# Patient Record
Sex: Male | Born: 1960 | Race: White | Hispanic: No | Marital: Single | Smoking: Current every day smoker
Health system: Southern US, Community
[De-identification: ages and names within clinical notes are randomized; demographics above are authoritative.]

## PROBLEM LIST (undated history)

## (undated) DIAGNOSIS — J45909 Unspecified asthma, uncomplicated: Secondary | ICD-10-CM

## (undated) DIAGNOSIS — J219 Acute bronchiolitis, unspecified: Secondary | ICD-10-CM

## (undated) DIAGNOSIS — R0602 Shortness of breath: Secondary | ICD-10-CM

## (undated) DIAGNOSIS — J449 Chronic obstructive pulmonary disease, unspecified: Secondary | ICD-10-CM

## (undated) DIAGNOSIS — J439 Emphysema, unspecified: Secondary | ICD-10-CM

## (undated) HISTORY — PX: CYST EXCISION: SHX5701

## (undated) HISTORY — DX: Acute bronchiolitis, unspecified: J21.9

## (undated) HISTORY — DX: Emphysema, unspecified: J43.9

---

## 2002-04-12 ENCOUNTER — Emergency Department (HOSPITAL_COMMUNITY): Admission: EM | Admit: 2002-04-12 | Discharge: 2002-04-12 | Payer: Self-pay | Admitting: Emergency Medicine

## 2002-04-12 ENCOUNTER — Encounter: Payer: Self-pay | Admitting: Emergency Medicine

## 2005-05-27 ENCOUNTER — Ambulatory Visit (HOSPITAL_COMMUNITY): Admission: RE | Admit: 2005-05-27 | Discharge: 2005-05-27 | Payer: Self-pay | Admitting: Family Medicine

## 2006-10-19 ENCOUNTER — Emergency Department (HOSPITAL_COMMUNITY): Admission: EM | Admit: 2006-10-19 | Discharge: 2006-10-19 | Payer: Self-pay | Admitting: Emergency Medicine

## 2007-07-01 ENCOUNTER — Emergency Department (HOSPITAL_COMMUNITY): Admission: EM | Admit: 2007-07-01 | Discharge: 2007-07-01 | Payer: Self-pay | Admitting: Emergency Medicine

## 2007-07-01 ENCOUNTER — Encounter: Payer: Self-pay | Admitting: Orthopedic Surgery

## 2007-07-06 ENCOUNTER — Ambulatory Visit: Payer: Self-pay | Admitting: Orthopedic Surgery

## 2007-07-06 DIAGNOSIS — S62639B Displaced fracture of distal phalanx of unspecified finger, initial encounter for open fracture: Secondary | ICD-10-CM

## 2007-07-09 ENCOUNTER — Ambulatory Visit: Payer: Self-pay | Admitting: Orthopedic Surgery

## 2007-08-10 ENCOUNTER — Ambulatory Visit: Payer: Self-pay | Admitting: Orthopedic Surgery

## 2008-10-04 ENCOUNTER — Emergency Department (HOSPITAL_COMMUNITY): Admission: EM | Admit: 2008-10-04 | Discharge: 2008-10-04 | Payer: Self-pay | Admitting: Emergency Medicine

## 2008-10-06 ENCOUNTER — Emergency Department (HOSPITAL_COMMUNITY): Admission: EM | Admit: 2008-10-06 | Discharge: 2008-10-06 | Payer: Self-pay | Admitting: Emergency Medicine

## 2010-03-02 ENCOUNTER — Emergency Department (HOSPITAL_COMMUNITY): Admission: EM | Admit: 2010-03-02 | Discharge: 2010-03-02 | Payer: Self-pay | Admitting: Emergency Medicine

## 2010-07-14 ENCOUNTER — Encounter: Payer: Self-pay | Admitting: Family Medicine

## 2010-09-06 LAB — URINALYSIS, ROUTINE W REFLEX MICROSCOPIC
Glucose, UA: NEGATIVE mg/dL
Ketones, ur: NEGATIVE mg/dL
Nitrite: NEGATIVE
Specific Gravity, Urine: 1.005 (ref 1.005–1.030)
pH: 6.5 (ref 5.0–8.0)

## 2010-09-06 LAB — URINE MICROSCOPIC-ADD ON

## 2011-02-01 ENCOUNTER — Emergency Department (HOSPITAL_COMMUNITY)
Admission: EM | Admit: 2011-02-01 | Discharge: 2011-02-01 | Disposition: A | Discharge status: Home / Self Care | Payer: Self-pay | Attending: Emergency Medicine | Admitting: Emergency Medicine

## 2011-02-01 ENCOUNTER — Emergency Department (HOSPITAL_COMMUNITY): Payer: Self-pay

## 2011-02-01 DIAGNOSIS — IMO0002 Reserved for concepts with insufficient information to code with codable children: Secondary | ICD-10-CM | POA: Insufficient documentation

## 2011-02-01 DIAGNOSIS — S2239XA Fracture of one rib, unspecified side, initial encounter for closed fracture: Secondary | ICD-10-CM | POA: Insufficient documentation

## 2011-02-01 DIAGNOSIS — F17200 Nicotine dependence, unspecified, uncomplicated: Secondary | ICD-10-CM | POA: Insufficient documentation

## 2011-02-01 MED ORDER — OXYCODONE-ACETAMINOPHEN 5-325 MG PO TABS: 2.0000 | ORAL_TABLET | ORAL | Status: AC | PRN

## 2011-02-01 MED ORDER — HYDROMORPHONE HCL 2 MG/ML IJ SOLN
2.0000 mg | Freq: Once | INTRAMUSCULAR | Status: AC
  Administered 2011-02-01: 2 mg via INTRAMUSCULAR
  Filled 2011-02-01: qty 1

## 2011-02-01 MED ORDER — ALBUTEROL SULFATE HFA 108 (90 BASE) MCG/ACT IN AERS: 2.0000 | INHALATION_SPRAY | RESPIRATORY_TRACT | Status: DC | PRN

## 2011-02-01 MED ORDER — ALBUTEROL SULFATE HFA 108 (90 BASE) MCG/ACT IN AERS
2.0000 | INHALATION_SPRAY | Freq: Once | RESPIRATORY_TRACT | Status: AC
  Administered 2011-02-01: 2 via RESPIRATORY_TRACT
  Filled 2011-02-01: qty 6.7
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
## 2011-03-07 ENCOUNTER — Emergency Department (HOSPITAL_COMMUNITY): Payer: Medicaid Other

## 2011-03-07 ENCOUNTER — Other Ambulatory Visit: Payer: Self-pay

## 2011-03-07 ENCOUNTER — Emergency Department (HOSPITAL_COMMUNITY)
Admission: EM | Admit: 2011-03-07 | Discharge: 2011-03-07 | Disposition: A | Discharge status: Home / Self Care | Payer: Medicaid Other | Attending: Emergency Medicine | Admitting: Emergency Medicine

## 2011-03-07 ENCOUNTER — Encounter (HOSPITAL_COMMUNITY): Payer: Self-pay | Admitting: Emergency Medicine

## 2011-03-07 DIAGNOSIS — R059 Cough, unspecified: Secondary | ICD-10-CM | POA: Insufficient documentation

## 2011-03-07 DIAGNOSIS — I4510 Unspecified right bundle-branch block: Secondary | ICD-10-CM | POA: Insufficient documentation

## 2011-03-07 DIAGNOSIS — R079 Chest pain, unspecified: Secondary | ICD-10-CM | POA: Insufficient documentation

## 2011-03-07 DIAGNOSIS — R0602 Shortness of breath: Secondary | ICD-10-CM | POA: Insufficient documentation

## 2011-03-07 DIAGNOSIS — Z87891 Personal history of nicotine dependence: Secondary | ICD-10-CM | POA: Insufficient documentation

## 2011-03-07 DIAGNOSIS — R05 Cough: Secondary | ICD-10-CM | POA: Insufficient documentation

## 2011-03-07 MED ORDER — OXYCODONE-ACETAMINOPHEN 5-325 MG PO TABS: 2.0000 | ORAL_TABLET | ORAL | Status: AC | PRN

## 2011-03-07 MED ORDER — KETOROLAC TROMETHAMINE 60 MG/2ML IM SOLN
60.0000 mg | Freq: Once | INTRAMUSCULAR | Status: AC
  Administered 2011-03-07: 60 mg via INTRAMUSCULAR
  Filled 2011-03-07: qty 2

## 2011-03-07 NOTE — ED Notes (Signed)
Pt medicated for pain. D/c instructions administered. NAD.
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
## 2011-07-18 ENCOUNTER — Encounter (HOSPITAL_COMMUNITY): Payer: Self-pay

## 2011-07-18 ENCOUNTER — Emergency Department (HOSPITAL_COMMUNITY): Payer: Medicaid Other

## 2011-07-18 ENCOUNTER — Emergency Department (HOSPITAL_COMMUNITY)
Admission: EM | Admit: 2011-07-18 | Discharge: 2011-07-18 | Disposition: A | Discharge status: Home / Self Care | Payer: Medicaid Other | Attending: Emergency Medicine | Admitting: Emergency Medicine

## 2011-07-18 DIAGNOSIS — J441 Chronic obstructive pulmonary disease with (acute) exacerbation: Secondary | ICD-10-CM | POA: Insufficient documentation

## 2011-07-18 DIAGNOSIS — F17200 Nicotine dependence, unspecified, uncomplicated: Secondary | ICD-10-CM | POA: Insufficient documentation

## 2011-07-18 DIAGNOSIS — IMO0001 Reserved for inherently not codable concepts without codable children: Secondary | ICD-10-CM

## 2011-07-18 MED ORDER — KETOROLAC TROMETHAMINE 30 MG/ML IJ SOLN
30.0000 mg | Freq: Once | INTRAMUSCULAR | Status: AC
  Administered 2011-07-18: 30 mg via INTRAMUSCULAR
  Filled 2011-07-18: qty 1

## 2011-07-18 MED ORDER — ALBUTEROL SULFATE HFA 108 (90 BASE) MCG/ACT IN AERS
1.0000 | INHALATION_SPRAY | Freq: Four times a day (QID) | RESPIRATORY_TRACT | Status: DC | PRN
  Administered 2011-07-18: 2 via RESPIRATORY_TRACT
  Filled 2011-07-18: qty 6.7

## 2011-07-18 MED ORDER — IPRATROPIUM BROMIDE 0.02 % IN SOLN
0.5000 mg | Freq: Once | RESPIRATORY_TRACT | Status: AC
  Administered 2011-07-18: 0.5 mg via RESPIRATORY_TRACT

## 2011-07-18 MED ORDER — GUAIFENESIN-CODEINE 100-10 MG/5ML PO SYRP: 5.0000 mL | ORAL_SOLUTION | Freq: Three times a day (TID) | ORAL | Status: AC | PRN

## 2011-07-18 MED ORDER — PREDNISONE 20 MG PO TABS: 40.0000 mg | ORAL_TABLET | Freq: Every day | ORAL | Status: AC

## 2011-07-18 MED ORDER — PREDNISONE 20 MG PO TABS
40.0000 mg | ORAL_TABLET | Freq: Once | ORAL | Status: AC
  Administered 2011-07-18: 40 mg via ORAL
  Filled 2011-07-18: qty 2

## 2011-07-18 MED ORDER — ALBUTEROL SULFATE (5 MG/ML) 0.5% IN NEBU
5.0000 mg | INHALATION_SOLUTION | Freq: Once | RESPIRATORY_TRACT | Status: AC
  Administered 2011-07-18: 5 mg via RESPIRATORY_TRACT
  Filled 2011-07-18: qty 0.5

## 2011-07-18 MED ORDER — IPRATROPIUM BROMIDE 0.02 % IN SOLN
0.5000 mg | Freq: Once | RESPIRATORY_TRACT | Status: AC
  Administered 2011-07-18: 0.5 mg via RESPIRATORY_TRACT
  Filled 2011-07-18: qty 2.5

## 2011-07-18 MED ORDER — IPRATROPIUM-ALBUTEROL 18-103 MCG/ACT IN AERO
1.0000 | INHALATION_SPRAY | Freq: Four times a day (QID) | RESPIRATORY_TRACT | Status: DC
  Administered 2011-07-18: 1 via RESPIRATORY_TRACT
  Filled 2011-07-18: qty 14.7

## 2011-07-18 MED ORDER — ALBUTEROL SULFATE (5 MG/ML) 0.5% IN NEBU
2.5000 mg | INHALATION_SOLUTION | Freq: Once | RESPIRATORY_TRACT | Status: AC
  Administered 2011-07-18: 2.5 mg via RESPIRATORY_TRACT
  Filled 2011-07-18: qty 0.5

## 2011-07-18 NOTE — ED Notes (Signed)
IV d/c'd to left AC, catheter intact, site WNL, pt states that EMS had placed IV en route

## 2011-07-18 NOTE — ED Notes (Signed)
Pt states [facility] to be eval for a cough he has had for weeks. Was sent here for further eval. Pt also complain of low back pain from cough

## 2011-07-18 NOTE — ED Provider Notes (Signed)
History   This chart was scribed for Gavin Pound. Branden Vine, MD scribed by Magnus Sinning. The patient was seen in room APA18/APA18 seen at 926    CSN: [id number]  Arrival date & time 07/18/11  0909   First MD Initiated Contact with Patient 07/18/11 [phone]      Chief Complaint  Patient presents with  . Shortness of Breath    (Consider location/radiation/quality/duration/timing/severity/associated sxs/prior treatment) HPI Tony [name] is a 51 y.o. male who presents to the Emergency Department complaining of gradually worsening moderate SOB, onset a couple weeks ago with associated productive cough, fever, chills, diaphoresis, weight loss, neck pain, and back pain. Pt denies vomiting, or appetite changes. He says he has had difficulty sleeping due to SOB and explains that he has had mild SOB for several months, but that it has gotten worse within the last week. Explains SOB is aggravated with activity and movement. Pt also notes a knot located on the right posterior neck that he states he has had since he was 18. Pt says that it was removed and went away, but has since come back. Denies that he's ever been told that he has COPD, Emphysema , or asthma. He states he smokes one pack a day and is an occasional drinker.   History reviewed. No pertinent past medical history.  History reviewed. No pertinent past surgical history.  Family History  Problem Relation Age of Onset  . COPD Brother     History  Substance Use Topics  . Smoking status: Current Everyday Smoker -- 1.0 packs/day    Types: Cigarettes  . Smokeless tobacco: Not on file  . Alcohol Use: 0.0 oz/week     very occasional      Review of Systems 10 Systems reviewed and are negative for acute change except as noted in the HPI. Allergies  Review of patient's allergies indicates no known allergies.  Home Medications   Current Outpatient Rx  Name Route Sig Dispense Refill  . GUAIFENESIN-CODEINE 100-10 MG/5ML PO SYRP Oral Take  5 mLs by mouth 3 (three) times daily as needed for cough. 120 mL 0    BP 122/88  Pulse 78  Temp(Src) 97.8 F (36.6 C) (Oral)  Resp 22  Ht 5\' 6"  (1.676 m)  Wt 144 lb (65.318 kg)  BMI 23.24 kg/m2  SpO2 98%  Physical Exam  Nursing note and vitals reviewed. Constitutional: He is oriented to person, place, and time. He appears well-developed and well-nourished. No distress.  HENT:  Head: Normocephalic and atraumatic.  Eyes: EOM are normal. Pupils are equal, round, and reactive to light.  Neck: Normal range of motion. Neck supple. No tracheal deviation present.       Soft round tissue about 2.5 cm in diameter on posterior right neck. It is not red. It is not indurated and does not feel fluctuant.   Cardiovascular: Normal rate, regular rhythm and normal heart sounds.   Pulmonary/Chest: Effort normal. No respiratory distress. He has wheezes.       Very little air movement diffusely. Moving some air in the left upper lobe. Accessory muscle use and pt tachypneic   Abdominal: Soft. He exhibits no distension.  Musculoskeletal: Normal range of motion. He exhibits no edema.  Neurological: He is alert and oriented to person, place, and time. No sensory deficit.  Skin: Skin is warm and dry.  Psychiatric: He has a normal mood and affect. His behavior is normal.    ED Course  Procedures (including critical care time)  DIAGNOSTIC STUDIES: Oxygen Saturation is 98% on room air, normal by my interpretation.    COORDINATION OF CARE: 9:45- Pt given albuterol (5 MG/ML) 0.5% nebulizer solution 2.5 mg 10:00- Pt given predniSONE  tablet 40 mg, ipratropium nebulizer solution 0.5  10:20- Pt given ipratropium  nebulizer solution 0.5 mg and albuterol  0.5% nebulizer solution 5 mg  10:26- Pt given albuterol inhaler 1-2 puff  10:45-Pt given ketorolac 30 MG/ML injection 30 mg 12:00- Pt given albuterol-ipratropium inhaler 1 puff   Labs Reviewed - No data to display Dg Chest 2 View  07/18/2011  *RADIOLOGY  REPORT*  Clinical Data: Shortness of breath, smoker  CHEST - 2 VIEW  Comparison: 03/07/2011  Findings: Normal heart size, mediastinal contours, and pulmonary vascularity. Emphysematous and bronchitic changes. No pulmonary infiltrate, pleural effusion, or pneumothorax. Old fracture posterolateral right 9th rib fracture. Bones demineralized. Question left nipple shadow.  IMPRESSION: Emphysematous and bronchitic changes compatible with COPD. Question left nipple shadow; recommend repeat PA chest radiograph with nipple markers to exclude pulmonary nodule.  Original Report Authenticated By: Lollie Marrow, M.D.   Dg Chest Special View  07/18/2011  *RADIOLOGY REPORT*  Clinical Data: Repeat imaging with nipple markers performed  CHEST SPECIAL VIEW  Comparison: 07/18/2011  Findings: Heart size is normal.  No pleural effusion or pulmonary edema.  Interstitial changes of COPD identified.  The nodular density in the left base does not correspond to nipple artifact.  This is indeterminate.  No airspace consolidation identified.  IMPRESSION:  1.  Indeterminate nodular density in the left base which does not correspond to nipple artifact.  More definitive assessment with noncontrast CT of the chest advised.  These results will be called to the ordering clinician or representative by the Radiologist Assistant, and communication documented in the PACS Dashboard.  Original Report Authenticated By: Rosealee Albee, M.D.     1. COPD bronchitis       MDM  I personally performed the services described in this documentation, which was scribed in my presence. The recorded information has been reviewed and considered.     Patient likely with undiagnosed COPD. Patient reports that he has little money and no insurance therefore his never had a primary care physician. He has been a one to 2 pack per day smoker since age of 83. He reports that he has felt short of breath progressively worse for several months, but has gotten  worse the last few days. He reports coughing as gotten worse with occasional yellow or greenish sputum production. He denies night sweats or obvious weight loss. He denies any significant chest pain other than when he is coughing. He denies any poor appetite. He has felt hot and cold with occasional sweats so endorses some subjective fevers. My plan is to administer 2 or 3 that it was her treatments, oral steroids, and make sure that he does have inhalers for home. His room air saturations are 98% which is normal. The chest x-ray which I also reviewed per the radiologist shows bronchitic changes compatible with COPD. He does recommend a repeat with nipple markers to make sure that there are no nodules there. Otherwise if his symptoms seem improved after steroids and breathing treatments, I feel that he would be stable for discharge to home. I will refer him to Dr. Lodema Hong who is on call for medical. I do feel that he requires at least one close followup appointment to ensure that this acute exacerbation is improving and that he may have the  opportunity to establish with a local internist for ongoing primary care.     11:20 AM  Pt feels much imrpoved.  Will d/c home.    Gavin Pound. Theressa Piedra, MD 07/18/11 1120

## 2011-07-18 NOTE — Discharge Instructions (Signed)
Bronchitis Bronchitis is the body's way of reacting to injury and/or infection (inflammation) of the bronchi. Bronchi are the air tubes that extend from the windpipe into the lungs. If the inflammation becomes severe, it may cause shortness of breath. CAUSES  Inflammation may be caused by:  A virus.   Germs (bacteria).   Dust.   Allergens.   Pollutants and many other irritants.  The cells lining the bronchial tree are covered with tiny hairs (cilia). These constantly beat upward, away from the lungs, toward the mouth. This keeps the lungs free of pollutants. When these cells become too irritated and are unable to do their job, mucus begins to develop. This causes the characteristic cough of bronchitis. The cough clears the lungs when the cilia are unable to do their job. Without either of these protective mechanisms, the mucus would settle in the lungs. Then you would develop pneumonia. Smoking is a common cause of bronchitis and can contribute to pneumonia. Stopping this habit is the single most important thing you can do to help yourself. TREATMENT   Your caregiver may prescribe an antibiotic if the cough is caused by bacteria. Also, medicines that open up your airways make it easier to breathe. Your caregiver may also recommend or prescribe an expectorant. It will loosen the mucus to be coughed up. Only take over-the-counter or prescription medicines for pain, discomfort, or fever as directed by your caregiver.   Removing whatever causes the problem (smoking, for example) is critical to preventing the problem from getting worse.   Cough suppressants may be prescribed for relief of cough symptoms.   Inhaled medicines may be prescribed to help with symptoms now and to help prevent problems from returning.   For those with recurrent (chronic) bronchitis, there may be a need for steroid medicines.  SEEK IMMEDIATE MEDICAL CARE IF:   During treatment, you develop more pus-like mucus  (purulent sputum).   You have a fever.   You become progressively more ill.   You have increased difficulty breathing, wheezing, or shortness of breath.  It is necessary to seek immediate medical care if you are elderly or sick from any other disease. MAKE SURE YOU:   Understand these instructions.   Will watch your condition.   Will get help right away if you are not doing well or get worse.  Document Released: 06/10/2005 Document Revised: 02/20/2011 Document Reviewed: 04/19/2008 [employer] Patient Information 2012 [employer] 

## 2011-07-18 NOTE — ED Notes (Signed)
Pt receiving neb treatment at this time

## 2011-07-18 NOTE — ED Notes (Signed)
Pt req pain med for back pain. edp aware. Med ordered

## 2011-10-15 ENCOUNTER — Other Ambulatory Visit (HOSPITAL_COMMUNITY): Payer: Self-pay | Admitting: *Deleted

## 2011-10-15 DIAGNOSIS — R9389 Abnormal findings on diagnostic imaging of other specified body structures: Secondary | ICD-10-CM

## 2011-10-18 ENCOUNTER — Ambulatory Visit (HOSPITAL_COMMUNITY)
Admission: RE | Admit: 2011-10-18 | Discharge: 2011-10-18 | Disposition: A | Discharge status: Home / Self Care | Payer: Medicaid Other | Source: Ambulatory Visit | Attending: *Deleted | Admitting: *Deleted

## 2011-10-18 DIAGNOSIS — R911 Solitary pulmonary nodule: Secondary | ICD-10-CM | POA: Insufficient documentation

## 2011-10-18 DIAGNOSIS — J40 Bronchitis, not specified as acute or chronic: Secondary | ICD-10-CM | POA: Insufficient documentation

## 2011-10-18 DIAGNOSIS — J438 Other emphysema: Secondary | ICD-10-CM | POA: Insufficient documentation

## 2011-10-18 DIAGNOSIS — R9389 Abnormal findings on diagnostic imaging of other specified body structures: Secondary | ICD-10-CM

## 2012-01-07 ENCOUNTER — Other Ambulatory Visit (HOSPITAL_COMMUNITY): Payer: Self-pay | Admitting: *Deleted

## 2012-01-07 DIAGNOSIS — R918 Other nonspecific abnormal finding of lung field: Secondary | ICD-10-CM

## 2012-01-07 DIAGNOSIS — R9389 Abnormal findings on diagnostic imaging of other specified body structures: Secondary | ICD-10-CM

## 2012-01-20 ENCOUNTER — Ambulatory Visit (HOSPITAL_COMMUNITY)
Admission: RE | Admit: 2012-01-20 | Discharge: 2012-01-20 | Disposition: A | Discharge status: Home / Self Care | Payer: Medicaid Other | Source: Ambulatory Visit | Attending: *Deleted | Admitting: *Deleted

## 2012-01-20 DIAGNOSIS — R9389 Abnormal findings on diagnostic imaging of other specified body structures: Secondary | ICD-10-CM

## 2012-01-20 DIAGNOSIS — R918 Other nonspecific abnormal finding of lung field: Secondary | ICD-10-CM

## 2012-01-20 DIAGNOSIS — J984 Other disorders of lung: Secondary | ICD-10-CM | POA: Insufficient documentation

## 2012-01-29 ENCOUNTER — Other Ambulatory Visit: Payer: Self-pay | Admitting: Vascular Surgery

## 2012-07-31 ENCOUNTER — Emergency Department (HOSPITAL_COMMUNITY): Payer: Medicaid Other

## 2012-07-31 ENCOUNTER — Inpatient Hospital Stay (HOSPITAL_COMMUNITY)
Admission: EM | Admit: 2012-07-31 | Discharge: 2012-08-01 | DRG: 192 | Disposition: A | Discharge status: Home / Self Care | Payer: Medicaid Other | Attending: Internal Medicine | Admitting: Internal Medicine

## 2012-07-31 ENCOUNTER — Encounter (HOSPITAL_COMMUNITY): Payer: Self-pay | Admitting: *Deleted

## 2012-07-31 ENCOUNTER — Inpatient Hospital Stay (HOSPITAL_COMMUNITY): Payer: Medicaid Other

## 2012-07-31 DIAGNOSIS — Z79899 Other long term (current) drug therapy: Secondary | ICD-10-CM

## 2012-07-31 DIAGNOSIS — R0600 Dyspnea, unspecified: Secondary | ICD-10-CM | POA: Diagnosis present

## 2012-07-31 DIAGNOSIS — IMO0002 Reserved for concepts with insufficient information to code with codable children: Secondary | ICD-10-CM

## 2012-07-31 DIAGNOSIS — J441 Chronic obstructive pulmonary disease with (acute) exacerbation: Principal | ICD-10-CM | POA: Diagnosis present

## 2012-07-31 DIAGNOSIS — R634 Abnormal weight loss: Secondary | ICD-10-CM | POA: Diagnosis present

## 2012-07-31 DIAGNOSIS — J45901 Unspecified asthma with (acute) exacerbation: Principal | ICD-10-CM | POA: Diagnosis present

## 2012-07-31 DIAGNOSIS — Z23 Encounter for immunization: Secondary | ICD-10-CM

## 2012-07-31 DIAGNOSIS — R0902 Hypoxemia: Secondary | ICD-10-CM | POA: Diagnosis present

## 2012-07-31 DIAGNOSIS — Z87891 Personal history of nicotine dependence: Secondary | ICD-10-CM

## 2012-07-31 DIAGNOSIS — E876 Hypokalemia: Secondary | ICD-10-CM | POA: Diagnosis present

## 2012-07-31 DIAGNOSIS — Z720 Tobacco use: Secondary | ICD-10-CM | POA: Diagnosis present

## 2012-07-31 DIAGNOSIS — J449 Chronic obstructive pulmonary disease, unspecified: Secondary | ICD-10-CM | POA: Diagnosis present

## 2012-07-31 HISTORY — DX: Chronic obstructive pulmonary disease, unspecified: J44.9

## 2012-07-31 HISTORY — DX: Unspecified asthma, uncomplicated: J45.909

## 2012-07-31 LAB — COMPREHENSIVE METABOLIC PANEL
ALT: 16 U/L (ref 0–53)
AST: 19 U/L (ref 0–37)
CO2: 29 mEq/L (ref 19–32)
Calcium: 9.3 mg/dL (ref 8.4–10.5)
Chloride: 96 mEq/L (ref 96–112)
GFR calc non Af Amer: >90 mL/min (ref >90)
Potassium: 2.9 mEq/L — ABNORMAL LOW (ref 3.5–5.1)
Sodium: 136 mEq/L (ref 135–145)
Total Bilirubin: 0.6 mg/dL (ref 0.3–1.2)

## 2012-07-31 LAB — CBC WITH DIFFERENTIAL/PLATELET
Basophils Absolute: 0 10*3/uL (ref 0.0–0.1)
Lymphocytes Relative: 12 % (ref 12–46)
Neutro Abs: 8.4 10*3/uL — ABNORMAL HIGH (ref 1.7–7.7)
Platelets: 296 10*3/uL (ref 150–400)
RDW: 12.3 % (ref 11.5–15.5)
WBC: 10.9 10*3/uL — ABNORMAL HIGH (ref 4.0–10.5)

## 2012-07-31 LAB — PRO B NATRIURETIC PEPTIDE: Pro B Natriuretic peptide (BNP): 134.8 pg/mL — ABNORMAL HIGH (ref 0–125)

## 2012-07-31 MED ORDER — IOHEXOL 300 MG/ML  SOLN
80.0000 mL | Freq: Once | INTRAMUSCULAR | Status: AC | PRN
  Administered 2012-07-31: 80 mL via INTRAVENOUS

## 2012-07-31 MED ORDER — MOMETASONE FURO-FORMOTEROL FUM 100-5 MCG/ACT IN AERO
2.0000 | INHALATION_SPRAY | Freq: Two times a day (BID) | RESPIRATORY_TRACT | Status: DC
Start: 2012-07-31 — End: 2012-08-01
  Administered 2012-07-31 – 2012-08-01 (×2): 2 via RESPIRATORY_TRACT
  Filled 2012-07-31: qty 8.8

## 2012-07-31 MED ORDER — IPRATROPIUM BROMIDE 0.02 % IN SOLN
0.5000 mg | Freq: Once | RESPIRATORY_TRACT | Status: AC
  Administered 2012-07-31: 0.5 mg via RESPIRATORY_TRACT
  Filled 2012-07-31: qty 2.5

## 2012-07-31 MED ORDER — INFLUENZA VIRUS VACC SPLIT PF IM SUSP
0.5000 mL | INTRAMUSCULAR | Status: AC
  Administered 2012-08-01: 0.5 mL via INTRAMUSCULAR
  Filled 2012-07-31: qty 0.5

## 2012-07-31 MED ORDER — POTASSIUM CHLORIDE 10 MEQ/100ML IV SOLN
10.0000 meq | Freq: Once | INTRAVENOUS | Status: AC
  Administered 2012-07-31: 10 meq via INTRAVENOUS
  Filled 2012-07-31: qty 100

## 2012-07-31 MED ORDER — ACETAMINOPHEN 325 MG PO TABS: 650.0000 mg | ORAL_TABLET | Freq: Four times a day (QID) | ORAL | Status: DC | PRN

## 2012-07-31 MED ORDER — HYDROCODONE-ACETAMINOPHEN 5-325 MG PO TABS: 1.0000 | ORAL_TABLET | ORAL | Status: DC | PRN

## 2012-07-31 MED ORDER — ALBUTEROL SULFATE (5 MG/ML) 0.5% IN NEBU: 2.5000 mg | INHALATION_SOLUTION | RESPIRATORY_TRACT | Status: DC | PRN

## 2012-07-31 MED ORDER — ALBUTEROL SULFATE (5 MG/ML) 0.5% IN NEBU
2.5000 mg | INHALATION_SOLUTION | Freq: Once | RESPIRATORY_TRACT | Status: AC
  Administered 2012-07-31: 2.5 mg via RESPIRATORY_TRACT
  Filled 2012-07-31: qty 0.5

## 2012-07-31 MED ORDER — POTASSIUM CHLORIDE CRYS ER 20 MEQ PO TBCR
40.0000 meq | EXTENDED_RELEASE_TABLET | ORAL | Status: AC
  Administered 2012-07-31 (×2): 40 meq via ORAL
  Filled 2012-07-31 (×2): qty 2

## 2012-07-31 MED ORDER — PNEUMOCOCCAL VAC POLYVALENT 25 MCG/0.5ML IJ INJ
0.5000 mL | INJECTION | INTRAMUSCULAR | Status: AC
  Administered 2012-08-01: 0.5 mL via INTRAMUSCULAR
  Filled 2012-07-31: qty 0.5

## 2012-07-31 MED ORDER — METHYLPREDNISOLONE SODIUM SUCC 125 MG IJ SOLR
125.0000 mg | Freq: Once | INTRAMUSCULAR | Status: AC
  Administered 2012-07-31: 125 mg via INTRAVENOUS
  Filled 2012-07-31: qty 2

## 2012-07-31 MED ORDER — ACETAMINOPHEN 650 MG RE SUPP: 650.0000 mg | Freq: Four times a day (QID) | RECTAL | Status: DC | PRN

## 2012-07-31 MED ORDER — ALBUTEROL SULFATE (5 MG/ML) 0.5% IN NEBU
2.5000 mg | INHALATION_SOLUTION | Freq: Four times a day (QID) | RESPIRATORY_TRACT | Status: DC
  Administered 2012-07-31 – 2012-08-01 (×2): 2.5 mg via RESPIRATORY_TRACT
  Filled 2012-07-31 (×3): qty 0.5

## 2012-07-31 MED ORDER — BENZONATATE 100 MG PO CAPS: 200.0000 mg | ORAL_CAPSULE | Freq: Three times a day (TID) | ORAL | Status: DC | PRN

## 2012-07-31 MED ORDER — METHYLPREDNISOLONE SODIUM SUCC 125 MG IJ SOLR
60.0000 mg | Freq: Four times a day (QID) | INTRAMUSCULAR | Status: DC
  Administered 2012-07-31 – 2012-08-01 (×3): 60 mg via INTRAVENOUS
  Filled 2012-07-31 (×3): qty 2

## 2012-07-31 MED ORDER — ONDANSETRON HCL 4 MG PO TABS: 4.0000 mg | ORAL_TABLET | Freq: Four times a day (QID) | ORAL | Status: DC | PRN

## 2012-07-31 MED ORDER — GUAIFENESIN ER 600 MG PO TB12
600.0000 mg | ORAL_TABLET | Freq: Two times a day (BID) | ORAL | Status: DC
  Administered 2012-07-31 – 2012-08-01 (×2): 600 mg via ORAL
  Filled 2012-07-31 (×2): qty 1

## 2012-07-31 MED ORDER — POTASSIUM CHLORIDE CRYS ER 20 MEQ PO TBCR
40.0000 meq | EXTENDED_RELEASE_TABLET | Freq: Once | ORAL | Status: AC
  Administered 2012-07-31: 40 meq via ORAL
  Filled 2012-07-31: qty 2

## 2012-07-31 MED ORDER — IPRATROPIUM BROMIDE 0.02 % IN SOLN
RESPIRATORY_TRACT | Status: AC
  Administered 2012-07-31: 0.5 mg
  Filled 2012-07-31: qty 2.5

## 2012-07-31 MED ORDER — IPRATROPIUM BROMIDE 0.02 % IN SOLN
0.5000 mg | Freq: Four times a day (QID) | RESPIRATORY_TRACT | Status: DC
  Administered 2012-07-31 – 2012-08-01 (×2): 0.5 mg via RESPIRATORY_TRACT
  Filled 2012-07-31 (×3): qty 2.5

## 2012-07-31 MED ORDER — ONDANSETRON HCL 4 MG/2ML IJ SOLN: 4.0000 mg | Freq: Four times a day (QID) | INTRAMUSCULAR | Status: DC | PRN

## 2012-07-31 MED ORDER — ALBUTEROL SULFATE (5 MG/ML) 0.5% IN NEBU
INHALATION_SOLUTION | RESPIRATORY_TRACT | Status: AC
  Filled 2012-07-31: qty 0.5

## 2012-07-31 MED ORDER — LEVOFLOXACIN IN D5W 750 MG/150ML IV SOLN
750.0000 mg | INTRAVENOUS | Status: DC
  Administered 2012-07-31: 750 mg via INTRAVENOUS
  Filled 2012-07-31 (×3): qty 150

## 2012-07-31 MED ORDER — ALBUTEROL SULFATE (5 MG/ML) 0.5% IN NEBU
2.5000 mg | INHALATION_SOLUTION | RESPIRATORY_TRACT | Status: DC | PRN
  Administered 2012-07-31: 2.5 mg via RESPIRATORY_TRACT

## 2012-07-31 MED ORDER — ENOXAPARIN SODIUM 40 MG/0.4ML ~~LOC~~ SOLN
40.0000 mg | SUBCUTANEOUS | Status: DC
  Administered 2012-07-31: 40 mg via SUBCUTANEOUS
  Filled 2012-07-31: qty 0.4

## 2012-07-31 NOTE — ED Notes (Signed)
Pt able to ambulate only a few feet before becoming very short of breath and sat dropping to 87 % on room air. Pt states he is not on 02 at home

## 2012-07-31 NOTE — ED Provider Notes (Signed)
History   This chart was scribed for Dione Booze, MD by Charolett Bumpers, ED Scribe. The patient was seen in room APA14/[facility]14. Patient's care was started at 0924.   CSN: [id number]  Arrival date & time 07/31/12  0920   First MD Initiated Contact with Patient 07/31/12 [phone]      Chief Complaint  Patient presents with  . Shortness of Breath    The history is provided by the patient. No language interpreter was used.   Tony [name] is a 52 y.o. male who has a h/o COPD presents to the Emergency Department complaining of persistent, severe SOB. He states that he has been having difficulty breathing for the past 2 months with his symptoms worsening over the past couple of weeks. He reports an associated poductive cough with white phlegm, fever, chills, diaphoresis and mid sternal chest pain. He rates his chest pain 9/10. His symptoms are aggravated with coughing. He has has used an advair and albuterol inhaler without relief. He has also used a nebulizer treatment at home with some relief. He was seen by the health department yesterday and started on zithromax.   No PCP  Past Medical History  Diagnosis Date  . COPD (chronic obstructive pulmonary disease)     History reviewed. No pertinent past surgical history.  Family History  Problem Relation Age of Onset  . COPD Brother     History  Substance Use Topics  . Smoking status: Former Smoker -- 1.0 packs/day    Types: Cigarettes  . Smokeless tobacco: Not on file  . Alcohol Use: No      Review of Systems  Constitutional: Positive for fever, chills and diaphoresis.  Respiratory: Positive for cough and shortness of breath.   Cardiovascular: Positive for chest pain. Negative for leg swelling.  All other systems reviewed and are negative.    Allergies  Review of patient's allergies indicates no known allergies.  Home Medications  No current outpatient prescriptions on file.  Triage Vitals: BP 124/85  Temp 98.1 F  (36.7 C) (Oral)  Resp 26  Ht 5\' 3"  (1.6 m)  Wt 128 lb (58.06 kg)  BMI 22.67 kg/m2  SpO2 97%  Physical Exam  Nursing note and vitals reviewed. Constitutional: He is oriented to person, place, and time. He appears well-developed and well-nourished. No distress.  HENT:  Head: Normocephalic and atraumatic.  Eyes: Conjunctivae normal and EOM are normal. Pupils are equal, round, and reactive to light.  Neck: Normal range of motion. Neck supple. No tracheal deviation present.  Cardiovascular: Normal rate, regular rhythm and normal heart sounds.  Exam reveals no gallop and no friction rub.   No murmur heard. Pulmonary/Chest: Accessory muscle usage present. No respiratory distress. He has wheezes. He has no rhonchi. He has no rales.       Decreased air flow. Prolonged exhalation phase. Slight wheezing. Slight use of accessory muscles.   Abdominal: Soft. Bowel sounds are normal. He exhibits no distension. There is no tenderness.  Musculoskeletal: Normal range of motion. He exhibits no edema and no tenderness.  Neurological: He is alert and oriented to person, place, and time.  Skin: Skin is warm and dry.  Psychiatric: He has a normal mood and affect. His behavior is normal.    ED Course  Procedures (including critical care time)  DIAGNOSTIC STUDIES: Oxygen Saturation is 97% on room air, adequate by my interpretation.    COORDINATION OF CARE:  09:40-Discussed planned course of treatment with the patient including a  chest x-ray, breathing treatment, steroids, and basic blood work, who is agreeable at this time.   09:45-Medication Orders: Methylprednisolone sodium succinate (Solu-medrol) 125 mg/2 mL injection 125 mg-once; Albuterol (Proventil) (5 mg/mL) 0.5% nebulizer solution 2.5 mg-once; Ipratropium (Atrovent) nebulizer solution 0.5 mg-once.  Results for orders placed during the hospital encounter of 07/31/12  CBC WITH DIFFERENTIAL      Component Value Range   WBC 10.9 (*) 4.0 - 10.5  K/uL   RBC 4.63  4.22 - 5.81 MIL/uL   Hemoglobin 13.9  13.0 - 17.0 g/dL   HCT 96.0  45.4 - 09.8 %   MCV 87.7  78.0 - 100.0 fL   MCH 30.0  26.0 - 34.0 pg   MCHC 34.2  30.0 - 36.0 g/dL   RDW 11.9  14.7 - 82.9 %   Platelets 296  150 - 400 K/uL   Neutrophils Relative 77  43 - 77 %   Neutro Abs 8.4 (*) 1.7 - 7.7 K/uL   Lymphocytes Relative 12  12 - 46 %   Lymphs Abs 1.3  0.7 - 4.0 K/uL   Monocytes Relative 10  3 - 12 %   Monocytes Absolute 1.0  0.1 - 1.0 K/uL   Eosinophils Relative 1  0 - 5 %   Eosinophils Absolute 0.1  0.0 - 0.7 K/uL   Basophils Relative 0  0 - 1 %   Basophils Absolute 0.0  0.0 - 0.1 K/uL  COMPREHENSIVE METABOLIC PANEL      Component Value Range   Sodium 136  135 - 145 mEq/L   Potassium 2.9 (*) 3.5 - 5.1 mEq/L   Chloride 96  96 - 112 mEq/L   CO2 29  19 - 32 mEq/L   Glucose, Bld 124 (*) 70 - 99 mg/dL   BUN 4 (*) 6 - 23 mg/dL   Creatinine, Ser 5.62  0.50 - 1.35 mg/dL   Calcium 9.3  8.4 - 13.0 mg/dL   Total Protein 7.7  6.0 - 8.3 g/dL   Albumin 3.5  3.5 - 5.2 g/dL   AST 19  0 - 37 U/L   ALT 16  0 - 53 U/L   Alkaline Phosphatase 58  39 - 117 U/L   Total Bilirubin 0.6  0.3 - 1.2 mg/dL   GFR calc non Af Amer >90  >90 mL/min   GFR calc Af Amer >90  >90 mL/min  TROPONIN I      Component Value Range   Troponin I <0.30  <0.30 ng/mL    Dg Chest 2 View  07/31/2012  *RADIOLOGY REPORT*  Clinical Data: Shortness of breath.  CHEST - 2 VIEW  Comparison: Oct 31, 2011.  Findings: Hyperexpansion of the lungs is noted consistent with chronic obstructive pulmonary disease.  Mild scarring is noted throughout both lungs which is unchanged.  Cardiomediastinal silhouette appears normal. There is interval development of ill- defined density seen in the left lower lobe which was not clearly present on prior exam.  IMPRESSION: Findings consistent with chronic obstructive pulmonary disease. Interval development of left lower lobe ill-defined density which may represent focal inflammation, but  chest CT is recommended to rule out neoplasm or malignancy.   Original Report Authenticated By: Lupita Raider.,  M.D.      Date: 07/31/2012  Rate: 76  Rhythm: normal sinus rhythm and sinus arrhythmia  QRS Axis: normal  Intervals: normal  ST/T Wave abnormalities: normal  Conduction Disutrbances:none  Narrative Interpretation: Sinus arrhythmia. No prior ECG available for  comparison.  Old EKG Reviewed: none available    1. COPD exacerbation   2. Hypokalemia       MDM  Exacerbation of COPD. He'll be given steroids, albuterol with Atrovent nebulizer treatments, and reassessed.  He had relatively little improvement with 3 albuterol and Atrovent nebulizer treatments. He also received methylprednisolone. There is still slight wheezing noted and L1 ambulating he got dyspneic with walking about 30 feet and oxygen saturations dropped to 87%. He will need to be admitted. Case is discussed with Dr. Kerry Hough of [facility] who agrees to admit the patient.  I personally performed the services described in this documentation, which was scribed in my presence. The recorded information has been reviewed and is accurate.         Dione Booze, MD 07/31/12 1430

## 2012-07-31 NOTE — ED Notes (Signed)
C/o difficulty breathing, shortness of breath "for awhile".  Reports hx of COPD - was seen at [facility] yesterday and given Rx zithromax, Advair and albuterol inhaler for same.  Pt states he also uses neb tx at home with no relief.

## 2012-07-31 NOTE — H&P (Signed)
[facility] History and Physical  Tony [name] BJY:[id number] DOB: [birth year] DOA: 07/31/2012  Referring physician: Dr. Preston Fleeting PCP: No primary provider on file. [facility] Specialists:   Chief Complaint: Shortness of breath  HPI: Tony [name] is a 52 y.o. male presents to the emergency room with shortness of breath. Patient reports having shortness of breath the last few months. He reports significant increase over the past week. He is having increasing difficulty doing his daily activities. He is having trouble walking around his home. He reports that when he is at rest, he does not have any shortness of breath. This is only on exertion. He has a chronic cough and has pleuritic central chest pain, which is worse with cough. He does been off white frothy sputum. He is unsure whether he has had any fever. He denies any orthopnea or paroxysmal nocturnal dyspnea. Denies any pedal edema. He reports that he has lost approximately 20 pounds over the past year. He is a chronic smoker and recently quit smoking approximately 7 days ago. He was evaluated in the emergency room and was noted to be hypoxic on ambulation. He's been referred for admission.  Review of Systems: Pertinent positives as per history of present illness, otherwise negative  Past Medical History  Diagnosis Date  . COPD (chronic obstructive pulmonary disease)   . Asthma    History reviewed. No pertinent past surgical history. Social History:  reports that he quit smoking 7 days ago. His smoking use included Cigarettes. He smoked 1 pack per day. He does not have any smokeless tobacco history on file. He reports that he does not drink alcohol or use illicit drugs. Lives alone  Allergies  Allergen Reactions  . Bee Venom Anaphylaxis    Family History  Problem Relation Age of Onset  . COPD Brother    mother died of an unknown cancer.  Prior to Admission medications   Medication Sig  Start Date End Date Taking? Authorizing Provider  albuterol (PROVENTIL HFA;VENTOLIN HFA) 108 (90 BASE) MCG/ACT inhaler Inhale 2 puffs into the lungs every 6 (six) hours as needed. Shortness Of Breath   Yes Historical Provider, MD  azithromycin (ZITHROMAX) 250 MG tablet Take 250-500 mg by mouth daily. Take 500 mg on day one, then take 250 mg on days 2-5.   Yes Historical Provider, MD  benzonatate (TESSALON) 200 MG capsule Take 200 mg by mouth 3 (three) times daily as needed. Cough   Yes Historical Provider, MD  Fluticasone-Salmeterol (ADVAIR) 250-50 MCG/DOSE AEPB Inhale 1 puff into the lungs every 12 (twelve) hours.   Yes Historical Provider, MD  loratadine (CLARITIN) 10 MG tablet Take 10 mg by mouth daily as needed. Allergies   Yes Historical Provider, MD   Physical Exam: Filed Vitals:   07/31/12 1109 07/31/12 1131 07/31/12 1210 07/31/12 1519  BP:  123/76  119/88  Pulse:  97  97  Temp:      TempSrc:      Resp:  22  18  Height:      Weight:      SpO2: 96% 90% 95% 96%     General:  Patient is sitting up in bed, appears to be comfortable  Eyes: Pupils are equal round react to light  ENT: Mucous membranes are moist  Neck: Supple  Cardiovascular: S1, S2, regular rate and rhythm, no pedal edema bilaterally  Respiratory: Diminished breath sounds bilaterally  Abdomen: Soft, nontender, nondistended, bowel sounds are active  Skin:  Normal  Musculoskeletal: Deferred  Psychiatric: Normal affect, cooperative exam  Neurologic: Grossly intact, nonfocal  Labs on Admission:  Basic Metabolic Panel:  Lab 07/31/12 2130  NA 136  K 2.9*  CL 96  CO2 29  GLUCOSE 124*  BUN 4*  CREATININE 0.76  CALCIUM 9.3  MG --  PHOS --   Liver Function Tests:  Lab 07/31/12 0931  AST 19  ALT 16  ALKPHOS 58  BILITOT 0.6  PROT 7.7  ALBUMIN 3.5   No results found for this basename: LIPASE:5,AMYLASE:5 in the last 168 hours No results found for this basename: AMMONIA:5 in the last 168  hours CBC:  Lab 07/31/12 0931  WBC 10.9*  NEUTROABS 8.4*  HGB 13.9  HCT 40.6  MCV 87.7  PLT 296   Cardiac Enzymes:  Lab 07/31/12 0931  CKTOTAL --  CKMB --  CKMBINDEX --  TROPONINI <0.30    BNP (last 3 results) No results found for this basename: PROBNP:3 in the last 8760 hours CBG: No results found for this basename: GLUCAP:5 in the last 168 hours  Radiological Exams on Admission: Dg Chest 2 View  07/31/2012  *RADIOLOGY REPORT*  Clinical Data: Shortness of breath.  CHEST - 2 VIEW  Comparison: Oct 31, 2011.  Findings: Hyperexpansion of the lungs is noted consistent with chronic obstructive pulmonary disease.  Mild scarring is noted throughout both lungs which is unchanged.  Cardiomediastinal silhouette appears normal. There is interval development of ill- defined density seen in the left lower lobe which was not clearly present on prior exam.  IMPRESSION: Findings consistent with chronic obstructive pulmonary disease. Interval development of left lower lobe ill-defined density which may represent focal inflammation, but chest CT is recommended to rule out neoplasm or malignancy.   Original Report Authenticated By: Lupita Raider.,  M.D.     EKG: Independently reviewed. No acute changes  Assessment/Plan Active Problems:  COPD exacerbation  Hypokalemia  Dyspnea  Tobacco use  Weight loss   1. COPD exacerbation. Patient's shortness of breath is likely related to a COPD exacerbation. He will be given steroids and nebulizer treatments as well as antibiotics. He may need to be discharged home with oxygen. We will reassess this tomorrow. There was some mention of some haziness on chest x-[name] left lower lobe. As we further evaluated by CT. Will also check BNP 2. Hypokalemia. Replace 3. Tobacco use. Patient recently stopped smoking approximately 7 days ago. He was congratulated on his accomplishment 4. Weight loss. Patient is undergoing CT to rule out any underlying malignancy, we'll  also check TSH.    Code Status: Full code Family Communication: Discussed with patient, no family in the room Disposition Plan: Discharge home once improved  Time spent: 60 minutes  [name] [facility] Pager [phone]  If 7PM-7AM, please contact night-coverage [email or web address] Password [facility] 07/31/2012, 4:46 PM

## 2012-08-01 DIAGNOSIS — R0989 Other specified symptoms and signs involving the circulatory and respiratory systems: Secondary | ICD-10-CM

## 2012-08-01 DIAGNOSIS — F17200 Nicotine dependence, unspecified, uncomplicated: Secondary | ICD-10-CM

## 2012-08-01 DIAGNOSIS — J441 Chronic obstructive pulmonary disease with (acute) exacerbation: Secondary | ICD-10-CM

## 2012-08-01 DIAGNOSIS — E876 Hypokalemia: Secondary | ICD-10-CM

## 2012-08-01 LAB — MAGNESIUM: Magnesium: 2 mg/dL (ref 1.5–2.5)

## 2012-08-01 LAB — BASIC METABOLIC PANEL
CO2: 23 mEq/L (ref 19–32)
Calcium: 9.6 mg/dL (ref 8.4–10.5)
Creatinine, Ser: 0.63 mg/dL (ref 0.50–1.35)
GFR calc Af Amer: >90 mL/min (ref >90)
GFR calc non Af Amer: >90 mL/min (ref >90)

## 2012-08-01 LAB — TSH: TSH: 0.531 u[IU]/mL (ref 0.350–4.500)

## 2012-08-01 MED ORDER — PREDNISONE 10 MG PO TABS: ORAL_TABLET | ORAL | Status: DC

## 2012-08-01 MED ORDER — GUAIFENESIN ER 600 MG PO TB12: 600.0000 mg | ORAL_TABLET | Freq: Two times a day (BID) | ORAL | Status: DC

## 2012-08-01 MED ORDER — LEVOFLOXACIN 500 MG PO TABS: 500.0000 mg | ORAL_TABLET | Freq: Every day | ORAL | Status: DC

## 2012-08-01 NOTE — Plan of Care (Signed)
Problem: Discharge Progression Outcomes Goal: O2 sats > or equal 90% or at baseline Outcome: Completed/Met Date Met:  08/01/12 (% % after ambulation without O2 Goal: Flu vaccine received if indicated Outcome: Completed/Met Date Met:  08/01/12 08/01/12 Goal: Pneumonia vaccine received if indicated Outcome: Completed/Met Date Met:  08/01/12 08/01/12 Goal: Pain controlled with appropriate interventions Outcome: Completed/Met Date Met:  08/01/12 Denies pain Goal: Other Discharge Outcomes/Goals Outcome: Completed/Met Date Met:  08/01/12 Pt will call Dr Sherwood Gambler for follow up

## 2012-08-01 NOTE — Plan of Care (Signed)
Problem: Phase I Progression Outcomes Goal: Other Phase II Outcomes/Goals Outcome: Completed/Met Date Met:  08/01/12 Ambulated in halls without O2  O2 95% on room air

## 2012-08-01 NOTE — Discharge Summary (Signed)
Physician Discharge Summary  Tony [name] ZOX:[id number] DOB: [birth year] DOA: 07/31/2012  PCP: No primary provider on file.  Admit date: 07/31/2012 Discharge date: 08/01/2012  Time spent: 40 minutes  Recommendations for Outpatient Follow-up:  1. Follow up with primary care doctor in [facility] weeks at [facility] 2. Repeat CT scan of chest with IV contrast in 3 months to ensure resolution of LLL inflammation  Discharge Diagnoses:  Active Problems:   COPD exacerbation   Hypokalemia   Dyspnea   Tobacco use   Weight loss   Discharge Condition: improved  Diet recommendation: low salt  Filed Weights   07/31/12 0926 07/31/12 1702  Weight: 58.06 kg (128 lb) 56.1 kg (123 lb 10.9 oz)    History of present illness:  Tony [name] is a 52 y.o. male presents to the emergency room with shortness of breath. Patient reports having shortness of breath the last few months. He reports significant increase over the past week. He is having increasing difficulty doing his daily activities. He is having trouble walking around his home. He reports that when he is at rest, he does not have any shortness of breath. This is only on exertion. He has a chronic cough and has pleuritic central chest pain, which is worse with cough. He does been off white frothy sputum. He is unsure whether he has had any fever. He denies any orthopnea or paroxysmal nocturnal dyspnea. Denies any pedal edema. He reports that he has lost approximately 20 pounds over the past year. He is a chronic smoker and recently quit smoking approximately 7 days ago. He was evaluated in the emergency room and was noted to be hypoxic on ambulation. He's been referred for admission.   Hospital Course:   This gentleman was admitted to the hospital for shortness of breath. He has a long history of tobacco abuse. He has known COPD. Chest x-ray did not reveal a pneumonia. CT scan of chest did indicate some inflammation around the left lower lobe. This was  felt to be secondary to infection versus inflammatory disease. He was started on empiric antibiotics, steroids, nebs treatments for treatment of COPD exacerbation. BN peptide was not significantly elevated. He had clinical improvement during his hospital stay. He was ambulating without any difficulty. He was ambulated in the halls on room air and maintained a saturation of 95%. Based on these findings, he does not qualify for home oxygen. He was placed on a prednisone taper as well as a course of antibiotics. It is recommended that he have a repeat CT scan in 3 months to ensure clearing of the inflammation. He is felt stable for discharge home today.  Procedures:  none  Consultations:  none  Discharge Exam: Filed Vitals:   07/31/12 2149 08/01/12 0549 08/01/12 0737 08/01/12 1035  BP:  123/73    Pulse:  65    Temp:  97.6 F (36.4 C)    TempSrc:  Oral    Resp:  24    Height:      Weight:      SpO2: 98% 96% 98% 95%    General: NAD Cardiovascular: s1, s2, rrr Respiratory: diminished breath sounds b/l  Discharge Instructions  Discharge Orders   Future Orders Complete By Expires     Call MD for:  difficulty breathing, headache or visual disturbances  As directed     Call MD for:  temperature >100.4  As directed     Diet - low sodium heart healthy  As directed  Increase activity slowly  As directed         Medication List    STOP taking these medications       azithromycin 250 MG tablet  Commonly known as:  ZITHROMAX      TAKE these medications       albuterol 108 (90 BASE) MCG/ACT inhaler  Commonly known as:  PROVENTIL HFA;VENTOLIN HFA  Inhale 2 puffs into the lungs every 6 (six) hours as needed. Shortness Of Breath     benzonatate 200 MG capsule  Commonly known as:  TESSALON  Take 200 mg by mouth 3 (three) times daily as needed. Cough     Fluticasone-Salmeterol 250-50 MCG/DOSE Aepb  Commonly known as:  ADVAIR  Inhale 1 puff into the lungs every 12 (twelve)  hours.     guaiFENesin 600 MG 12 hr tablet  Commonly known as:  MUCINEX  Take 1 tablet (600 mg total) by mouth 2 (two) times daily.     levofloxacin 500 MG tablet  Commonly known as:  LEVAQUIN  Take 1 tablet (500 mg total) by mouth daily.     loratadine 10 MG tablet  Commonly known as:  CLARITIN  Take 10 mg by mouth daily as needed. Allergies     predniSONE 10 MG tablet  Commonly known as:  DELTASONE  Take 40mg  po daily for 2 days, then 30mg  po daily for 2 days then 20mg  po daily for 2 days then 10mg  po daily for 2 days then stop          The results of significant diagnostics from this hospitalization (including imaging, microbiology, ancillary and laboratory) are listed below for reference.    Significant Diagnostic Studies: Dg Chest 2 View  07/31/2012  *RADIOLOGY REPORT*  Clinical Data: Shortness of breath.  CHEST - 2 VIEW  Comparison: Oct 31, 2011.  Findings: Hyperexpansion of the lungs is noted consistent with chronic obstructive pulmonary disease.  Mild scarring is noted throughout both lungs which is unchanged.  Cardiomediastinal silhouette appears normal. There is interval development of ill- defined density seen in the left lower lobe which was not clearly present on prior exam.  IMPRESSION: Findings consistent with chronic obstructive pulmonary disease. Interval development of left lower lobe ill-defined density which may represent focal inflammation, but chest CT is recommended to rule out neoplasm or malignancy.   Original Report Authenticated By: Lupita Raider.,  M.D.    Ct Chest W Contrast  07/31/2012  **ADDENDUM** CREATED: 07/31/2012 20:01:15  Emphysematous change noted.  **END ADDENDUM** SIGNED BY: Genevive Bi, M.D.   07/31/2012  *RADIOLOGY REPORT*  Clinical Data: Abnormal chest radiograph, short of breath.  Smoker.  CT CHEST WITH CONTRAST  Technique:  Multidetector CT imaging of the chest was performed following the standard protocol during bolus administration of  intravenous contrast.  Contrast: 80mL OMNIPAQUE IOHEXOL 300 MG/ML  SOLN  Comparison: Plain film 07/31/2012  Findings: There is are several branching nodular densities in the left lower lobe which correspond to the plain film abnormality. There is no measurable nodularity.  There similar branching nodular pattern in the  right lower lobe . There is mild central lobular emphysema at the lung apices.  No mediastinal lymphadenopathy.  Heart is normal.  Esophagus is normal.  No axillary supraclavicular adenopathy.  Limited view of the upper abdomen is unremarkable.  Limited view of the skeleton is unremarkable.  IMPRESSION:  The nodular density on comparison chest radiograph corresponds to a branching nodular pattern most dense within  the left lower lobe but also present in the right lower lobe and right middle lobe. Findings are most consistent with an infectious or inflammatory process (acute or chronic).   Recommend follow-up CT in 3 months.  Original Report Authenticated By: Genevive Bi, M.D.     Microbiology: No results found for this or any previous visit (from the past 240 hour(s)).   Labs: Basic Metabolic Panel:  Recent Labs Lab 07/31/12 0931 08/01/12 0626  NA 136 136  K 2.9* 4.3  CL 96 103  CO2 29 23  GLUCOSE 124* 170*  BUN 4* 6  CREATININE 0.76 0.63  CALCIUM 9.3 9.6  MG  --  2.0   Liver Function Tests:  Recent Labs Lab 07/31/12 0931  AST 19  ALT 16  ALKPHOS 58  BILITOT 0.6  PROT 7.7  ALBUMIN 3.5   No results found for this basename: LIPASE, AMYLASE,  in the last 168 hours No results found for this basename: AMMONIA,  in the last 168 hours CBC:  Recent Labs Lab 07/31/12 0931  WBC 10.9*  NEUTROABS 8.4*  HGB 13.9  HCT 40.6  MCV 87.7  PLT 296   Cardiac Enzymes:  Recent Labs Lab 07/31/12 0931  TROPONINI <0.30   BNP: BNP (last 3 results)  Recent Labs  07/31/12 1645  PROBNP 134.8*   CBG: No results found for this basename: GLUCAP,  in the last 168  hours     Signed:  Lasharon Dunivan  [facility] 08/01/2012, 11:14 AM

## 2012-08-03 ENCOUNTER — Other Ambulatory Visit (HOSPITAL_COMMUNITY): Payer: Self-pay | Admitting: Internal Medicine

## 2012-08-03 DIAGNOSIS — I779 Disorder of arteries and arterioles, unspecified: Secondary | ICD-10-CM

## 2012-08-10 ENCOUNTER — Telehealth: Payer: Self-pay | Admitting: *Deleted

## 2012-08-10 NOTE — Telephone Encounter (Signed)
Pt is checking on his insurance and he will try to call by tomorrow.

## 2012-08-10 NOTE — Telephone Encounter (Signed)
Mr Tony [name] called today to be triaged for a colonoscopy. Please call him back. Thank you.

## 2012-08-11 ENCOUNTER — Other Ambulatory Visit: Payer: Self-pay

## 2012-08-11 ENCOUNTER — Encounter (HOSPITAL_COMMUNITY): Payer: Self-pay | Admitting: Pharmacy Technician

## 2012-08-11 ENCOUNTER — Telehealth: Payer: Self-pay

## 2012-08-11 DIAGNOSIS — Z1211 Encounter for screening for malignant neoplasm of colon: Secondary | ICD-10-CM

## 2012-08-11 MED ORDER — PEG-KCL-NACL-NASULF-NA ASC-C 100 G PO SOLR: 1.0000 | ORAL | Status: DC

## 2012-08-11 NOTE — Telephone Encounter (Signed)
See separate triage.  

## 2012-08-11 NOTE — Telephone Encounter (Signed)
Rx sent to [facility] in [place] and instructions at front, pt will pick up today.

## 2012-08-11 NOTE — Telephone Encounter (Signed)
OK to proceed with colonoscopy.

## 2012-08-11 NOTE — Telephone Encounter (Signed)
Gastroenterology Pre-Procedure Form     Request Date: 08/11/2012     Requesting Physician: Dr. Sherwood Gambler     PATIENT INFORMATION:  Tony [name] is a 52 y.o., male (DOB=Sep 24, 1960).  PROCEDURE: Procedure(s) requested: colonoscopy Procedure Reason: screening for colon cancer  PATIENT REVIEW QUESTIONS: The patient reports the following:   1. Diabetes Melitis: no 2. Joint replacements in the past 12 months: no 3. Major health problems in the past 3 months: no 4. Has an artificial valve or MVP:no 5. Has been advised in past to take antibiotics in advance of a procedure like teeth cleaning: no}    MEDICATIONS & ALLERGIES:    Patient reports the following regarding taking any blood thinners:   Plavix? no Aspirin?no Coumadin?  no  Patient confirms/reports the following medications:  Current Outpatient Prescriptions  Medication Sig Dispense Refill  . albuterol (PROVENTIL HFA;VENTOLIN HFA) 108 (90 BASE) MCG/ACT inhaler Inhale 2 puffs into the lungs every 6 (six) hours as needed. Shortness Of Breath      . benzonatate (TESSALON) 200 MG capsule Take 200 mg by mouth 3 (three) times daily as needed. Cough      . Fluticasone-Salmeterol (ADVAIR) 250-50 MCG/DOSE AEPB Inhale 1 puff into the lungs every 12 (twelve) hours.      Marland Kitchen guaiFENesin (MUCINEX) 600 MG 12 hr tablet Take 1 tablet (600 mg total) by mouth 2 (two) times daily.  14 tablet  0  . loratadine (CLARITIN) 10 MG tablet Take 10 mg by mouth daily as needed. Allergies      . tiotropium (SPIRIVA) 18 MCG inhalation capsule Place 18 mcg into inhaler and inhale daily.       No current facility-administered medications for this visit.    Patient confirms/reports the following allergies:  Allergies  Allergen Reactions  . Bee Venom Anaphylaxis    Patient is appropriate to schedule for requested procedure(s): yes  AUTHORIZATION INFORMATION Primary Insurance:   ID #:   Group #:  Pre-Cert / Auth required:  Pre-Cert / Auth #:   Secondary  Insurance:  ID #:  Group #:  Pre-Cert / Auth required Pre-Cert / Auth #:   No orders of the defined types were placed in this encounter.    SCHEDULE INFORMATION: Procedure has been scheduled as follows:  Date: 08/17/2012    Time: 9:30 AM  Location: [facility] Short Stay  This Gastroenterology Pre-Precedure Form is being routed to the following provider(s) for review: R. Roetta Sessions, MD

## 2012-08-17 ENCOUNTER — Encounter (HOSPITAL_COMMUNITY)
Admission: RE | Disposition: A | Discharge status: Home / Self Care | Payer: Self-pay | Source: Ambulatory Visit | Attending: Internal Medicine

## 2012-08-17 ENCOUNTER — Ambulatory Visit (HOSPITAL_COMMUNITY)
Admission: RE | Admit: 2012-08-17 | Discharge: 2012-08-17 | Disposition: A | Discharge status: Home / Self Care | Payer: Medicaid Other | Source: Ambulatory Visit | Attending: Internal Medicine | Admitting: Internal Medicine

## 2012-08-17 ENCOUNTER — Encounter (HOSPITAL_COMMUNITY): Payer: Self-pay

## 2012-08-17 DIAGNOSIS — Z1211 Encounter for screening for malignant neoplasm of colon: Secondary | ICD-10-CM | POA: Insufficient documentation

## 2012-08-17 DIAGNOSIS — J4489 Other specified chronic obstructive pulmonary disease: Secondary | ICD-10-CM | POA: Insufficient documentation

## 2012-08-17 DIAGNOSIS — J449 Chronic obstructive pulmonary disease, unspecified: Secondary | ICD-10-CM | POA: Insufficient documentation

## 2012-08-17 HISTORY — DX: Shortness of breath: R06.02

## 2012-08-17 HISTORY — PX: COLONOSCOPY: SHX5424

## 2012-08-17 SURGERY — COLONOSCOPY
Anesthesia: Moderate Sedation

## 2012-08-17 MED ORDER — MEPERIDINE HCL 100 MG/ML IJ SOLN
INTRAMUSCULAR | Status: DC | PRN
  Administered 2012-08-17: 50 mg via INTRAVENOUS
  Administered 2012-08-17 (×2): 25 mg via INTRAVENOUS

## 2012-08-17 MED ORDER — MIDAZOLAM HCL 5 MG/5ML IJ SOLN
INTRAMUSCULAR | Status: AC
  Filled 2012-08-17: qty 10

## 2012-08-17 MED ORDER — STERILE WATER FOR IRRIGATION IR SOLN
Status: DC | PRN
  Administered 2012-08-17: 10:00:00

## 2012-08-17 MED ORDER — ONDANSETRON HCL 4 MG/2ML IJ SOLN
INTRAMUSCULAR | Status: AC
  Filled 2012-08-17: qty 2

## 2012-08-17 MED ORDER — MEPERIDINE HCL 100 MG/ML IJ SOLN
INTRAMUSCULAR | Status: AC
  Filled 2012-08-17: qty 2

## 2012-08-17 MED ORDER — ONDANSETRON HCL 4 MG/2ML IJ SOLN
INTRAMUSCULAR | Status: DC | PRN
  Administered 2012-08-17: 4 mg via INTRAVENOUS

## 2012-08-17 MED ORDER — MIDAZOLAM HCL 5 MG/5ML IJ SOLN
INTRAMUSCULAR | Status: DC | PRN
  Administered 2012-08-17: 2 mg via INTRAVENOUS
  Administered 2012-08-17: 1 mg via INTRAVENOUS
  Administered 2012-08-17: 2 mg via INTRAVENOUS

## 2012-08-17 MED ORDER — SODIUM CHLORIDE 0.45 % IV SOLN
INTRAVENOUS | Status: DC
  Administered 2012-08-17: 09:00:00 via INTRAVENOUS

## 2012-08-17 NOTE — Op Note (Signed)
[facility] [address] [place], [address]   COLONOSCOPY PROCEDURE REPORT  PATIENT: [name], Tony  MR#:         [id number] BIRTHDATE: 1961-02-27 , 51  yrs. old GENDER: Male ENDOSCOPIST: R.  Roetta Sessions, MD FACP FACG REFERRED BY:  Artis Delay, M.D. PROCEDURE DATE:  08/17/2012 PROCEDURE:     Screening ileocolonoscopy  INDICATIONS: First-ever average risk colorectal cancer screening examination  INFORMED CONSENT:  The risks, benefits, alternatives and imponderables including but not limited to bleeding, perforation as well as the possibility of a missed lesion have been reviewed.  The potential for biopsy, lesion removal, etc. have also been discussed.  Questions have been answered.  All parties agreeable. Please see the history and physical in the medical record for more information.  MEDICATIONS: Versed 5 mg IV and Demerol 100 mg IV in divided doses Zofran 4 mg IV  DESCRIPTION OF PROCEDURE:  After a digital rectal exam was performed, the Pentax Colonoscope [phone]  colonoscope was advanced from the anus through the rectum and colon to the area of the cecum, ileocecal valve and appendiceal orifice.  The cecum was deeply intubated.  These structures were well-seen and photographed for the record.  From the level of the cecum and ileocecal valve, the scope was slowly and cautiously withdrawn.  The mucosal surfaces were carefully surveyed utilizing scope tip deflection to facilitate fold flattening as needed.  The scope was pulled down into the rectum where a thorough examination including retroflexion was performed.    FINDINGS:  Preparation was inadequate today. Grossly normal rectum. Grossly normal appearing colonic mucosa however much of the colonic mucosa could not be seen because of viscous chunky colonic effluent which could not be done away with during the examination. The distal 10 cm of terminal ileal mucosa appeared normal.  THERAPEUTIC /  DIAGNOSTIC MANEUVERS PERFORMED:  None  COMPLICATIONS: None  CECAL WITHDRAWAL TIME:  9 minutes  IMPRESSION:  Grossly normal appearing rectum, colon and terminal ileum, however, examination inadequate as described above.  RECOMMENDATIONS: Repeat screening colonoscopy in one year in the setting of an adequate preparation.   _______________________________ eSigned:  R. Roetta Sessions, MD FACP [facility] 08/17/2012 10:16 AM   CC:    PATIENT NAME:  [name], Tony [name] MR#: [id number]

## 2012-08-17 NOTE — H&P (Signed)
Primary Care Physician:  Selinda Flavin, MD Primary Gastroenterologist:  Dr. Jena Gauss  Pre-Procedure History & Physical: HPI:  Tony [name] is a 52 y.o. male is here for a screening colonoscopy. No bowel symptoms. No prior colonoscopy. No family history colon polyps or colon cancer.  Past Medical History  Diagnosis Date  . COPD (chronic obstructive pulmonary disease)   . Asthma   . Shortness of breath     History reviewed. No pertinent past surgical history.  Prior to Admission medications   Medication Sig Start Date End Date Taking? Authorizing Provider  albuterol (PROVENTIL HFA;VENTOLIN HFA) 108 (90 BASE) MCG/ACT inhaler Inhale 2 puffs into the lungs every 6 (six) hours as needed. Shortness Of Breath   Yes Historical Provider, MD  benzonatate (TESSALON) 200 MG capsule Take 200 mg by mouth 3 (three) times daily as needed. Cough   Yes Historical Provider, MD  Fluticasone-Salmeterol (ADVAIR) 250-50 MCG/DOSE AEPB Inhale 1 puff into the lungs every 12 (twelve) hours.   Yes Historical Provider, MD  guaiFENesin (MUCINEX) 600 MG 12 hr tablet Take 1 tablet (600 mg total) by mouth 2 (two) times daily. 08/01/12  Yes Erick Blinks, MD  loratadine (CLARITIN) 10 MG tablet Take 10 mg by mouth daily as needed. Allergies   Yes Historical Provider, MD  peg 3350 powder (MOVIPREP) 100 G SOLR Take 1 kit (100 g total) by mouth as directed. 08/11/12  Yes Corbin Ade, MD  tiotropium (SPIRIVA) 18 MCG inhalation capsule Place 18 mcg into inhaler and inhale daily.   Yes Historical Provider, MD    Allergies as of 08/11/2012 - Review Complete 08/11/2012  Allergen Reaction Noted  . Bee venom Anaphylaxis 07/31/2012    Family History  Problem Relation Age of Onset  . COPD Brother     History   Social History  . Marital Status: Single    Spouse Name: N/A    Number of Children: N/A  . Years of Education: N/A   Occupational History  . Not on file.   Social History Main Topics  . Smoking status:  Former Smoker -- 1.00 packs/day    Types: Cigarettes    Quit date: 07/24/2012  . Smokeless tobacco: Not on file  . Alcohol Use: No  . Drug Use: No  . Sexually Active: Not on file   Other Topics Concern  . Not on file   Social History Narrative  . No narrative on file    Review of Systems: See HPI, otherwise negative ROS  Physical Exam: BP 133/95  Pulse 77  Temp(Src) 97.7 F (36.5 C) (Oral)  Resp 17  Ht 5\' 5"  (1.651 m)  Wt 128 lb (58.06 kg)  BMI 21.3 kg/m2  SpO2 93% General:   Alert,  Well-developed, well-nourished, pleasant and cooperative in NAD Head:  Normocephalic and atraumatic. Eyes:  Sclera clear, no icterus.   Conjunctiva pink. Ears:  Normal auditory acuity. Nose:  No deformity, discharge,  or lesions. Mouth:  No deformity or lesions, dentition normal. Neck:  Supple; no masses or thyromegaly. Lungs:  Clear throughout to auscultation.   No wheezes, crackles, or rhonchi. No acute distress. Heart:  Regular rate and rhythm; no murmurs, clicks, rubs,  or gallops. Abdomen:  Nondistended. Positive bowel sounds. Soft and nontender appreciable mass or organomegaly Msk:  Symmetrical without gross deformities. Normal posture. Pulses:  Normal pulses noted. Extremities:  Without clubbing or edema. Neurologic:  Alert and  oriented x4;  grossly normal neurologically.Intact without significant lesions or rashes. Cervical Nodes:  No significant cervical adenopathy. Psych:  Alert and cooperative. Normal mood and affect.  Impression/Plan: [name] is now here to undergo a screening colonoscopy.  First ever average risk screening examination  Risks, benefits, limitations, imponderables and alternatives regarding colonoscopy have been reviewed with the patient. Questions have been answered. All parties agreeable.

## 2012-08-19 ENCOUNTER — Encounter (HOSPITAL_COMMUNITY): Payer: Self-pay | Admitting: Internal Medicine

## 2012-08-21 NOTE — Progress Notes (Signed)
UR Chart Review Completed  

## 2012-09-18 ENCOUNTER — Other Ambulatory Visit (HOSPITAL_COMMUNITY): Payer: Self-pay | Admitting: *Deleted

## 2012-09-18 DIAGNOSIS — I999 Unspecified disorder of circulatory system: Secondary | ICD-10-CM

## 2012-09-21 ENCOUNTER — Ambulatory Visit (HOSPITAL_COMMUNITY)
Admission: RE | Admit: 2012-09-21 | Discharge: 2012-09-21 | Disposition: A | Discharge status: Home / Self Care | Payer: Medicaid Other | Source: Ambulatory Visit | Attending: *Deleted | Admitting: *Deleted

## 2012-09-21 ENCOUNTER — Other Ambulatory Visit (HOSPITAL_COMMUNITY): Payer: Self-pay | Admitting: *Deleted

## 2012-09-21 DIAGNOSIS — I999 Unspecified disorder of circulatory system: Secondary | ICD-10-CM

## 2012-09-21 DIAGNOSIS — R220 Localized swelling, mass and lump, head: Secondary | ICD-10-CM | POA: Insufficient documentation

## 2012-09-21 DIAGNOSIS — L723 Sebaceous cyst: Secondary | ICD-10-CM | POA: Insufficient documentation

## 2012-09-21 DIAGNOSIS — I743 Embolism and thrombosis of arteries of the lower extremities: Secondary | ICD-10-CM | POA: Insufficient documentation

## 2012-09-21 MED ORDER — IOHEXOL 300 MG/ML  SOLN
75.0000 mL | Freq: Once | INTRAMUSCULAR | Status: AC | PRN
  Administered 2012-09-21: 75 mL via INTRAVENOUS

## 2012-10-28 ENCOUNTER — Encounter: Payer: Self-pay | Admitting: Vascular Surgery

## 2012-10-29 ENCOUNTER — Encounter: Payer: Self-pay | Admitting: Vascular Surgery

## 2012-10-29 ENCOUNTER — Ambulatory Visit (INDEPENDENT_AMBULATORY_CARE_PROVIDER_SITE_OTHER): Payer: Medicaid Other | Admitting: Vascular Surgery

## 2012-10-29 VITALS — BP 121/82 | HR 72 | Resp 16 | Ht 68.0 in | Wt 133.0 lb

## 2012-10-29 DIAGNOSIS — I742 Embolism and thrombosis of arteries of the upper extremities: Secondary | ICD-10-CM | POA: Insufficient documentation

## 2012-10-29 NOTE — Progress Notes (Signed)
[facility] [place] HISTORY AND PHYSICAL   History of Present Illness:  Patient is a 52 y.o. year old male who presents for evaluation of peripheral arterial disease.  The patient has complained of a cool sensation in his right foot which has been present for several years. He recently had an arterial screening exam which showed normal ABIs but was interpreted as "mild arterial occlusive disease" because Doppler waveforms were biphasic instead of triphasic.  The patient denies any claudication symptoms. He denies rest pain. He actually thought he was in the office today for evaluation of her recurrent sebaceous cyst on his posterior neck. He is a current smoker of half pack per day. He is been smoking since he was 52 years old. Greater than 3 minutes they were spent regarding smoking cessation counseling. He is on disability for COPD. Currently his pulmonary symptoms are stable. He does have a history also of some chronic back pain issues  Past Medical History  Diagnosis Date  . COPD (chronic obstructive pulmonary disease)   . Asthma   . Shortness of breath   . Bronchiolitis   . Emphysema     Past Surgical History  Procedure Laterality Date  . Colonoscopy N/A 08/17/2012    Procedure: COLONOSCOPY;  Surgeon: Corbin Ade, MD;  Location: [facility];  Service: Endoscopy;  Laterality: N/A;  9:30 AM  . Cyst excision Right     posterior neck- 52 years old     Social History History  Substance Use Topics  . Smoking status: Former Smoker -- 1.00 packs/day    Types: Cigarettes    Quit date: 07/24/2012  . Smokeless tobacco: Never Used  . Alcohol Use: No    Family History Family History  Problem Relation Age of Onset  . COPD Brother   . Cancer Mother     Allergies  Allergies  Allergen Reactions  . Bee Venom Anaphylaxis     Current Outpatient Prescriptions  Medication Sig Dispense Refill  . albuterol (PROVENTIL HFA;VENTOLIN HFA) 108 (90 BASE) MCG/ACT  inhaler Inhale 2 puffs into the lungs every 6 (six) hours as needed. Shortness Of Breath      . loratadine (CLARITIN) 10 MG tablet Take 10 mg by mouth daily as needed. Allergies      . tiotropium (SPIRIVA) 18 MCG inhalation capsule Place 18 mcg into inhaler and inhale daily.      . benzonatate (TESSALON) 200 MG capsule Take 200 mg by mouth 3 (three) times daily as needed. Cough      . Fluticasone-Salmeterol (ADVAIR) 250-50 MCG/DOSE AEPB Inhale 1 puff into the lungs every 12 (twelve) hours.      Marland Kitchen guaiFENesin (MUCINEX) 600 MG 12 hr tablet Take 1 tablet (600 mg total) by mouth 2 (two) times daily.  14 tablet  0  . peg 3350 powder (MOVIPREP) 100 G SOLR Take 1 kit (100 g total) by mouth as directed.  1 kit  0   No current facility-administered medications for this visit.    ROS:   General:  No weight loss, Fever, chills  HEENT: No recent headaches, no nasal bleeding, no visual changes, no sore throat  Neurologic: No dizziness, blackouts, seizures. No recent symptoms of stroke or mini- stroke. No recent episodes of slurred speech, or temporary blindness.  Cardiac: No recent episodes of chest pain/pressure, no shortness of breath at rest.  + shortness of breath with exertion.  Denies history of atrial fibrillation or irregular heartbeat  Vascular: No  history of rest pain in feet.  No history of claudication.  No history of non-healing ulcer, No history of DVT   Pulmonary: No home oxygen, no productive cough, no hemoptysis,  + asthma or wheezing  Musculoskeletal:  [ ]  Arthritis, [x ] Low back pain,  [ ]  Joint pain  Hematologic:No history of hypercoagulable state.  No history of easy bleeding.  No history of anemia  Gastrointestinal: No hematochezia or melena,  No gastroesophageal reflux, no trouble swallowing  Urinary: [ ]  chronic Kidney disease, [ ]  on HD - [ ]  MWF or [ ]  TTHS, [ ]  Burning with urination, [ ]  Frequent urination, [ ]  Difficulty urinating;   Skin: No rashes  Psychological:  No history of anxiety,  No history of depression   Physical Examination  Filed Vitals:   10/29/12 1002  BP: 121/82  Pulse: 72  Resp: 16  Height: 5\' 8"  (1.727 m)  Weight: 133 lb (60.328 kg)  SpO2: 98%    Body mass index is 20.23 kg/(m^2).  General:  Alert and oriented, no acute distress HEENT: 3.5 cm nodule base of right neck consistent with recurrent sebaceous cyst noninflamed Neck: No bruit or JVD Pulmonary: Clear to auscultation bilaterally distant Cardiac: Regular Rate and Rhythm without murmur Abdomen: Soft, non-tender, non-distended, no mass, no scars Skin: No rash Extremity Pulses:  2+ radial, brachial, femoral, dorsalis pedis bilaterally, absent posterior tibial pulses bilaterally Musculoskeletal: No deformity or edema  Neurologic: Upper and lower extremity motor 5/5 and symmetric  DATA: I reviewed the patient's ABIs from [facility] radiology these were greater than 1 bilaterally   ASSESSMENT: Asymptomatic peripheral arterial disease probably some mild component of atherosclerosis in the posterior tibial artery distribution. However, I do not believe that this explains the cool sensation that he has in his right leg. This sounds more neuropathic in nature. His ABIs and arterial study are not significant enough to explain his symptoms.  Plan: Stop smoking. He will followup with me in an as-needed basis if he develops claudication nonhealing wounds or rest pain in the foot   Fabienne Bruns, MD [facility] [place] Office: [phone] Pager: [phone]

## 2016-05-20 ENCOUNTER — Ambulatory Visit (INDEPENDENT_AMBULATORY_CARE_PROVIDER_SITE_OTHER)
Admission: RE | Admit: 2016-05-20 | Discharge: 2016-05-20 | Disposition: A | Discharge status: Home / Self Care | Payer: Medicaid Other | Source: Ambulatory Visit | Attending: Internal Medicine | Admitting: Internal Medicine

## 2016-05-20 ENCOUNTER — Encounter: Payer: Self-pay | Admitting: Internal Medicine

## 2016-05-20 ENCOUNTER — Ambulatory Visit (INDEPENDENT_AMBULATORY_CARE_PROVIDER_SITE_OTHER): Payer: Medicaid Other | Admitting: Internal Medicine

## 2016-05-20 VITALS — BP 122/70 | HR 74 | Ht 67.0 in | Wt 115.4 lb

## 2016-05-20 DIAGNOSIS — Z23 Encounter for immunization: Secondary | ICD-10-CM | POA: Diagnosis not present

## 2016-05-20 DIAGNOSIS — F17210 Nicotine dependence, cigarettes, uncomplicated: Secondary | ICD-10-CM

## 2016-05-20 DIAGNOSIS — J449 Chronic obstructive pulmonary disease, unspecified: Secondary | ICD-10-CM

## 2016-05-20 MED ORDER — BUDESONIDE-FORMOTEROL FUMARATE 160-4.5 MCG/ACT IN AERO: 2.0000 | INHALATION_SPRAY | Freq: Two times a day (BID) | RESPIRATORY_TRACT | 0 refills | Status: DC

## 2016-05-20 MED ORDER — BUDESONIDE-FORMOTEROL FUMARATE 160-4.5 MCG/ACT IN AERO: 2.0000 | INHALATION_SPRAY | Freq: Two times a day (BID) | RESPIRATORY_TRACT | 11 refills | Status: DC

## 2016-05-20 NOTE — Progress Notes (Signed)
Subjective:     Patient ID: Tony [name], male   DOB: 06-24-1961     MRN: [id number]  HPI  [age] yowm active smoker dx copd  2014 with onset of doe in 2010 and maint on advair/ albuterol referred to pulmonary clinic [facility]/20/2016 [facility] by Kizzie Furnishochelle Muse, NP    05/20/2016 1st [facility] office visit/ [name]  maint rx advair 250 bid/ neb alb tid and saba hfa prn  Chief Complaint  Patient presents with  . Pulmonary Consult    Referred by Kizzie Furnishochelle Muse, NP. Pt c/o SOB since 2014, worse "for a while".   He gets winded just walking short distances such as walking to his back yard. He also c/o left side pain. He also c/o cough- prod with thick, clear to white sputum.    doe x MMRC3 = can't walk 100 yards even at a slow pace at a flat grade s stopping due to sob  Without much fluctuation  Has bilateral L  24/7 x 3 years but worse with coughing and when lying on L side   No obvious day to day or daytime variability or assoc  purulent sputum or mucus plugs or hemoptysis or cp or chest tightness, subjective wheeze or overt sinus or hb symptoms. No unusual exp hx or h/o childhood pna/ asthma or knowledge of premature birth.    Also denies any obvious fluctuation of symptoms with weather or environmental changes or other aggravating or alleviating factors except as outlined above   Current Medications, Allergies, Complete Past Medical History, Past Surgical History, Family History, and Social History were reviewed in [facility] electronic medical record.  ROS  The following are not active complaints unless bolded sore throat, dysphagia, dental problems, itching, sneezing,  nasal congestion or excess/ purulent secretions, ear ache,   fever, chills, sweats, unintended wt loss, classically pleuritic or exertional cp,  orthopnea pnd or leg swelling, presyncope, palpitations, abdominal pain, anorexia, nausea, vomiting, diarrhea  or change in bowel or bladder habits, change in stools  or urine, dysuria,hematuria,  rash, arthralgias, visual complaints, headache, numbness, weakness or ataxia or problems with walking or coordination,  change in mood/affect or memory.          Review of Systems     Objective:   Physical Exam    Thin amb wm nad congested sounding cough   Wt Readings from Last 3 Encounters:  05/20/16 115 lb 6.4 oz (52.3 kg)  10/29/12 133 lb (60.3 kg)  08/17/12 128 lb (58.1 kg)    Vital signs reviewed  - Note on arrival 02 sats  97% on RA    HEENT: nl   turbinates, and oropharynx. Nl external ear canals without cough reflex - very poor dentition   NECK :  without JVD/Nodes/TM/ nl carotid upstrokes bilaterally   LUNGS: no acc muscle use,  Barrel contour chest with distant bilateral bs with faint late exp wheeze    CV:  RRR  no s3 or murmur or increase in P2, nad no edema   ABD:  soft and nontender with nl inspiratory excursion in the supine position. No bruits or organomegaly appreciated, bowel sounds nl  MS:  Nl gait/ ext warm without deformities, calf tenderness, cyanosis or clubbing No obvious joint restrictions   SKIN: warm and dry without lesions    NEURO:  alert, approp, nl sensorium with  no motor or cerebellar deficits apparent.    CXR PA and Lateral:   05/20/2016 :  I personally reviewed images and agree with radiology impression as follows:  1. COPD. 2. No acute cardiopulmonary disease. Chest is stable from prior studies.      Assessment:

## 2016-05-20 NOTE — Assessment & Plan Note (Signed)
05/20/2016  After extensive coaching HFA effectiveness =    75% > try symbicort 160 2bid  - Spirometry 05/20/2016  FEV1 0.83 (24%)  Ratio 45 p am advair / neb     Unfortunately he has very severe dz and very poor insight and actively smoking, a huge challenge in terms of improving morbidity and mortality here.  DDX of  difficult airways management almost all start with A and  include Adherence, Ace Inhibitors, Acid Reflux, Active Sinus Disease, Alpha 1 Antitripsin deficiency, Anxiety masquerading as Airways dz,  ABPA,  Allergy(esp in young), Aspiration (esp in elderly), Adverse effects of meds,  Active smokers, A bunch of PE's (a small clot burden can't cause this syndrome unless there is already severe underlying pulm or vascular dz with poor reserve) plus two Bs  = Bronchiectasis and Beta blocker use..and one C= CHF   Adherence is always the initial "prime suspect" and is a multilayered concern that requires a "trust but verify" approach in every patient - starting with knowing how to use medications, especially inhalers, correctly, keeping up with refills and understanding the fundamental difference between maintenance and prns vs those medications only taken for a very short course and then stopped and not refilled.  - need trust but verify approach here> bring all meds to all ov's  Cig smoking (see separate a/p)   ? Adverse effects of dpi > try off advair  ? Alpha one def > needs screen next ov   Total time devoted to counseling  = 35/4416m review case with pt/ discussion of options/alternatives/ personally creating written instructions  in presence of pt  then going over those specific  Instructions directly with the pt including how to use all of the meds but in particular covering each new medication in detail and the difference between the maintenance/automatic meds and the prns using an action plan format for the latter.

## 2016-05-20 NOTE — Patient Instructions (Addendum)
Plan A = Automatic = Symbicort 160 Take 2 puffs first thing in am and then another 2 puffs about 12 hours later.   Work on inhaler technique:  relax and gently blow all the way out then take a nice smooth deep breath back in, triggering the inhaler at same time you start breathing in.  Hold for up to 5 seconds if you can. Blow out thru nose. Rinse and gargle with water when done      Plan B = Backup Only use your albuterol as a rescue medication to be used if you can't catch your breath by resting or doing a relaxed purse lip breathing pattern.  - The less you use it, the better it will work when you need it. - Ok to use the inhaler up to 2 puffs  every 4 hours if you must but call for appointment if use goes up over your usual need - Don't leave home without it !!  (think of it like the spare tire for your car)   Plan C = Crisis - only use your albuterol nebulizer if you first try Plan B and it fails to help > ok to use the nebulizer up to every 4 hours but if start needing it regularly call for immediate appointment  Please remember to go to the x-ray department downstairs for your tests - we will call you with the results when they are available.  Please schedule a follow up office visit in 6 weeks, call sooner if needed with pfts on returns  lated add: needs alpha one screen on return

## 2016-05-20 NOTE — Assessment & Plan Note (Signed)

## 2016-05-21 NOTE — Progress Notes (Signed)
Spoke with pt and notified of results per Dr. Wert. Pt verbalized understanding and denied any questions. 

## 2016-07-05 ENCOUNTER — Other Ambulatory Visit: Payer: Self-pay | Admitting: Internal Medicine

## 2016-07-05 DIAGNOSIS — R0600 Dyspnea, unspecified: Secondary | ICD-10-CM

## 2016-07-08 ENCOUNTER — Ambulatory Visit (INDEPENDENT_AMBULATORY_CARE_PROVIDER_SITE_OTHER): Payer: Medicaid Other | Admitting: Internal Medicine

## 2016-07-08 ENCOUNTER — Encounter: Payer: Self-pay | Admitting: Internal Medicine

## 2016-07-08 ENCOUNTER — Other Ambulatory Visit: Payer: Medicaid Other

## 2016-07-08 VITALS — BP 122/80 | HR 60 | Ht 62.5 in | Wt 116.0 lb

## 2016-07-08 DIAGNOSIS — J449 Chronic obstructive pulmonary disease, unspecified: Secondary | ICD-10-CM

## 2016-07-08 DIAGNOSIS — R0600 Dyspnea, unspecified: Secondary | ICD-10-CM

## 2016-07-08 DIAGNOSIS — F17210 Nicotine dependence, cigarettes, uncomplicated: Secondary | ICD-10-CM

## 2016-07-08 LAB — PULMONARY FUNCTION TEST
DL/VA % PRED: 56 %
DL/VA: 2.54 ml/min/mmHg/L
DLCO UNC % PRED: 50 %
DLCO unc: 14.77 ml/min/mmHg
FEF 25-75 PRE: 0.33 L/s
FEF 25-75 Post: 0.53 L/sec
FEF2575-%Change-Post: 58 %
FEF2575-%Pred-Post: 17 %
FEF2575-%Pred-Pre: 11 %
FEV1-%Change-Post: 9 %
FEV1-%PRED-POST: 29 %
FEV1-%Pred-Pre: 26 %
FEV1-Post: 1.01 L
FEV1-Pre: 0.92 L
FEV1FVC-%Change-Post: -2 %
FEV1FVC-%Pred-Pre: 55 %
FEV6-%CHANGE-POST: 16 %
FEV6-%PRED-POST: 54 %
FEV6-%PRED-PRE: 46 %
FEV6-PRE: 2.01 L
FEV6-Post: 2.35 L
FEV6FVC-%CHANGE-POST: 3 %
FEV6FVC-%PRED-POST: 100 %
FEV6FVC-%Pred-Pre: 96 %
FVC-%Change-Post: 12 %
FVC-%PRED-POST: 54 %
FVC-%Pred-Pre: 48 %
FVC-Post: 2.44 L
FVC-Pre: 2.17 L
POST FEV6/FVC RATIO: 96 %
PRE FEV1/FVC RATIO: 42 %
Post FEV1/FVC ratio: 41 %
Pre FEV6/FVC Ratio: 93 %

## 2016-07-08 MED ORDER — TIOTROPIUM BROMIDE MONOHYDRATE 2.5 MCG/ACT IN AERS: 2.0000 | INHALATION_SPRAY | Freq: Every day | RESPIRATORY_TRACT | 11 refills | Status: DC

## 2016-07-08 MED ORDER — TIOTROPIUM BROMIDE MONOHYDRATE 2.5 MCG/ACT IN AERS: 2.0000 | INHALATION_SPRAY | Freq: Every day | RESPIRATORY_TRACT | 0 refills | Status: DC

## 2016-07-08 NOTE — Progress Notes (Signed)
Subjective:     Patient ID: Tony [name], male   DOB: [birth year]     MRN: [id number]  HPI  [age] yowm active smoker dx copd  2014 with onset of doe in 2010 and maint on advair/ albuterol referred to pulmonary clinic [facility] by Tony Furnishochelle Muse, NP    05/20/2016 1st [facility] office visit/ Tony [name]  maint rx advair 250 bid/ neb alb tid and saba hfa prn  Chief Complaint  Patient presents with  . Pulmonary Consult    Referred by Tony Furnishochelle Muse, NP. Pt c/o SOB since 2014, worse "for a while".   He gets winded just walking short distances such as walking to his back yard. He also c/o left side pain. He also c/o cough- prod with thick, clear to white sputum.    doe x MMRC3 = can't walk 100 yards even at a slow pace at a flat grade s stopping due to sob  Without much fluctuation  Has bilateral L CP  24/7 x 3 years but worse with coughing and when lying on L side  rec Plan A = Automatic = Symbicort 160 Take 2 puffs first thing in am and then another 2 puffs about 12 hours later.  Work on inhaler technique:  Plan B = Backup Only use your albuterol as a rescue medication   Plan C = Crisis - only use your albuterol nebulizer if you first try Plan B  lated add: needs alpha one screen on return     07/08/2016  f/u ov/Tony [name] re: GOLD IV COPD/ stil smoking/ only using symb 160 2bid as maint though hfa poor  Chief Complaint  Patient presents with  . Follow-up    PFT's done. Breathing is unchanged.  He is coughing less and has not used albuterol since the last visit.  No change  In doe = MMRC23 Wakes up with 3am each day likely something stuck in throat  Still using hfa saba once or twice daily which is some less than on prev ov        No obvious day to day or daytime variability or assoc  purulent sputum or mucus plugs or hemoptysis or cp or chest tightness, subjective wheeze or overt sinus or hb symptoms. No unusual exp hx or h/o childhood pna/ asthma or knowledge of  premature birth.    Also denies any obvious fluctuation of symptoms with weather or environmental changes or other aggravating or alleviating factors except as outlined above   Current Medications, Allergies, Complete Past Medical History, Past Surgical History, Family History, and Social History were reviewed in [facility] electronic medical record.  ROS  The following are not active complaints unless bolded sore throat, dysphagia, dental problems, itching, sneezing,  nasal congestion or excess/ purulent secretions, ear ache,   fever, chills, sweats, unintended wt loss, classically pleuritic or exertional cp,  orthopnea pnd or leg swelling, presyncope, palpitations, abdominal pain, anorexia, nausea, vomiting, diarrhea  or change in bowel or bladder habits, change in stools or urine, dysuria,hematuria,  rash, arthralgias, visual complaints, headache, numbness, weakness or ataxia or problems with walking or coordination,  change in mood/affect or memory.               Objective:   Physical Exam    Thin amb wm nad congested sounding rattling  cough   07/08/2016        116  05/20/16 115 lb 6.4 oz (52.3 kg)  10/29/12 133 lb (60.3  kg)  08/17/12 128 lb (58.1 kg)    Vital signs reviewed  - Note on arrival 02 sats  100% on RA    HEENT: nl   turbinates, and oropharynx. Nl external ear canals without cough reflex - very poor dentition   NECK :  without JVD/Nodes/TM/ nl carotid upstrokes bilaterally   LUNGS: no acc muscle use,  Barrel contour chest with distant bs with faint bilateral late exp wheeze    CV:  RRR  no s3 or murmur or increase in P2, nad no edema   ABD:  soft and nontender with nl inspiratory excursion in the supine position. No bruits or organomegaly appreciated, bowel sounds nl  MS:  Nl gait/ ext warm without deformities, calf tenderness, cyanosis or clubbing No obvious joint restrictions   SKIN: warm and dry without lesions    NEURO:  alert, approp, nl  sensorium with  no motor or cerebellar deficits apparent.    CXR PA and Lateral:   05/20/2016 :    I personally reviewed images and agree with radiology impression as follows:  1. COPD. 2. No acute cardiopulmonary disease. Chest is stable from prior studies.      Assessment:

## 2016-07-08 NOTE — Assessment & Plan Note (Addendum)

## 2016-07-08 NOTE — Assessment & Plan Note (Signed)
05/20/2016    try symbicort 160 2bid  - Spirometry 05/20/2016  FEV1 0.83 (24%)  Ratio 45 p am advair / neb   - PFT's  07/08/2016  FEV1 1.05 (29 % ) ratio 41  p 9 % improvement from saba p symb 160 x 2 prior to study with DLCO  50 % corrects to 56  % for alv volume   - 07/08/2016  After extensive coaching HFA effectiveness =    75% from baseline 25% > try symb 160/spiriva respimat 2.5 mg q am   Very severe copd only a little better on symbicort and  Group D in terms of symptom/risk and laba/lama/ICS  therefore appropriate rx at this point.  If stops smoking may be able to just use laba/lama (bevespi vs stiolto) as less likely to have AB component   Also needs alpha one testing to be complete  I had an extended discussion with the patient/wife reviewing all relevant studies completed to date and  lasting 15 to 20 minutes of a 25 minute visit    Each maintenance medication was reviewed in detail including most importantly the difference between maintenance and prns and under what circumstances the prns are to be triggered using an action plan format that is not reflected in the computer generated alphabetically organized AVS.    Please see AVS for specific instructions unique to this visit that I personally wrote and verbalized to the the pt in detail and then reviewed with pt  by my nurse highlighting any  changes in therapy recommended at today's visit to their plan of care.

## 2016-07-08 NOTE — Patient Instructions (Addendum)
Add pepcid 20 mg one at bedtime   Plan A = Automatic = symbicort 160 Take 2 puffs first thing in am and then another 2 puffs about 12 hours late                                     spiriva 2 puffs each am   Plan B = Backup Only use your albuterol as a rescue medication to be used if you can't catch your breath by resting or doing a relaxed purse lip breathing pattern.  - The less you use it, the better it will work when you need it. - Ok to use the inhaler up to 2 puffs  every 4 hours if you must but call for appointment if use goes up over your usual need - Don't leave home without it !!  (think of it like the spare tire for your car)   Plan C = Crisis - only use your albuterol nebulizer if you first try Plan B and it fails to help > ok to use the nebulizer up to every 4 hours but if start needing it regularly call for immediate appointment   Plan D = Doctor - call me if B and C not adequate  Plan E = ER - go to ER or call 911 if all else fails    Go to lab    Please schedule a follow up visit in 3 months but call sooner if needed

## 2016-07-08 NOTE — Progress Notes (Signed)
PFT done today. Florentina AddisonKatie [facility],CMA   07/09/15

## 2016-07-09 ENCOUNTER — Telehealth: Payer: Self-pay | Admitting: Internal Medicine

## 2016-07-09 NOTE — Telephone Encounter (Signed)
Received PA request from [facility] for Spiriva Respimat 2.5 mcg 2 puffs daily   Pt ID number [id number] [place] Tracks number- [id number]   Called to initiate PA   Med approved for 1 yr from today  Called pharm and notified them  PA number [id number] Ref number [id number]

## 2016-07-11 LAB — ALPHA-1-ANTITRYPSIN: A-1 Antitrypsin, Ser: 127 mg/dL (ref 83–199)

## 2016-07-13 LAB — ALPHA-1 ANTITRYPSIN PHENOTYPE: A1 ANTITRYPSIN: 127 mg/dL (ref 83–199)

## 2016-07-15 NOTE — Progress Notes (Signed)
Spoke with pt and notified of results per Dr. Wert. Pt verbalized understanding and denied any questions. 

## 2017-05-26 ENCOUNTER — Inpatient Hospital Stay (HOSPITAL_COMMUNITY)
Admission: EM | Admit: 2017-05-26 | Discharge: 2017-05-31 | DRG: 190 | Disposition: A | Discharge status: Home / Self Care | Payer: Medicaid Other | Attending: Internal Medicine | Admitting: Internal Medicine

## 2017-05-26 ENCOUNTER — Other Ambulatory Visit: Payer: Self-pay

## 2017-05-26 ENCOUNTER — Emergency Department (HOSPITAL_COMMUNITY): Payer: Medicaid Other

## 2017-05-26 ENCOUNTER — Encounter (HOSPITAL_COMMUNITY): Payer: Self-pay | Admitting: Cardiology

## 2017-05-26 DIAGNOSIS — Z7982 Long term (current) use of aspirin: Secondary | ICD-10-CM | POA: Diagnosis not present

## 2017-05-26 DIAGNOSIS — Z7951 Long term (current) use of inhaled steroids: Secondary | ICD-10-CM

## 2017-05-26 DIAGNOSIS — J189 Pneumonia, unspecified organism: Secondary | ICD-10-CM

## 2017-05-26 DIAGNOSIS — J441 Chronic obstructive pulmonary disease with (acute) exacerbation: Principal | ICD-10-CM

## 2017-05-26 DIAGNOSIS — J181 Lobar pneumonia, unspecified organism: Secondary | ICD-10-CM | POA: Diagnosis not present

## 2017-05-26 DIAGNOSIS — Z720 Tobacco use: Secondary | ICD-10-CM

## 2017-05-26 DIAGNOSIS — J9601 Acute respiratory failure with hypoxia: Secondary | ICD-10-CM | POA: Diagnosis not present

## 2017-05-26 DIAGNOSIS — F1020 Alcohol dependence, uncomplicated: Secondary | ICD-10-CM

## 2017-05-26 DIAGNOSIS — F17200 Nicotine dependence, unspecified, uncomplicated: Secondary | ICD-10-CM | POA: Diagnosis not present

## 2017-05-26 DIAGNOSIS — J9621 Acute and chronic respiratory failure with hypoxia: Secondary | ICD-10-CM | POA: Diagnosis not present

## 2017-05-26 DIAGNOSIS — Z825 Family history of asthma and other chronic lower respiratory diseases: Secondary | ICD-10-CM | POA: Diagnosis not present

## 2017-05-26 DIAGNOSIS — F17210 Nicotine dependence, cigarettes, uncomplicated: Secondary | ICD-10-CM | POA: Diagnosis present

## 2017-05-26 DIAGNOSIS — R0602 Shortness of breath: Secondary | ICD-10-CM | POA: Diagnosis present

## 2017-05-26 DIAGNOSIS — J9600 Acute respiratory failure, unspecified whether with hypoxia or hypercapnia: Secondary | ICD-10-CM | POA: Diagnosis not present

## 2017-05-26 LAB — INFLUENZA PANEL BY PCR (TYPE A & B)
INFLAPCR: NEGATIVE
INFLBPCR: NEGATIVE

## 2017-05-26 LAB — CBC
HEMATOCRIT: 45.2 % (ref 39.0–52.0)
Hemoglobin: 14.8 g/dL (ref 13.0–17.0)
MCH: 30.3 pg (ref 26.0–34.0)
MCHC: 32.7 g/dL (ref 30.0–36.0)
MCV: 92.6 fL (ref 78.0–100.0)
Platelets: 238 10*3/uL (ref 150–400)
RBC: 4.88 MIL/uL (ref 4.22–5.81)
RDW: 12.4 % (ref 11.5–15.5)
WBC: 9.9 10*3/uL (ref 4.0–10.5)

## 2017-05-26 LAB — BASIC METABOLIC PANEL
ANION GAP: 10 (ref 5–15)
BUN: 7 mg/dL (ref 6–20)
CALCIUM: 9 mg/dL (ref 8.9–10.3)
CO2: 28 mmol/L (ref 22–32)
Chloride: 97 mmol/L — ABNORMAL LOW (ref 101–111)
Creatinine, Ser: 0.84 mg/dL (ref 0.61–1.24)
Glucose, Bld: 114 mg/dL — ABNORMAL HIGH (ref 65–99)
Potassium: 4 mmol/L (ref 3.5–5.1)
SODIUM: 135 mmol/L (ref 135–145)

## 2017-05-26 LAB — EXPECTORATED SPUTUM ASSESSMENT W GRAM STAIN, RFLX TO RESP C

## 2017-05-26 LAB — BRAIN NATRIURETIC PEPTIDE: B NATRIURETIC PEPTIDE 5: 35 pg/mL (ref 0.0–100.0)

## 2017-05-26 LAB — EXPECTORATED SPUTUM ASSESSMENT W REFEX TO RESP CULTURE

## 2017-05-26 LAB — I-STAT CG4 LACTIC ACID, ED: LACTIC ACID, VENOUS: 1.14 mmol/L (ref 0.5–1.9)

## 2017-05-26 LAB — TROPONIN I

## 2017-05-26 MED ORDER — ALBUTEROL (5 MG/ML) CONTINUOUS INHALATION SOLN
10.0000 mg/h | INHALATION_SOLUTION | RESPIRATORY_TRACT | Status: DC
  Administered 2017-05-26: 10 mg/h via RESPIRATORY_TRACT
  Filled 2017-05-26: qty 20

## 2017-05-26 MED ORDER — ORAL CARE MOUTH RINSE: 15.0000 mL | Freq: Two times a day (BID) | OROMUCOSAL | Status: DC

## 2017-05-26 MED ORDER — ALBUTEROL SULFATE (2.5 MG/3ML) 0.083% IN NEBU
INHALATION_SOLUTION | RESPIRATORY_TRACT | Status: AC
  Filled 2017-05-26: qty 3

## 2017-05-26 MED ORDER — AZITHROMYCIN 250 MG PO TABS
500.0000 mg | ORAL_TABLET | ORAL | Status: DC
  Administered 2017-05-27: 500 mg via ORAL
  Filled 2017-05-26: qty 2

## 2017-05-26 MED ORDER — SENNOSIDES-DOCUSATE SODIUM 8.6-50 MG PO TABS: 1.0000 | ORAL_TABLET | Freq: Every evening | ORAL | Status: DC | PRN

## 2017-05-26 MED ORDER — AZITHROMYCIN 250 MG PO TABS
500.0000 mg | ORAL_TABLET | Freq: Once | ORAL | Status: AC
  Administered 2017-05-26: 500 mg via ORAL
  Filled 2017-05-26: qty 2

## 2017-05-26 MED ORDER — ALBUTEROL SULFATE (2.5 MG/3ML) 0.083% IN NEBU
2.5000 mg | INHALATION_SOLUTION | RESPIRATORY_TRACT | Status: DC
  Administered 2017-05-26 – 2017-05-28 (×10): 2.5 mg via RESPIRATORY_TRACT
  Filled 2017-05-26 (×9): qty 3

## 2017-05-26 MED ORDER — DEXTROSE 5 % IV SOLN
1.0000 g | Freq: Once | INTRAVENOUS | Status: AC
  Administered 2017-05-26: 1 g via INTRAVENOUS
  Filled 2017-05-26: qty 10

## 2017-05-26 MED ORDER — ASPIRIN EC 81 MG PO TBEC
81.0000 mg | DELAYED_RELEASE_TABLET | Freq: Every day | ORAL | Status: DC
  Administered 2017-05-26 – 2017-05-31 (×7): 81 mg via ORAL
  Filled 2017-05-26 (×7): qty 1

## 2017-05-26 MED ORDER — IPRATROPIUM-ALBUTEROL 0.5-2.5 (3) MG/3ML IN SOLN: 3.0000 mL | RESPIRATORY_TRACT | Status: DC

## 2017-05-26 MED ORDER — METHYLPREDNISOLONE SODIUM SUCC 125 MG IJ SOLR
125.0000 mg | Freq: Once | INTRAMUSCULAR | Status: AC
  Administered 2017-05-26: 125 mg via INTRAVENOUS
  Filled 2017-05-26: qty 2

## 2017-05-26 MED ORDER — ONDANSETRON HCL 4 MG/2ML IJ SOLN: 4.0000 mg | Freq: Four times a day (QID) | INTRAMUSCULAR | Status: DC | PRN

## 2017-05-26 MED ORDER — MOMETASONE FURO-FORMOTEROL FUM 200-5 MCG/ACT IN AERO
2.0000 | INHALATION_SPRAY | Freq: Two times a day (BID) | RESPIRATORY_TRACT | Status: DC
  Administered 2017-05-26 – 2017-05-28 (×4): 2 via RESPIRATORY_TRACT
  Filled 2017-05-26: qty 8.8

## 2017-05-26 MED ORDER — TIOTROPIUM BROMIDE MONOHYDRATE 18 MCG IN CAPS
1.0000 | ORAL_CAPSULE | Freq: Every day | RESPIRATORY_TRACT | Status: DC
  Administered 2017-05-27 – 2017-05-28 (×2): 18 ug via RESPIRATORY_TRACT
  Filled 2017-05-26: qty 5

## 2017-05-26 MED ORDER — ACETAMINOPHEN 325 MG PO TABS: 650.0000 mg | ORAL_TABLET | Freq: Four times a day (QID) | ORAL | Status: DC | PRN

## 2017-05-26 MED ORDER — ENOXAPARIN SODIUM 40 MG/0.4ML ~~LOC~~ SOLN
40.0000 mg | SUBCUTANEOUS | Status: DC
  Administered 2017-05-27 – 2017-05-30 (×4): 40 mg via SUBCUTANEOUS
  Filled 2017-05-26 (×5): qty 0.4

## 2017-05-26 MED ORDER — ONDANSETRON HCL 4 MG PO TABS: 4.0000 mg | ORAL_TABLET | Freq: Four times a day (QID) | ORAL | Status: DC | PRN

## 2017-05-26 MED ORDER — ACETAMINOPHEN 650 MG RE SUPP: 650.0000 mg | Freq: Four times a day (QID) | RECTAL | Status: DC | PRN

## 2017-05-26 MED ORDER — DEXTROSE 5 % IV SOLN
1.0000 g | INTRAVENOUS | Status: DC
  Administered 2017-05-27 – 2017-05-30 (×4): 1 g via INTRAVENOUS
  Filled 2017-05-26 (×7): qty 10

## 2017-05-26 MED ORDER — SODIUM CHLORIDE 0.9 % IV SOLN
INTRAVENOUS | Status: DC
  Administered 2017-05-26 – 2017-05-29 (×6): via INTRAVENOUS

## 2017-05-26 MED ORDER — METHYLPREDNISOLONE SODIUM SUCC 125 MG IJ SOLR
60.0000 mg | Freq: Four times a day (QID) | INTRAMUSCULAR | Status: DC
  Administered 2017-05-26 – 2017-05-31 (×20): 60 mg via INTRAVENOUS
  Filled 2017-05-26 (×20): qty 2

## 2017-05-26 NOTE — ED Notes (Signed)
Pt did not want to ambulate and stated he "can not catch his breath sitting here so I know I won't be able to walking."

## 2017-05-26 NOTE — H&P (Signed)
History and Physical    Tony DraftsFrank A Dicker [id number] DOB: Jun 04, 1961 DOA: 05/26/2017  Referring MD/NP/PA: Jacalyn LefevreJulie Haviland, EDP PCP: Selinda FlavinHoward, Kevin, MD  Patient coming from: Home  Chief Complaint: Shortness of breath, cough  HPI: Tony [name] is a 56 y.o. male with history of COPD and tobacco abuse who states he quit smoking 3 days ago.  He is a [employer] architecttruck driver and was on his most recent trip when he started developing shortness of breath, cough productive of yellowish sputum.  He denies chest pain or palpitations.  Denies fever or chills.  He has been using his albuterol more frequently.  He decides to come into the hospital today for evaluation.  Vital signs show initial tachycardia that has resolved with IV fluids, is requiring 2 L of oxygen which is not his baseline, chest x-ray shows early interstitial pneumonia on the left superimposed on significant emphysematous changes.  CBC not done in the ED. Admission has been requested.  Past Medical/Surgical History: Past Medical History:  Diagnosis Date  . Asthma   . Bronchiolitis   . COPD (chronic obstructive pulmonary disease) (HCC)   . Emphysema   . Shortness of breath     Past Surgical History:  Procedure Laterality Date  . COLONOSCOPY N/A 08/17/2012   Procedure: COLONOSCOPY;  Surgeon: Corbin Adeobert M Rourk, MD;  Location: [facility];  Service: Endoscopy;  Laterality: N/A;  9:30 AM  . CYST EXCISION Right    posterior neck- 56 years old    Social History:  reports that he has been smoking cigarettes.  He has a 20.00 pack-year smoking history. he has never used smokeless tobacco. He reports that he does not drink alcohol or use drugs.  Allergies: Allergies  Allergen Reactions  . Bee Venom Anaphylaxis    Family History:  Family History  Problem Relation Age of Onset  . Cancer Mother   . COPD Brother     Prior to Admission medications   Medication Sig Start Date End Date Taking? Authorizing Provider  albuterol (PROVENTIL  HFA;VENTOLIN HFA) 108 (90 BASE) MCG/ACT inhaler Inhale 2 puffs into the lungs every 6 (six) hours as needed. Shortness Of Breath   Yes [provider]  albuterol (PROVENTIL) (2.5 MG/3ML) 0.083% nebulizer solution Take 2.5 mg by nebulization every 6 (six) hours as needed for wheezing or shortness of breath.   Yes [provider]  aspirin EC 81 MG tablet Take 81 mg by mouth daily.   Yes [provider]  budesonide-formoterol (SYMBICORT) 160-4.5 MCG/ACT inhaler Inhale 2 puffs into the lungs 2 (two) times daily. 05/20/16  Yes Nyoka CowdenWert, Michael B, MD  EPINEPHrine 0.3 mg/0.3 mL IJ SOAJ injection Inject 0.3 mg into the muscle once.   Yes [provider]  Tiotropium Bromide Monohydrate (SPIRIVA RESPIMAT) 2.5 MCG/ACT AERS Inhale 2 puffs into the lungs daily. 07/08/16  Yes Nyoka CowdenWert, Michael B, MD    Review of Systems:  Constitutional: Denies fever, chills, diaphoresis, appetite change and fatigue.  HEENT: Denies photophobia, eye pain, redness, hearing loss, ear pain, congestion, sore throat, rhinorrhea, sneezing, mouth sores, trouble swallowing, neck pain, neck stiffness and tinnitus.   Respiratory: Denies chest tightness,  and wheezing.   Cardiovascular: Denies chest pain, palpitations and leg swelling.  Gastrointestinal: Denies nausea, vomiting, abdominal pain, diarrhea, constipation, blood in stool and abdominal distention.  Genitourinary: Denies dysuria, urgency, frequency, hematuria, flank pain and difficulty urinating.  Endocrine: Denies: hot or cold intolerance, sweats, changes in hair or nails, polyuria, polydipsia. Musculoskeletal: Denies myalgias,  back pain, joint swelling, arthralgias and gait problem.  Skin: Denies pallor, rash and wound.  Neurological: Denies dizziness, seizures, syncope, weakness, light-headedness, numbness and headaches.  Hematological: Denies adenopathy. Easy bruising, personal or family bleeding history  Psychiatric/Behavioral: Denies suicidal  ideation, mood changes, confusion, nervousness, sleep disturbance and agitation    Physical Exam: Vitals:   05/26/17 1100 05/26/17 1130 05/26/17 1151 05/26/17 1410  BP: 123/85  127/80 116/81  Pulse: 97  (!) 106 88  Resp: 18  18 18   Temp:   98.8 F (37.1 C) 98.6 F (37 C)  TempSrc:   Oral Oral  SpO2: 94% 95% 94% 97%  Weight:   52.6 kg (116 lb)   Height:   5\' 4"  (1.626 m)      Constitutional: NAD, calm, comfortable Eyes: PERRL, lids and conjunctivae normal ENMT: Mucous membranes are moist. Posterior pharynx clear of any exudate or lesions.Normal dentition.  Neck: normal, supple, no masses, no thyromegaly Respiratory: clear to auscultation bilaterally, no wheezing, no crackles. Normal respiratory effort. No accessory muscle use.  Cardiovascular: Regular rate and rhythm, no murmurs / rubs / gallops. No extremity edema. 2+ pedal pulses. No carotid bruits.  Abdomen: no tenderness, no masses palpated. No hepatosplenomegaly. Bowel sounds positive.  Musculoskeletal: no clubbing / cyanosis. No joint deformity upper and lower extremities. Good ROM, no contractures. Normal muscle tone.  Skin: no rashes, lesions, ulcers. No induration Neurologic: CN 2-12 grossly intact. Sensation intact, DTR normal. Strength 5/5 in all 4.  Psychiatric: Normal judgment and insight. Alert and oriented x 3. Normal mood.    Labs on Admission: I have personally reviewed the following labs and imaging studies  CBC: No results for input(s): WBC, NEUTROABS, HGB, HCT, MCV, PLT in the last 168 hours. Basic Metabolic Panel: Recent Labs  Lab 05/26/17 0835  NA 135  K 4.0  CL 97*  CO2 28  GLUCOSE 114*  BUN 7  CREATININE 0.84  CALCIUM 9.0   GFR: Estimated Creatinine Clearance: 73.1 mL/min (by C-G formula based on SCr of 0.84 mg/dL). Liver Function Tests: No results for input(s): AST, ALT, ALKPHOS, BILITOT, PROT, ALBUMIN in the last 168 hours. No results for input(s): LIPASE, AMYLASE in the last 168  hours. No results for input(s): AMMONIA in the last 168 hours. Coagulation Profile: No results for input(s): INR, PROTIME in the last 168 hours. Cardiac Enzymes: Recent Labs  Lab 05/26/17 0835  TROPONINI <0.03   BNP (last 3 results) No results for input(s): PROBNP in the last 8760 hours. HbA1C: No results for input(s): HGBA1C in the last 72 hours. CBG: No results for input(s): GLUCAP in the last 168 hours. Lipid Profile: No results for input(s): CHOL, HDL, LDLCALC, TRIG, CHOLHDL, LDLDIRECT in the last 72 hours. Thyroid Function Tests: No results for input(s): TSH, T4TOTAL, FREET4, T3FREE, THYROIDAB in the last 72 hours. Anemia Panel: No results for input(s): VITAMINB12, FOLATE, FERRITIN, TIBC, IRON, RETICCTPCT in the last 72 hours. Urine analysis:    Component Value Date/Time   COLORURINE YELLOW 03/02/2010 0852   APPEARANCEUR CLEAR 03/02/2010 0852   LABSPEC 1.005 03/02/2010 0852   PHURINE 6.5 03/02/2010 0852   GLUCOSEU NEGATIVE 03/02/2010 0852   HGBUR SMALL (A) 03/02/2010 0852   BILIRUBINUR NEGATIVE 03/02/2010 0852   KETONESUR NEGATIVE 03/02/2010 0852   PROTEINUR NEGATIVE 03/02/2010 0852   UROBILINOGEN 0.2 03/02/2010 0852   NITRITE NEGATIVE 03/02/2010 0852   LEUKOCYTESUR TRACE (A) 03/02/2010 0852   Sepsis Labs: @LABRCNTIP (procalcitonin:4,lacticidven:4) ) Recent Results (from the past 240 hour(s))  Culture, blood (routine x 2)     Status: None (Preliminary result)   Collection Time: 05/26/17  9:15 AM  Result Value Ref Range Status   Specimen Description BLOOD RIGHT ARM  Final   Special Requests   Final    BOTTLES DRAWN AEROBIC AND ANAEROBIC Blood Culture adequate volume   Culture PENDING  Incomplete   Report Status PENDING  Incomplete  Culture, blood (routine x 2)     Status: None (Preliminary result)   Collection Time: 05/26/17  9:25 AM  Result Value Ref Range Status   Specimen Description BLOOD RIGHT ARM  Final   Special Requests   Final    BOTTLES DRAWN  AEROBIC AND ANAEROBIC Blood Culture adequate volume   Culture PENDING  Incomplete   Report Status PENDING  Incomplete     Radiological Exams on Admission: Dg Chest Portable 1 View  Result Date: 05/26/2017 CLINICAL DATA:  Productive cough and shortness of breath since yesterday. History of asthma -COPD, current smoker. EXAM: PORTABLE CHEST 1 VIEW COMPARISON:  Chest x-ray of May 20, 2016 FINDINGS: The lungs are hyperinflated with hemidiaphragm flattening. There is subtle increased density in the left lung in the upper, mid, and lower perihilar regions. There is no pleural effusion or pneumothorax. The heart and pulmonary vascularity are normal. IMPRESSION: Probable subsegmental atelectasis or early interstitial pneumonia on the left superimposed upon significant emphysematous changes. No overt CHF. Followup PA and lateral chest X-ray is recommended in 3-4 weeks following trial of antibiotic therapy to ensure resolution and exclude underlying malignancy. Electronically Signed   By: David  Swazil[place]Jordan M.D.   On: 05/26/2017 08:36    EKG: Independently reviewed. Sinus tachycardia  Assessment/Plan Principal Problem:   Acute on chronic respiratory failure with hypoxia (HCC) Active Problems:   COPD exacerbation (HCC)   Cigarette smoker   CAP (community acquired pneumonia)    Acute on chronic hypoxemic respiratory failure -On account of COPD with acute exacerbation as well as community-acquired pneumonia. -Placed on IV Solu-Medrol, frequent nebs. -Rocephin/azithromycin. -Blood/sputum cultures requested. -Strep pneumo/Legionella urine antigens requested. -Even though he was hypoxemic and tachycardic, I believe we do not need to chase down  PE given we have clear evidence for pneumonia, if he fails to improve with treatment of pneumonia can consider CT of the chest to rule out PE.  Tobacco abuse -He refuses nicotine patch, states he quit smoking 2 days ago.   DVT prophylaxis: Lovenox Code  Status: Full code Family Communication: Patient only Disposition Plan: Home once medically improved anticipate 48-72 hours Consults called: None Admission status: Inpatient   Time Spent: 85 minutes  Estela Philip AspenHernandez Acosta MD [facility] Pager [phone]  If 7PM-7AM, please contact night-coverage [email or web address] Password [email or web address]  05/26/2017, 2:30 PM

## 2017-05-26 NOTE — ED Notes (Signed)
Report given to Morgan, RN

## 2017-05-26 NOTE — Progress Notes (Signed)
Pt was not started on the continuous neb treatment in the ER before being transferred to the floor.  Neb discontinued and MD will order regular scheduled nebs.  RT will continue to monitor.

## 2017-05-26 NOTE — ED Triage Notes (Signed)
Productive cough and SOB  since yesterday.

## 2017-05-26 NOTE — ED Provider Notes (Signed)
[facility] EMERGENCY DEPARTMENT Provider Note   CSN: [id number] Arrival date & time: 05/26/17  [id number]     History   Chief Complaint Chief Complaint  Patient presents with  . Shortness of Breath    HPI Tony [name] is a 56 y.o. male.  Pt presents to the ED today with sob.  The pt has a hx of COPD and said his normal inhalers are not helping.  The pt said he has been real bad since yesterday, but sx started on the 29th when he was out in the cold riding his tractor.  The pt said he quit smoking 2 days ago when he could not smoke due to the SOB.  Pt denies any fevers.      Past Medical History:  Diagnosis Date  . Asthma   . Bronchiolitis   . COPD (chronic obstructive pulmonary disease) (HCC)   . Emphysema   . Shortness of breath     Patient Active Problem List   Diagnosis Date Noted  . Cigarette smoker 05/20/2016  . 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 COPD GOLD IV / still smoking  07/31/2012  . Hypokalemia 07/31/2012  . Dyspnea 07/31/2012  . Tobacco use 07/31/2012  . Weight loss 07/31/2012  . OPEN FRACTURE DISTAL PHALANX OR PHALANGES HAND 07/06/2007    Past Surgical History:  Procedure Laterality Date  . COLONOSCOPY N/A 08/17/2012   Procedure: COLONOSCOPY;  Surgeon: Corbin Adeobert M Rourk, MD;  Location: [facility];  Service: Endoscopy;  Laterality: N/A;  9:30 AM  . CYST EXCISION Right    posterior neck- 56 years old       Home Medications    Prior to Admission medications   Medication Sig Start Date End Date Taking? Authorizing Provider  albuterol (PROVENTIL HFA;VENTOLIN HFA) 108 (90 BASE) MCG/ACT inhaler Inhale 2 puffs into the lungs every 6 (six) hours as needed. Shortness Of Breath   Yes [provider]  albuterol (PROVENTIL) (2.5 MG/3ML) 0.083% nebulizer solution Take 2.5 mg by nebulization every 6 (six) hours as needed for wheezing or shortness of breath.   Yes [provider]  aspirin EC 81 MG  tablet Take 81 mg by mouth daily.   Yes [provider]  budesonide-formoterol (SYMBICORT) 160-4.5 MCG/ACT inhaler Inhale 2 puffs into the lungs 2 (two) times daily. 05/20/16  Yes Nyoka CowdenWert, Michael B, MD  EPINEPHrine 0.3 mg/0.3 mL IJ SOAJ injection Inject 0.3 mg into the muscle once.   Yes [provider]  Tiotropium Bromide Monohydrate (SPIRIVA RESPIMAT) 2.5 MCG/ACT AERS Inhale 2 puffs into the lungs daily. 07/08/16  Yes Nyoka CowdenWert, Michael B, MD    Family History Family History  Problem Relation Age of Onset  . Cancer Mother   . COPD Brother     Social History Social History   Tobacco Use  . Smoking status: Current Every Day Smoker    Packs/day: 0.50    Years: 40.00    Pack years: 20.00    Types: Cigarettes  . Smokeless tobacco: Never Used  Substance Use Topics  . Alcohol use: No    Alcohol/week: 0.0 oz  . Drug use: No     Allergies   Bee venom   Review of Systems Review of Systems  Respiratory: Positive for cough, shortness of breath and wheezing.   All other systems reviewed and are negative.    Physical Exam Updated Vital Signs BP (!) 118/97   Pulse (!) 107   Temp  99.1 F (37.3 C) (Oral)   Resp 20   Ht 5\' 4"  (1.626 m)   Wt 52.6 kg (116 lb)   SpO2 95%   BMI 19.91 kg/m   Physical Exam  Constitutional: He appears well-developed. He appears distressed.  HENT:  Head: Normocephalic and atraumatic.  Mouth/Throat: Oropharynx is clear and moist.  Eyes: EOM are normal. Pupils are equal, round, and reactive to light.  Neck: Normal range of motion. Neck supple.  Cardiovascular: Tachycardia present.  Pulmonary/Chest: Accessory muscle usage present. Tachypnea noted. He has wheezes.  Abdominal: Soft. Bowel sounds are normal.  Musculoskeletal: Normal range of motion.       Right lower leg: Normal.       Left lower leg: Normal.  Neurological: He is alert.  Skin: Skin is warm and dry. Capillary refill takes less than 2 seconds.  Psychiatric: He has a  normal mood and affect. His behavior is normal.  Nursing note and vitals reviewed.    ED Treatments / Results  Labs (all labs ordered are listed, but only abnormal results are displayed) Labs Reviewed  BASIC METABOLIC PANEL - Abnormal; Notable for the following components:      Result Value   Chloride 97 (*)    Glucose, Bld 114 (*)    All other components within normal limits  CULTURE, BLOOD (ROUTINE X 2)  CULTURE, BLOOD (ROUTINE X 2)  BRAIN NATRIURETIC PEPTIDE  TROPONIN I  INFLUENZA PANEL BY PCR (TYPE A & B)  I-STAT CG4 LACTIC ACID, ED  I-STAT CG4 LACTIC ACID, ED    EKG  EKG Interpretation  Date/Time:  Monday May 26 2017 08:15:13 EST Ventricular Rate:  118 PR Interval:    QRS Duration: 88 QT Interval:  339 QTC Calculation: 411 R Axis:   -83 Text Interpretation:  Sinus tachycardia Paired ventricular premature complexes Right atrial enlargement LAD, consider left anterior fascicular block Borderline repolarization abnormality No significant change since last tracing Confirmed by Jacalyn Lefevre [phone]) on 05/26/2017 8:19:25 AM       Radiology Dg Chest Portable 1 View  Result Date: 05/26/2017 CLINICAL DATA:  Productive cough and shortness of breath since yesterday. History of asthma -COPD, current smoker. EXAM: PORTABLE CHEST 1 VIEW COMPARISON:  Chest x-ray of May 20, 2016 FINDINGS: The lungs are hyperinflated with hemidiaphragm flattening. There is subtle increased density in the left lung in the upper, mid, and lower perihilar regions. There is no pleural effusion or pneumothorax. The heart and pulmonary vascularity are normal. IMPRESSION: Probable subsegmental atelectasis or early interstitial pneumonia on the left superimposed upon significant emphysematous changes. No overt CHF. Followup PA and lateral chest X-ray is recommended in 3-4 weeks following trial of antibiotic therapy to ensure resolution and exclude underlying malignancy. Electronically Signed   By:  David  [place] M.D.   On: 05/26/2017 08:36    Procedures Procedures (including critical care time)  Medications Ordered in ED Medications  albuterol (PROVENTIL,VENTOLIN) solution continuous neb (0 mg/hr Nebulization Stopped 05/26/17 1013)  albuterol (PROVENTIL,VENTOLIN) solution continuous neb (not administered)  methylPREDNISolone sodium succinate (SOLU-MEDROL) 125 mg/2 mL injection 125 mg (125 mg Intravenous Given 05/26/17 0847)  cefTRIAXone (ROCEPHIN) 1 g in dextrose 5 % 50 mL IVPB (1 g Intravenous New Bag/Given 05/26/17 1009)  azithromycin (ZITHROMAX) tablet 500 mg (500 mg Oral Given 05/26/17 1009)     Initial Impression / Assessment and Plan / ED Course  I have reviewed the triage vital signs and the nursing notes.  Pertinent labs & imaging results that  were available during my care of the patient were reviewed by me and considered in my medical decision making (see chart for details).    Pt has opened up some after his first continuous neb, but is still very sob.  He still feels like he can't catch his breath.  The pt given another continuous neb.  Pt given rocephin and zithromax for possible pna seen on CXR.  Pt d/w Dr. Ardyth HarpsHernandez ([facility]) for admission.  Final Clinical Impressions(s) / ED Diagnoses   Final diagnoses:  COPD exacerbation (HCC)  Community acquired pneumonia of left lower lobe of lung (HCC)  Tobacco abuse    ED Discharge Orders    None       Jacalyn LefevreHaviland, Airyana Sprunger, MD 05/26/17 1100

## 2017-05-27 LAB — BASIC METABOLIC PANEL
ANION GAP: 6 (ref 5–15)
BUN: 11 mg/dL (ref 6–20)
CALCIUM: 8.7 mg/dL — AB (ref 8.9–10.3)
CO2: 29 mmol/L (ref 22–32)
Chloride: 101 mmol/L (ref 101–111)
Creatinine, Ser: 0.68 mg/dL (ref 0.61–1.24)
GFR calc Af Amer: >60 mL/min (ref >60)
GLUCOSE: 159 mg/dL — AB (ref 65–99)
Potassium: 3.6 mmol/L (ref 3.5–5.1)
SODIUM: 136 mmol/L (ref 135–145)

## 2017-05-27 LAB — CBC
HCT: 40.4 % (ref 39.0–52.0)
Hemoglobin: 13 g/dL (ref 13.0–17.0)
MCH: 30 pg (ref 26.0–34.0)
MCHC: 32.2 g/dL (ref 30.0–36.0)
MCV: 93.1 fL (ref 78.0–100.0)
Platelets: 210 10*3/uL (ref 150–400)
RBC: 4.34 MIL/uL (ref 4.22–5.81)
RDW: 12.3 % (ref 11.5–15.5)
WBC: 9.4 10*3/uL (ref 4.0–10.5)

## 2017-05-27 LAB — HIV ANTIBODY (ROUTINE TESTING W REFLEX): HIV SCREEN 4TH GENERATION: NONREACTIVE

## 2017-05-27 LAB — STREP PNEUMONIAE URINARY ANTIGEN: STREP PNEUMO URINARY ANTIGEN: POSITIVE — AB

## 2017-05-27 MED ORDER — LORAZEPAM 2 MG/ML IJ SOLN
0.5000 mg | Freq: Once | INTRAMUSCULAR | Status: AC
  Administered 2017-05-27: 0.5 mg via INTRAVENOUS
  Filled 2017-05-27: qty 1

## 2017-05-27 NOTE — Progress Notes (Signed)
PROGRESS NOTE    Tony [name]  ZOX:[id number] DOB: October 21, 1960 DOA: 05/26/2017 PCP: Selinda FlavinHoward, Kevin, MD     Brief Narrative:  56 year old man admitted from home on 12/3 due to cough and shortness of breath.  He has a history of COPD and tobacco abuse.  He was found to have early interstitial pneumonia as well as COPD exacerbation and admission was requested.   Assessment & Plan:   Principal Problem:   Acute on chronic respiratory failure with hypoxia (HCC) Active Problems:   COPD exacerbation (HCC)   Cigarette smoker   CAP (community acquired pneumonia)   Acute on chronic respiratory failure with hypoxemia -On account of COPD with acute exacerbation as well as community-acquired pneumonia. -Continue IV steroids, frequent nebs. -Strep pneumo urinary antigen is positive, will continue Rocephin and discontinue azithromycin. -He feels he is improved, however still significantly short of breath especially with ambulation.  Tobacco abuse -Refuses nicotine patch. -Counseled on cessation  -he states he "quit smoking" 3 days ago.   DVT prophylaxis:  Lovenox Code Status: Full code Family Communication: Patient only Disposition Plan: Home when ready anticipate 48-72 hours  Consultants:   None  Procedures:   None  Antimicrobials:  Anti-infectives (From admission, onward)   Start     Dose/Rate Route Frequency Ordered Stop   05/27/17 1000  cefTRIAXone (ROCEPHIN) 1 g in dextrose 5 % 50 mL IVPB     1 g 100 mL/hr over 30 Minutes Intravenous Every 24 hours 05/26/17 1429 06/03/17 0959   05/27/17 1000  azithromycin (ZITHROMAX) tablet 500 mg     500 mg Oral Every 24 hours 05/26/17 1429 06/03/17 0959   05/26/17 0900  cefTRIAXone (ROCEPHIN) 1 g in dextrose 5 % 50 mL IVPB     1 g 100 mL/hr over 30 Minutes Intravenous  Once 05/26/17 0854 05/26/17 1112   05/26/17 0900  azithromycin (ZITHROMAX) tablet 500 mg     500 mg Oral  Once 05/26/17 0854 05/26/17 1009        Subjective: Feels a little better, still short of breath with ambulation  Objective: Vitals:   05/27/17 0641 05/27/17 1005 05/27/17 1131 05/27/17 1416  BP: 120/78   119/85  Pulse: 66   (!) 109  Resp: 20   (!) 22  Temp: 97.6 F (36.4 C)   98.2 F (36.8 C)  TempSrc: Oral   Oral  SpO2: 100% 98% 100% 98%  Weight:      Height:        Intake/Output Summary (Last 24 hours) at 05/27/2017 1455 Last data filed at 05/27/2017 1126 Gross per 24 hour  Intake 2220 ml  Output -  Net 2220 ml   Filed Weights   05/26/17 0811 05/26/17 1151  Weight: 52.6 kg (116 lb) 52.6 kg (116 lb)    Examination:  General exam: Alert, awake, oriented x 3 Respiratory system: Fair air movement, minimal expiratory wheezes. Cardiovascular system:RRR. No murmurs, rubs, gallops. Gastrointestinal system: Abdomen is nondistended, soft and nontender. No organomegaly or masses felt. Normal bowel sounds heard. Central nervous system: Alert and oriented. No focal neurological deficits. Extremities: No C/C/E, +pedal pulses Skin: No rashes, lesions or ulcers Psychiatry: Judgement and insight appear normal. Mood & affect appropriate.     Data Reviewed: I have personally reviewed following labs and imaging studies  CBC: Recent Labs  Lab 05/26/17 1451 05/27/17 0629  WBC 9.9 9.4  HGB 14.8 13.0  HCT 45.2 40.4  MCV 92.6 93.1  PLT 238 210  Basic Metabolic Panel: Recent Labs  Lab 05/26/17 0835 05/27/17 0629  NA 135 136  K 4.0 3.6  CL 97* 101  CO2 28 29  GLUCOSE 114* 159*  BUN 7 11  CREATININE 0.84 0.68  CALCIUM 9.0 8.7*   GFR: Estimated Creatinine Clearance: 76.7 mL/min (by C-G formula based on SCr of 0.68 mg/dL). Liver Function Tests: No results for input(s): AST, ALT, ALKPHOS, BILITOT, PROT, ALBUMIN in the last 168 hours. No results for input(s): LIPASE, AMYLASE in the last 168 hours. No results for input(s): AMMONIA in the last 168 hours. Coagulation Profile: No results for input(s):  INR, PROTIME in the last 168 hours. Cardiac Enzymes: Recent Labs  Lab 05/26/17 0835  TROPONINI <0.03   BNP (last 3 results) No results for input(s): PROBNP in the last 8760 hours. HbA1C: No results for input(s): HGBA1C in the last 72 hours. CBG: No results for input(s): GLUCAP in the last 168 hours. Lipid Profile: No results for input(s): CHOL, HDL, LDLCALC, TRIG, CHOLHDL, LDLDIRECT in the last 72 hours. Thyroid Function Tests: No results for input(s): TSH, T4TOTAL, FREET4, T3FREE, THYROIDAB in the last 72 hours. Anemia Panel: No results for input(s): VITAMINB12, FOLATE, FERRITIN, TIBC, IRON, RETICCTPCT in the last 72 hours. Urine analysis:    Component Value Date/Time   COLORURINE YELLOW 03/02/2010 0852   APPEARANCEUR CLEAR 03/02/2010 0852   LABSPEC 1.005 03/02/2010 0852   PHURINE 6.5 03/02/2010 0852   GLUCOSEU NEGATIVE 03/02/2010 0852   HGBUR SMALL (A) 03/02/2010 0852   BILIRUBINUR NEGATIVE 03/02/2010 0852   KETONESUR NEGATIVE 03/02/2010 0852   PROTEINUR NEGATIVE 03/02/2010 0852   UROBILINOGEN 0.2 03/02/2010 0852   NITRITE NEGATIVE 03/02/2010 0852   LEUKOCYTESUR TRACE (A) 03/02/2010 0852   Sepsis Labs: @LABRCNTIP (procalcitonin:4,lacticidven:4)  ) Recent Results (from the past 240 hour(s))  Culture, blood (routine x 2)     Status: None (Preliminary result)   Collection Time: 05/26/17  9:15 AM  Result Value Ref Range Status   Specimen Description BLOOD RIGHT ARM  Final   Special Requests   Final    BOTTLES DRAWN AEROBIC AND ANAEROBIC Blood Culture adequate volume   Culture NO GROWTH 1 DAY  Final   Report Status PENDING  Incomplete  Culture, blood (routine x 2)     Status: None (Preliminary result)   Collection Time: 05/26/17  9:25 AM  Result Value Ref Range Status   Specimen Description BLOOD RIGHT ARM  Final   Special Requests   Final    BOTTLES DRAWN AEROBIC AND ANAEROBIC Blood Culture adequate volume   Culture  Setup Time   Final    NO ORGANISMS SEEN AEROBIC  BOTTLE PLATED AND RELOADED ONTO INSTRUMENT   Culture NO GROWTH 1 DAY  Final   Report Status PENDING  Incomplete  Culture, sputum-assessment     Status: None   Collection Time: 05/26/17  2:26 PM  Result Value Ref Range Status   Specimen Description EXPECTORATED SPUTUM  Final   Special Requests NONE  Final   Sputum evaluation   Final    THIS SPECIMEN IS ACCEPTABLE FOR SPUTUM CULTURE Performed at [facility]    Report Status 05/26/2017 FINAL  Final  Culture, respiratory (NON-Expectorated)     Status: None (Preliminary result)   Collection Time: 05/26/17  2:26 PM  Result Value Ref Range Status   Specimen Description EXPECTORATED SPUTUM  Final   Special Requests NONE Reflexed from [id number]  Final   Gram Stain   Final    FEW  WBC PRESENT, PREDOMINANTLY PMN FEW GRAM POSITIVE COCCI IN PAIRS IN CHAINS RARE GRAM POSITIVE RODS RARE GRAM NEGATIVE RODS Performed at [facility], [address] [place], [place] [address]    Culture PENDING  Incomplete   Report Status PENDING  Incomplete         Radiology Studies: Dg Chest Portable 1 View  Result Date: 05/26/2017 CLINICAL DATA:  Productive cough and shortness of breath since yesterday. History of asthma -COPD, current smoker. EXAM: PORTABLE CHEST 1 VIEW COMPARISON:  Chest x-ray of May 20, 2016 FINDINGS: The lungs are hyperinflated with hemidiaphragm flattening. There is subtle increased density in the left lung in the upper, mid, and lower perihilar regions. There is no pleural effusion or pneumothorax. The heart and pulmonary vascularity are normal. IMPRESSION: Probable subsegmental atelectasis or early interstitial pneumonia on the left superimposed upon significant emphysematous changes. No overt CHF. Followup PA and lateral chest X-ray is recommended in 3-4 weeks following trial of antibiotic therapy to ensure resolution and exclude underlying malignancy. Electronically Signed   By: David  [place] M.D.   On: 05/26/2017 08:36         Scheduled Meds: . albuterol  2.5 mg Nebulization Q4H  . aspirin EC  81 mg Oral Daily  . azithromycin  500 mg Oral Q24H  . enoxaparin (LOVENOX) injection  40 mg Subcutaneous Q24H  . methylPREDNISolone (SOLU-MEDROL) injection  60 mg Intravenous Q6H  . mometasone-formoterol  2 puff Inhalation BID  . tiotropium  1 capsule Inhalation Daily   Continuous Infusions: . sodium chloride 75 mL/hr at 05/27/17 0450  . cefTRIAXone (ROCEPHIN)  IV Stopped (05/27/17 1126)     LOS: 1 day    Time spent: 25 minutes. Greater than 50% of this time was spent in direct contact with the patient coordinating care.     Chaya JanEstela Hernandez Acosta, MD [facility] Pager [phone]  If 7PM-7AM, please contact night-coverage [email or web address] Password [email or web address] 05/27/2017, 2:55 PM

## 2017-05-28 DIAGNOSIS — F17200 Nicotine dependence, unspecified, uncomplicated: Secondary | ICD-10-CM

## 2017-05-28 DIAGNOSIS — J9601 Acute respiratory failure with hypoxia: Secondary | ICD-10-CM

## 2017-05-28 DIAGNOSIS — Z720 Tobacco use: Secondary | ICD-10-CM

## 2017-05-28 LAB — LEGIONELLA PNEUMOPHILA SEROGP 1 UR AG: L. pneumophila Serogp 1 Ur Ag: NEGATIVE

## 2017-05-28 MED ORDER — LORAZEPAM 1 MG PO TABS
1.0000 mg | ORAL_TABLET | Freq: Four times a day (QID) | ORAL | Status: DC | PRN
  Administered 2017-05-29 (×2): 1 mg via ORAL
  Filled 2017-05-28 (×2): qty 1

## 2017-05-28 MED ORDER — LORAZEPAM 2 MG/ML IJ SOLN: 1.0000 mg | Freq: Four times a day (QID) | INTRAMUSCULAR | Status: DC | PRN

## 2017-05-28 MED ORDER — IPRATROPIUM-ALBUTEROL 0.5-2.5 (3) MG/3ML IN SOLN
3.0000 mL | RESPIRATORY_TRACT | Status: DC
  Administered 2017-05-28 – 2017-05-31 (×16): 3 mL via RESPIRATORY_TRACT
  Filled 2017-05-28 (×15): qty 3

## 2017-05-28 MED ORDER — VITAMIN B-1 100 MG PO TABS
100.0000 mg | ORAL_TABLET | Freq: Every day | ORAL | Status: DC
  Administered 2017-05-29 – 2017-05-31 (×3): 100 mg via ORAL
  Filled 2017-05-28 (×3): qty 1

## 2017-05-28 MED ORDER — ALBUTEROL SULFATE (2.5 MG/3ML) 0.083% IN NEBU: 2.5000 mg | INHALATION_SOLUTION | RESPIRATORY_TRACT | Status: DC | PRN

## 2017-05-28 MED ORDER — THIAMINE HCL 100 MG/ML IJ SOLN
100.0000 mg | Freq: Every day | INTRAMUSCULAR | Status: DC
  Filled 2017-05-28 (×2): qty 2

## 2017-05-28 MED ORDER — ADULT MULTIVITAMIN W/MINERALS CH
1.0000 | ORAL_TABLET | Freq: Every day | ORAL | Status: DC
  Administered 2017-05-29 – 2017-05-31 (×3): 1 via ORAL
  Filled 2017-05-28 (×3): qty 1

## 2017-05-28 MED ORDER — BUDESONIDE 0.5 MG/2ML IN SUSP
0.5000 mg | Freq: Two times a day (BID) | RESPIRATORY_TRACT | Status: DC
  Administered 2017-05-28 – 2017-05-31 (×6): 0.5 mg via RESPIRATORY_TRACT
  Filled 2017-05-28 (×6): qty 2

## 2017-05-28 MED ORDER — FOLIC ACID 1 MG PO TABS
1.0000 mg | ORAL_TABLET | Freq: Every day | ORAL | Status: DC
  Administered 2017-05-29 – 2017-05-31 (×3): 1 mg via ORAL
  Filled 2017-05-28 (×3): qty 1

## 2017-05-28 NOTE — Progress Notes (Signed)
Patient gets very sob with exertion.  Unable to walk without have difficulty with respirations and patient becomes very panicked upon exertion.

## 2017-05-28 NOTE — Progress Notes (Signed)
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  He complains of worsen nonproductive cough without fever, chills, hemoptysis.  Initial CXR showed atelectasis vs early interstitial pneumonia.  The patient was started on IV solumedrol, Duonebs and antibiotics.  Assessment/Plan: Acute respiratory failure with hypoxia -secondary to COPD exacerbation -presently stable on 3L [place] -very little functional reserve--dyspneic with minimal exertion  COPD Exacerbation -change to DuoNebs q 4 hours -d/c spiriva -continue solu-medrol -start pulmicort -continue ceftriaxone for now -personally reviewed CXR--no consolidation -do not feel pt has pneumonia--urine strep pneumoniae antigen nonspecific  Tobacco abuse -cessation discussed -nicotine patch  Poor Health Literacy -pt cannot read or write   Disposition Plan:   Home in 2-3 days  Family Communication:   Family at bedside updated 12/5--Total time spent 35 minutes.  Greater than 50% spent face to face counseling and coordinating care.   Consultants:  none  Code Status:  FULL  DVT Prophylaxis:  Sylvia Lovenox   Procedures: As Listed in Progress Note Above  Antibiotics: Azithromycin 12/3>>>12/4 Ceftriaxone 12/3>>>    Subjective: Patient states that his breathing is a little bit better, but he has significant shortness of breath with just going to the bathroom.  He continues to have a nonproductive cough.  Denies any fevers, chills, chest pain, nausea, vomiting, diarrhea, abdominal pain.  No dysuria hematuria.  No rashes.  Objective: Vitals:   05/28/17 0748 05/28/17 1105 05/28/17 1251 05/28/17 1337  BP:    (!) 142/85  Pulse:    94  Resp:    18  Temp:    98 F (36.7 C)  TempSrc:    Oral    SpO2: 100% 96% 94% 100%  Weight:      Height:        Intake/Output Summary (Last 24 hours) at 05/28/2017 1507 Last data filed at 05/28/2017 1200 Gross per 24 hour  Intake 720 ml  Output 300 ml  Net 420 ml   Weight change:  Exam:   General:  Pt is alert, follows commands appropriately, not in acute distress  HEENT: No icterus, No thrush, No neck mass, /AT  Cardiovascular: RRR, S1/S2, no rubs, no gallops  Respiratory: Bilateral expiratory wheeze.  Bibasilar rales.  Abdomen: Soft/+BS, non tender, non distended, no guarding  Extremities: No edema, No lymphangitis, No petechiae, No rashes, no synovitis   Data Reviewed: I have personally reviewed following labs and imaging studies Basic Metabolic Panel: Recent Labs  Lab 05/26/17 0835 05/27/17 0629  NA 135 136  K 4.0 3.6  CL 97* 101  CO2 28 29  GLUCOSE 114* 159*  BUN 7 11  CREATININE 0.84 0.68  CALCIUM 9.0 8.7*   Liver Function Tests: No results for input(s): AST, ALT, ALKPHOS, BILITOT, PROT, ALBUMIN in the last 168 hours. No results for input(s): LIPASE, AMYLASE in the last 168 hours. No results for input(s): AMMONIA in the last 168 hours. Coagulation Profile: No results for input(s): INR, PROTIME in the last 168 hours. CBC: Recent Labs  Lab 05/26/17 1451 05/27/17 0629  WBC 9.9 9.4  HGB 14.8 13.0  HCT 45.2 40.4  MCV 92.6 93.1  PLT 238 210   Cardiac Enzymes: Recent Labs  Lab 05/26/17 0835  TROPONINI <0.03   BNP: Invalid input(s): POCBNP CBG: No results for input(s): GLUCAP in the last 168 hours. HbA1C: No results for input(s): HGBA1C in the last 72 hours. Urine analysis:    Component Value Date/Time   COLORURINE YELLOW 03/02/2010 0852   APPEARANCEUR CLEAR 03/02/2010 0852   LABSPEC 1.005 03/02/2010 0852   PHURINE 6.5 03/02/2010 0852   GLUCOSEU NEGATIVE 03/02/2010 0852   HGBUR SMALL (A) 03/02/2010 0852   BILIRUBINUR NEGATIVE 03/02/2010 0852   KETONESUR NEGATIVE 03/02/2010 0852   PROTEINUR  NEGATIVE 03/02/2010 0852   UROBILINOGEN 0.2 03/02/2010 0852   NITRITE NEGATIVE 03/02/2010 0852   LEUKOCYTESUR TRACE (A) 03/02/2010 0852   Sepsis Labs: @LABRCNTIP (procalcitonin:4,lacticidven:4) ) Recent Results (from the past 240 hour(s))  Culture, blood (routine x 2)     Status: None (Preliminary result)   Collection Time: 05/26/17  9:15 AM  Result Value Ref Range Status   Specimen Description BLOOD RIGHT ARM  Final   Special Requests   Final    BOTTLES DRAWN AEROBIC AND ANAEROBIC Blood Culture adequate volume   Culture NO GROWTH 2 DAYS  Final   Report Status PENDING  Incomplete  Culture, blood (routine x 2)     Status: None (Preliminary result)   Collection Time: 05/26/17  9:25 AM  Result Value Ref Range Status   Specimen Description BLOOD RIGHT ARM  Final   Special Requests   Final    BOTTLES DRAWN AEROBIC AND ANAEROBIC Blood Culture adequate volume   Culture  Setup Time   Final    NO ORGANISMS SEEN AEROBIC BOTTLE PLATED AND RELOADED ONTO INSTRUMENT   Culture NO GROWTH 2 DAYS  Final   Report Status PENDING  Incomplete  Culture, sputum-assessment     Status: None   Collection Time: 05/26/17  2:26 PM  Result Value Ref Range Status   Specimen Description EXPECTORATED SPUTUM  Final   Special Requests NONE  Final   Sputum evaluation   Final    THIS SPECIMEN IS ACCEPTABLE FOR SPUTUM CULTURE Performed at [facility]    Report Status 05/26/2017 FINAL  Final  Culture, respiratory (NON-Expectorated)     Status: None (Preliminary result)   Collection Time: 05/26/17  2:26 PM  Result Value Ref Range Status   Specimen Description EXPECTORATED SPUTUM  Final   Special Requests NONE Reflexed from [id number]  Final   Gram Stain   Final    FEW WBC PRESENT, PREDOMINANTLY PMN FEW GRAM POSITIVE COCCI IN PAIRS IN CHAINS RARE GRAM POSITIVE RODS RARE GRAM NEGATIVE RODS    Culture   Final    CULTURE REINCUBATED FOR BETTER GROWTH Performed at [facility], [address],  [place], [place] [address]    Report Status PENDING  Incomplete     Scheduled Meds: . aspirin EC  81 mg Oral Daily  . enoxaparin (LOVENOX) injection  40 mg Subcutaneous Q24H  . ipratropium-albuterol  3 mL Nebulization Q4H  . methylPREDNISolone (SOLU-MEDROL) injection  60 mg Intravenous Q6H  . mometasone-formoterol  2 puff Inhalation BID   Continuous Infusions: . sodium chloride 75 mL/hr at 05/28/17 0738  . cefTRIAXone (ROCEPHIN)  IV Stopped (05/28/17 9811)    Procedures/Studies: Dg Chest Portable 1 View  Result Date: 05/26/2017 CLINICAL DATA:  Productive cough and shortness of breath since yesterday. History of asthma -COPD, current smoker. EXAM: PORTABLE CHEST 1 VIEW COMPARISON:  Chest x-ray of May 20, 2016 FINDINGS: The lungs are hyperinflated with hemidiaphragm flattening. There is subtle increased density  in the left lung in the upper, mid, and lower perihilar regions. There is no pleural effusion or pneumothorax. The heart and pulmonary vascularity are normal. IMPRESSION: Probable subsegmental atelectasis or early interstitial pneumonia on the left superimposed upon significant emphysematous changes. No overt CHF. Followup PA and lateral chest X-ray is recommended in 3-4 weeks following trial of antibiotic therapy to ensure resolution and exclude underlying malignancy. Electronically Signed   By: Shara Hartis  [place] M.D.   On: 05/26/2017 08:36    Catarina Hartshornavid Jamiah Recore, DO  [facility] Pager [phone]  If 7PM-7AM, please contact night-coverage [email or web address] Password [email or web address] 05/28/2017, 3:07 PM   LOS: 2 days

## 2017-05-28 NOTE — Progress Notes (Signed)
Patient stated to respiratory staff that he drinks beer daily.  Patient told nursing staff when asked that he consumes approximately six beers daily Patient states that right now he feels fine is having no hallucinations or tremors,  But per nursing staff patient has been anxious at times during the day. Mid level notified and ciwa protocol initiated.

## 2017-05-28 NOTE — Care Management Note (Signed)
Case Management Note  Patient Details  Name: Tony [name] MRN: [id number] Date of Birth: 06/02/1961  Subjective/Objective:  Adm with acute on chronic respiratory failure with hypoxia. He lives with a younger brother and reports that all his family lives close together. He has cane, but doesn't use it. Has neb machine at home. Acutely on oxygen.                Action/Plan: Anticipate DC home. Will follow for oxygen needs.   Expected Discharge Date:     05/30/2017             Expected Discharge Plan:     In-House Referral:     Discharge planning Services  CM Consult  Post Acute Care Choice:    Choice offered to:     DME Arranged:    DME Agency:     HH Arranged:    HH Agency:     Status of Service:  In process, will continue to follow  If discussed at Long Length of Stay Meetings, dates discussed:    Additional Comments:  Jaeliana Lococo, Chrystine OilerSharley Diane, RN 05/28/2017, 11:06 AM

## 2017-05-29 DIAGNOSIS — F17210 Nicotine dependence, cigarettes, uncomplicated: Secondary | ICD-10-CM

## 2017-05-29 DIAGNOSIS — F1020 Alcohol dependence, uncomplicated: Secondary | ICD-10-CM

## 2017-05-29 DIAGNOSIS — J9600 Acute respiratory failure, unspecified whether with hypoxia or hypercapnia: Secondary | ICD-10-CM

## 2017-05-29 LAB — CULTURE, RESPIRATORY W GRAM STAIN: Culture: NORMAL

## 2017-05-29 NOTE — Progress Notes (Signed)
Walked in hallway this afternoon on 3 liters.  Felt short of breath on returning to bed. Placed back on 3 liters.

## 2017-05-29 NOTE — Progress Notes (Signed)
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  He complains of worsen nonproductive cough without fever, chills, hemoptysis.  Initial CXR showed atelectasis vs early interstitial pneumonia.  The patient was started on IV solumedrol, Duonebs and antibiotics.  Assessment/Plan: Acute respiratory failure with hypoxia -secondary to COPD exacerbation -presently stable on 3L [place] -very little functional reserve--dyspneic with minimal exertion  COPD Exacerbation -continue DuoNebs q 4 hours -d/c spiriva -continue solu-medrol -continue pulmicort -continue ceftriaxone for now -personally reviewed CXR--no consolidation -do not feel pt has pneumonia--urine strep pneumoniae antigen nonspecific -slow improvement, a little better today  Tobacco abuse -cessation discussed -nicotine patch  Poor Health Literacy -pt cannot read or write  Alcohol dependence -drinks up to 6 beers daily -CIWA -no signs of withdrawal presently   Disposition Plan:   Home in 1-2 days  Family Communication:  No Family at bedside  Consultants:  none  Code Status:  FULL  DVT Prophylaxis:  [place] Lovenox   Procedures: As Listed in Progress Note Above  Antibiotics: Azithromycin 12/3>>>12/4 Ceftriaxone 12/3>>>       Subjective: Patient is breathing a little bit better today.  He was able to walk further without shortness of breath.  He continues to have a nonproductive cough.  Denies any fevers, chills, chest pain, nausea, vomiting, diarrhea, abdominal pain.  No Dysuria hematuria.  Objective: Vitals:   05/29/17 0900 05/29/17 1146 05/29/17 1500 05/29/17 1550  BP: 134/86  131/86   Pulse: 85  84   Resp:   18   Temp: 97.6 F (36.4 C)  98 F (36.7 C)    TempSrc: Oral  Oral   SpO2: 100% 99% 98% 97%  Weight:      Height:        Intake/Output Summary (Last 24 hours) at 05/29/2017 1706 Last data filed at 05/29/2017 1200 Gross per 24 hour  Intake 2610 ml  Output 700 ml  Net 1910 ml   Weight change:  Exam:   General:  Pt is alert, follows commands appropriately, not in acute distress  HEENT: No icterus, No thrush, No neck mass, [place]/AT  Cardiovascular: RRR, S1/S2, no rubs, no gallops  Respiratory: Diminished breath sounds at the bases.   bibasilar rales.  Bibasilar wheezing.  Abdomen: Soft/+BS, non tender, non distended, no guarding  Extremities: No edema, No lymphangitis, No petechiae, No rashes, no synovitis   Data Reviewed: I have personally reviewed following labs and imaging studies Basic Metabolic Panel: Recent Labs  Lab 05/26/17 0835 05/27/17 0629  NA 135 136  K 4.0 3.6  CL 97* 101  CO2 28 29  GLUCOSE 114* 159*  BUN 7 11  CREATININE 0.84 0.68  CALCIUM 9.0 8.7*   Liver Function Tests: No results for input(s): AST, ALT, ALKPHOS, BILITOT, PROT, ALBUMIN in the last 168 hours. No results for input(s): LIPASE, AMYLASE in the last 168 hours. No results for input(s): AMMONIA in the last 168 hours. Coagulation Profile: No results for input(s): INR, PROTIME in the last 168 hours. CBC: Recent Labs  Lab 05/26/17 1451 05/27/17 0629  WBC 9.9 9.4  HGB 14.8 13.0  HCT 45.2 40.4  MCV 92.6 93.1  PLT 238 210   Cardiac Enzymes: Recent Labs  Lab 05/26/17 0835  TROPONINI <0.03   BNP: Invalid input(s): POCBNP CBG: No results for input(s): GLUCAP in the last 168 hours. HbA1C: No results for input(s): HGBA1C in the last 72 hours. Urine analysis:    Component Value Date/Time   COLORURINE YELLOW 03/02/2010 0852   APPEARANCEUR CLEAR 03/02/2010 0852   LABSPEC 1.005 03/02/2010 0852   PHURINE 6.5 03/02/2010 0852   GLUCOSEU NEGATIVE 03/02/2010 0852   HGBUR SMALL (A) 03/02/2010 0852   BILIRUBINUR NEGATIVE 03/02/2010  0852   KETONESUR NEGATIVE 03/02/2010 0852   PROTEINUR NEGATIVE 03/02/2010 0852   UROBILINOGEN 0.2 03/02/2010 0852   NITRITE NEGATIVE 03/02/2010 0852   LEUKOCYTESUR TRACE (A) 03/02/2010 0852   Sepsis Labs: @LABRCNTIP (procalcitonin:4,lacticidven:4) ) Recent Results (from the past 240 hour(s))  Culture, blood (routine x 2)     Status: None (Preliminary result)   Collection Time: 05/26/17  9:15 AM  Result Value Ref Range Status   Specimen Description BLOOD RIGHT ARM  Final   Special Requests   Final    BOTTLES DRAWN AEROBIC AND ANAEROBIC Blood Culture adequate volume   Culture NO GROWTH 3 DAYS  Final   Report Status PENDING  Incomplete  Culture, blood (routine x 2)     Status: None (Preliminary result)   Collection Time: 05/26/17  9:25 AM  Result Value Ref Range Status   Specimen Description BLOOD RIGHT ARM  Final   Special Requests   Final    BOTTLES DRAWN AEROBIC AND ANAEROBIC Blood Culture adequate volume   Culture  Setup Time   Final    NO ORGANISMS SEEN AEROBIC BOTTLE PLATED AND RELOADED ONTO INSTRUMENT   Culture NO GROWTH 3 DAYS  Final   Report Status PENDING  Incomplete  Culture, sputum-assessment     Status: None   Collection Time: 05/26/17  2:26 PM  Result Value Ref Range Status   Specimen Description EXPECTORATED SPUTUM  Final   Special Requests NONE  Final   Sputum evaluation   Final    THIS SPECIMEN IS ACCEPTABLE FOR SPUTUM CULTURE Performed at [facility]    Report Status 05/26/2017 FINAL  Final  Culture, respiratory (NON-Expectorated)     Status: None   Collection Time: 05/26/17  2:26 PM  Result Value Ref Range Status   Specimen Description EXPECTORATED SPUTUM  Final   Special Requests NONE Reflexed from [id number]  Final   Gram Stain   Final    FEW WBC PRESENT, PREDOMINANTLY PMN FEW GRAM POSITIVE COCCI IN PAIRS IN CHAINS RARE GRAM POSITIVE RODS RARE GRAM NEGATIVE RODS    Culture   Final    Consistent with normal respiratory flora. Performed at [facility], [address] [place], [place] [address]    Report Status 05/29/2017 FINAL  Final     Scheduled Meds: . aspirin EC  81 mg Oral Daily  . budesonide (PULMICORT) nebulizer solution  0.5 mg Nebulization BID  . enoxaparin (LOVENOX) injection  40 mg Subcutaneous Q24H  . folic acid  1 mg Oral Daily  . ipratropium-albuterol  3 mL Nebulization Q4H  . methylPREDNISolone (SOLU-MEDROL) injection  60 mg Intravenous Q6H  . multivitamin with minerals  1 tablet Oral Daily  . thiamine  100 mg Oral Daily   Or  . thiamine  100 mg Intravenous Daily   Continuous Infusions: . sodium chloride 75 mL/hr at 05/29/17 1040  . cefTRIAXone (ROCEPHIN)  IV Stopped (05/29/17 1123)    Procedures/Studies: Dg Chest Portable 1 View  Result Date:  05/26/2017 CLINICAL DATA:  Productive cough and shortness of breath since yesterday. History of asthma -COPD, current smoker. EXAM: PORTABLE CHEST 1 VIEW COMPARISON:  Chest x-ray of May 20, 2016 FINDINGS: The lungs are hyperinflated with hemidiaphragm flattening. There is subtle increased density in the left lung in the upper, mid, and lower perihilar regions. There is no pleural effusion or pneumothorax. The heart and pulmonary vascularity are normal. IMPRESSION: Probable subsegmental atelectasis or early interstitial pneumonia on the left superimposed upon significant emphysematous changes. No overt CHF. Followup PA and lateral chest X-ray is recommended in 3-4 weeks following trial of antibiotic therapy to ensure resolution and exclude underlying malignancy. Electronically Signed   By: Alila Sotero  [place] M.D.   On: 05/26/2017 08:36    Catarina Hartshorn, DO  [facility] Pager [phone]  If 7PM-7AM, please contact night-coverage [email or web address] Password [email or web address] 05/29/2017, 5:06 PM   LOS: 3 days

## 2017-05-30 NOTE — Progress Notes (Addendum)
SATURATION QUALIFICATIONS: (This note is used to comply with regulatory documentation for home oxygen)  Patient Saturations on Room Air at Rest = 93%  Patient Saturations on Room Air while Ambulating = 86%  Patient Saturations on 2 Liters of oxygen while Ambulating = 95%  Please briefly explain why patient needs home oxygen: To maintain 02 sat at 90% or above during ambulation.  DTat

## 2017-05-30 NOTE — Care Management Note (Signed)
Case Management Note  Patient Details  Name: Tony [name] MRN: [id number] Date of Birth: 1961/06/13  Expected Discharge Date:                  Expected Discharge Plan:  Home/Self Care  In-House Referral:  NA  Discharge planning Services  CM Consult  Post Acute Care Choice:  Durable Medical Equipment Choice offered to:  Patient  DME Arranged:  Oxygen DME Agency:  [facility]  Status of Service:  Completed, signed off  Additional Comments: Pt will DC home over weekend. Will need supplemental oxygen . Per RN pt requests I contact his sister, Talbert ForestShirley for DC planning. CM called shirley on cell phone and she prefers to to use [facility] for DME. CM has sent referral and verified receipt of referral with Lurena JoinerRebecca at [facility]. They will deliver port tank to pt room prior to DC.   Malcolm Metrohildress, Pranshu Lyster Demske, RN 05/30/2017, 12:20 PM

## 2017-05-30 NOTE — Care Management (Signed)
Patient Information   Patient Name Tony [name], Tony [name] ([id number]) Sex Male DOB 1960/11/28  Room Bed  A313 A313-01  Patient Demographics   Address [address] [place] [address] Phone [phone] [facility](Home) [phone] (Mobile)  Patient Ethnicity & Race   Ethnic Group Patient Race  Not Hispanic or Latino White or Caucasian  Emergency Contact(s)   Name Relation Home Work [place]  [place],Joy Other   [phone]  Carney LivingMitchell,Shirley Sister [phone]  [phone]  Documents on File    Status Date Received Description  Documents for the Patient  EMR Medication Summary Not Received    EMR Problem Summary Not Received    EMR Patient Summary Not Received    [place] HIPAA NOTICE OF PRIVACY - Scanned Received 02/01/11   [place] E-Signature HIPAA Notice of Privacy Received 10/18/11   [place] E-Signature HIPAA Notice of Privacy Spanish Not Received    Driver's License Not Received    Insurance Card Not Received    Advance Directives/Living Will/HCPOA/POA Not Received    Financial Application Not Received    [place] HIPAA NOTICE OF PRIVACY - Scanned Not Received    HIM ROI Authorization Not Received    Release of Information Not Received    HIM ROI Authorization Not Received    Release of Information Not Received    Release of Information Not Received    Release of Information Not Received    HIM ROI Authorization Not Received    Release of Information Not Received    Driver's License Not Received    Insurance Card Not Received    AMB Correspondence Not Received  02/14 Referral [facility]  HIM ROI Authorization Not Received  09/17/12 - ER/inpatient stay; 07/2012 to present; all notes, labs, xrays, tests.   Release of Information Not Received    [facility] Policy for Pain - E Signature Not Received    Insurance Card Received 10/29/12 [facility]- MCD  Insurance Card Not Received    AMB Correspondence  11/30/12 04/14 Referral [facility] PA-C, R  Other Photo ID Not Received     HIM ROI Authorization  05/20/16   Insurance Card   Medicaid 2017 LBPU  Release of Information Received 05/21/16 DPR [email or web address]  Patient Photo   Photo of Patient  Documents for the Encounter  AOB (Assignment of Insurance Benefits) Not Received    E-signature AOB Signed 05/26/17   MEDICARE RIGHTS Not Received    E-signature Medicare Rights     Cardiac Monitoring Strip Shift Summary Received 05/26/17   ED Patient Billing Extract   ED PB Billing Extract  Cardiac Monitoring Strip Received 05/26/17   EKG Received 05/27/17   Admission Information   Attending Provider Admitting Provider Admission Type Admission Date/Time  Tat, Onalee Huaavid, MD Philip AspenHernandez Acosta, Limmie PatriciaEstela Y, MD Emergency 05/26/17 [phone]  Discharge Date Hospital Service Auth/Cert Status Service Area   Internal Medicine Incomplete  SERVICE AREA  Unit Room/Bed Admission Status   AP-DEPT 300 A313/A313-01 Admission (Confirmed)   Admission   Complaint  sob  Hospital Account   Name Acct ID Class Status Primary Coverage  Tony [name], Tony [name] [id number] Inpatient Open MEDICAID [place] - MEDICAID [place] ACCESS      Guarantor Account (for Hospital Account [id number])   Name Relation to Pt Service Area Active? Acct Type  Tony [name], Tony [name] Self CHSA Yes Personal/Family  Address Phone    [address] [place], [place] [address] [phone](H)        Coverage Information (for Hospital Account [id number])  F/O Payor/Plan Precert #  MEDICAID [place]/MEDICAID [place] ACCESS   Subscriber Subscriber #  Tony [name], Tony [name] [id number] O  Address Phone  [address] [address] [place], [place] [address] [phone]

## 2017-05-30 NOTE — Progress Notes (Signed)
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 He complains of worsen nonproductive cough without fever, chills, hemoptysis. Initial CXR showed atelectasis vs early interstitial pneumonia. The patient was started on IV solumedrol, Duonebs and antibiotics.  Assessment/Plan: Acute respiratory failure with hypoxia -secondary to COPD exacerbation -presently stable on 2L [place] -very little functional reserve--dyspneic with minimal exertion -ambulatory pulse ox showed desaturation on RA -set up home oxygen  COPD Exacerbation -continue DuoNebs q 4 hours -d/c spiriva -continue solu-medrol -continue pulmicort -continue ceftriaxone for now -personally reviewed CXR--no consolidation -do not feel pt has pneumonia--urine strep pneumoniae antigen nonspecific -slow improvement each day  Tobacco abuse -cessation discussed -nicotine patch  Poor Health Literacy -pt cannot read or write  Alcohol dependence -drinks up to 6 beers daily -CIWA   Disposition Plan: Home 12/8 if stable Family Communication: Sister updated at bedside  Consultants:none  Code Status: FULL  DVT Prophylaxis: [place] Lovenox   Procedures: As Listed in Progress Note Above  Antibiotics: Azithromycin 12/3>>>12/4 Ceftriaxone 12/3>>>      Subjective: Patient continued to breathe better.  He still has shortness of breath when walking in the hallway.  He has a nonproductive cough but it is improving.  He denies any fevers, chills, chest pain, nausea, vomiting, diarrhea, abdominal pain, dysuria, hematuria.  No rashes.  Objective: Vitals:   05/30/17 0023 05/30/17 0443 05/30/17 0748 05/30/17 0754  BP:  (!) 133/91    Pulse:  92    Resp:      Temp:   (!) 97.5 F (36.4 C)    TempSrc:  Oral    SpO2: 96% 95% 98% 100%  Weight:      Height:        Intake/Output Summary (Last 24 hours) at 05/30/2017 1038 Last data filed at 05/30/2017 1000 Gross per 24 hour  Intake 770 ml  Output 800 ml  Net -30 ml   Weight change:  Exam:   General:  Pt is alert, follows commands appropriately, not in acute distress  HEENT: No icterus, No thrush, No neck mass, [place]/AT  Cardiovascular: RRR, S1/S2, no rubs, no gallops  Respiratory: Bibasilar rales with diminished breath sounds.  Small amount of bibasilar wheeze.  Abdomen: Soft/+BS, non tender, non distended, no guarding  Extremities: No edema, No lymphangitis, No petechiae, No rashes, no synovitis   Data Reviewed: I have personally reviewed following labs and imaging studies Basic Metabolic Panel: Recent Labs  Lab 05/26/17 0835 05/27/17 0629  NA 135 136  K 4.0 3.6  CL 97* 101  CO2 28 29  GLUCOSE 114* 159*  BUN 7 11  CREATININE 0.84 0.68  CALCIUM 9.0 8.7*   Liver Function Tests: No results for input(s): AST, ALT, ALKPHOS, BILITOT, PROT, ALBUMIN in the last 168 hours. No results for input(s): LIPASE, AMYLASE in the last 168 hours. No results for input(s): AMMONIA in the last 168 hours. Coagulation Profile: No results for input(s): INR, PROTIME in the last 168 hours. CBC: Recent Labs  Lab 05/26/17 1451 05/27/17 0629  WBC 9.9 9.4  HGB 14.8 13.0  HCT 45.2 40.4  MCV 92.6 93.1  PLT 238 210   Cardiac Enzymes: Recent Labs  Lab 05/26/17 0835  TROPONINI <0.03   BNP:  Invalid input(s): POCBNP CBG: No results for input(s): GLUCAP in the last 168 hours. HbA1C: No results for input(s): HGBA1C in the last 72 hours. Urine analysis:    Component Value Date/Time   COLORURINE YELLOW 03/02/2010 0852   APPEARANCEUR CLEAR 03/02/2010 0852   LABSPEC 1.005 03/02/2010 0852   PHURINE 6.5 03/02/2010 0852   GLUCOSEU NEGATIVE 03/02/2010 0852   HGBUR SMALL (A) 03/02/2010 0852   BILIRUBINUR  NEGATIVE 03/02/2010 0852   KETONESUR NEGATIVE 03/02/2010 0852   PROTEINUR NEGATIVE 03/02/2010 0852   UROBILINOGEN 0.2 03/02/2010 0852   NITRITE NEGATIVE 03/02/2010 0852   LEUKOCYTESUR TRACE (A) 03/02/2010 0852   Sepsis Labs: @LABRCNTIP (procalcitonin:4,lacticidven:4) ) Recent Results (from the past 240 hour(s))  Culture, blood (routine x 2)     Status: None (Preliminary result)   Collection Time: 05/26/17  9:15 AM  Result Value Ref Range Status   Specimen Description BLOOD RIGHT ARM  Final   Special Requests   Final    BOTTLES DRAWN AEROBIC AND ANAEROBIC Blood Culture adequate volume   Culture NO GROWTH 4 DAYS  Final   Report Status PENDING  Incomplete  Culture, blood (routine x 2)     Status: None (Preliminary result)   Collection Time: 05/26/17  9:25 AM  Result Value Ref Range Status   Specimen Description BLOOD RIGHT ARM  Final   Special Requests   Final    BOTTLES DRAWN AEROBIC AND ANAEROBIC Blood Culture adequate volume   Culture  Setup Time   Final    NO ORGANISMS SEEN AEROBIC BOTTLE PLATED AND RELOADED ONTO INSTRUMENT   Culture NO GROWTH 4 DAYS  Final   Report Status PENDING  Incomplete  Culture, sputum-assessment     Status: None   Collection Time: 05/26/17  2:26 PM  Result Value Ref Range Status   Specimen Description EXPECTORATED SPUTUM  Final   Special Requests NONE  Final   Sputum evaluation   Final    THIS SPECIMEN IS ACCEPTABLE FOR SPUTUM CULTURE Performed at [facility]    Report Status 05/26/2017 FINAL  Final  Culture, respiratory (NON-Expectorated)     Status: None   Collection Time: 05/26/17  2:26 PM  Result Value Ref Range Status   Specimen Description EXPECTORATED SPUTUM  Final   Special Requests NONE Reflexed from [id number]  Final   Gram Stain   Final    FEW WBC PRESENT, PREDOMINANTLY PMN FEW GRAM POSITIVE COCCI IN PAIRS IN CHAINS RARE GRAM POSITIVE RODS RARE GRAM NEGATIVE RODS    Culture   Final    Consistent with normal respiratory  flora. Performed at [facility], [address] [place], [place] [address]    Report Status 05/29/2017 FINAL  Final     Scheduled Meds: . aspirin EC  81 mg Oral Daily  . budesonide (PULMICORT) nebulizer solution  0.5 mg Nebulization BID  . enoxaparin (LOVENOX) injection  40 mg Subcutaneous Q24H  . folic acid  1 mg Oral Daily  . ipratropium-albuterol  3 mL Nebulization Q4H  . methylPREDNISolone (SOLU-MEDROL) injection  60 mg Intravenous Q6H  . multivitamin with minerals  1 tablet Oral Daily  . thiamine  100 mg Oral Daily   Or  . thiamine  100 mg Intravenous Daily   Continuous Infusions: . cefTRIAXone (ROCEPHIN)  IV Stopped (05/29/17 1123)    Procedures/Studies: Dg Chest Portable 1 View  Result Date: 05/26/2017 CLINICAL DATA:  Productive cough and shortness of breath since yesterday. History of asthma -COPD, current smoker.  EXAM: PORTABLE CHEST 1 VIEW COMPARISON:  Chest x-ray of May 20, 2016 FINDINGS: The lungs are hyperinflated with hemidiaphragm flattening. There is subtle increased density in the left lung in the upper, mid, and lower perihilar regions. There is no pleural effusion or pneumothorax. The heart and pulmonary vascularity are normal. IMPRESSION: Probable subsegmental atelectasis or early interstitial pneumonia on the left superimposed upon significant emphysematous changes. No overt CHF. Followup PA and lateral chest X-ray is recommended in 3-4 weeks following trial of antibiotic therapy to ensure resolution and exclude underlying malignancy. Electronically Signed   By: Hamish Banks  [place] M.D.   On: 05/26/2017 08:36    Catarina Hartshorn, DO  [facility] Pager [phone]  If 7PM-7AM, please contact night-coverage [email or web address] Password [email or web address] 05/30/2017, 10:38 AM   LOS: 4 days

## 2017-05-31 ENCOUNTER — Other Ambulatory Visit: Payer: Self-pay | Admitting: Internal Medicine

## 2017-05-31 LAB — CULTURE, BLOOD (ROUTINE X 2)
CULTURE: NO GROWTH
Culture: NO GROWTH
Special Requests: ADEQUATE
Special Requests: ADEQUATE

## 2017-05-31 MED ORDER — PREDNISONE 10 MG PO TABS: 60.0000 mg | ORAL_TABLET | Freq: Every day | ORAL | 0 refills | Status: DC

## 2017-05-31 MED ORDER — LEVOFLOXACIN 500 MG PO TABS: 500.0000 mg | ORAL_TABLET | Freq: Every day | ORAL | 0 refills | Status: AC

## 2017-05-31 MED ORDER — IPRATROPIUM-ALBUTEROL 0.5-2.5 (3) MG/3ML IN SOLN: 3.0000 mL | RESPIRATORY_TRACT | 0 refills | Status: DC | PRN

## 2017-05-31 NOTE — Discharge Summary (Signed)
Physician Discharge Summary  Astrid DraftsFrank A Dell JYN:[id number] DOB: September 28, 1960 DOA: 05/26/2017  PCP: Selinda FlavinHoward, Kevin, MD  Admit date: 05/26/2017 Discharge date: 05/31/2017  Admitted From: Home Disposition:  Home  Recommendations for Outpatient Follow-up:  1. Follow up with PCP in 1-2 weeks 2. 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 He complains of worsen nonproductive cough without fever, chills, hemoptysis. Initial CXR showed atelectasis vs early interstitial pneumonia. The patient was started on IV solumedrol, Duonebs and antibiotics.    Discharge Diagnoses:  Acute respiratory failure with hypoxia -secondary to COPD exacerbation -presently stable on 2L [place] -very little functional reserve--dyspneic with minimal exertion -ambulatory pulse ox showed desaturation on RA -set up home oxygen--2L  COPD Exacerbation -continueDuoNebs q 4 hours during the hospitalzation -d/c spiriva -continue solu-medrol>>>prednisone taper over 2 weeks after d/c -continuepulmicort -continue ceftriaxone>>home with levofloxacin 2 days -personally reviewed CXR--no consolidation -do not feel pt has pneumonia--urine strep pneumoniae antigen nonspecific -slow improvement each day  Tobacco abuse -cessation discussed -nicotine patch  Poor Health Literacy -pt cannot read or write  Alcohol dependence -drinks up to 6 beers daily -CIWA -no signs of withdrawal     Discharge Instructions  Discharge Instructions    Diet - low sodium heart healthy   Complete by:  As directed    Increase activity slowly   Complete by:  As directed       Allergies as of 05/31/2017      Reactions   Bee Venom Anaphylaxis      Medication List    TAKE these medications   albuterol 108 (90 Base) MCG/ACT inhaler Commonly known as:  PROVENTIL HFA;VENTOLIN HFA Inhale 2 puffs into the lungs every 6 (six) hours as needed. Shortness Of Breath What changed:  Another medication with the same name was removed. Continue taking this medication, and follow the directions you see here.   aspirin EC 81 MG tablet Take 81 mg by mouth daily.   budesonide-formoterol 160-4.5 MCG/ACT inhaler Commonly known as:  SYMBICORT Inhale 2 puffs into the lungs 2 (two) times daily.   EPINEPHrine 0.3 mg/0.3 mL Soaj injection Commonly known as:  EPI-PEN Inject 0.3 mg into the muscle once.   ipratropium-albuterol 0.5-2.5 (3) MG/3ML Soln Commonly known as:  DUONEB Take 3 mLs by nebulization every 4 (four) hours as needed (shortness of breath, wheezing).   levofloxacin 500 MG tablet Commonly known as:  LEVAQUIN Take 1 tablet (500 mg total) by mouth daily for 2 days.   predniSONE 10 MG tablet Commonly known as:  DELTASONE Take 6 tablets (60 mg total) by mouth daily. And decrease by 1 one tablet every 2 days   Tiotropium Bromide Monohydrate 2.5 MCG/ACT Aers Commonly known as:  SPIRIVA RESPIMAT Inhale 2 puffs into the lungs daily.            Durable Medical Equipment  (From admission, onward)        Start     Ordered   05/30/17 1040  For home use only DME oxygen  Once    Question Answer Comment  Mode or (Route) Nasal cannula   Liters per Minute 2   Frequency Continuous (stationary and portable  oxygen unit needed)   Oxygen conserving device Yes   Oxygen delivery system Gas      05/30/17 1039      Allergies  Allergen Reactions  . Bee Venom Anaphylaxis    Consultations:  none   Procedures/Studies: Dg Chest Portable 1 View  Result Date: 05/26/2017 CLINICAL DATA:  Productive cough and shortness of breath since yesterday. History of  asthma -COPD, current smoker. EXAM: PORTABLE CHEST 1 VIEW COMPARISON:  Chest x-ray of May 20, 2016 FINDINGS: The lungs are hyperinflated with hemidiaphragm flattening. There is subtle increased density in the left lung in the upper, mid, and lower perihilar regions. There is no pleural effusion or pneumothorax. The heart and pulmonary vascularity are normal. IMPRESSION: Probable subsegmental atelectasis or early interstitial pneumonia on the left superimposed upon significant emphysematous changes. No overt CHF. Followup PA and lateral chest X-ray is recommended in 3-4 weeks following trial of antibiotic therapy to ensure resolution and exclude underlying malignancy. Electronically Signed   By: Randilyn Foisy  [place] M.D.   On: 05/26/2017 08:36        Discharge Exam: Vitals:   05/31/17 0616 05/31/17 0750  BP: (!) 170/94   Pulse: 65   Resp: 16   Temp: (!) 97.4 F (36.3 C)   SpO2: 99% 98%   Vitals:   05/30/17 2203 05/30/17 2320 05/31/17 0616 05/31/17 0750  BP: 130/86  (!) 170/94   Pulse: 79  65   Resp: 16  16   Temp: (!) 97 F (36.1 C)  (!) 97.4 F (36.3 C)   TempSrc: Oral  Oral   SpO2: 98% 98% 99% 98%  Weight:      Height:        General: Pt is alert, awake, not in acute distress Cardiovascular: RRR, S1/S2 +, no rubs, no gallops Respiratory: Bibasilar rales.  Diminished breath sounds bilateral.  No wheezing. Abdominal: Soft, NT, ND, bowel sounds + Extremities: no edema, no cyanosis   The results of significant diagnostics from this hospitalization (including imaging, microbiology, ancillary and laboratory) are listed below for reference.    Significant Diagnostic Studies: Dg Chest Portable 1 View  Result Date: 05/26/2017 CLINICAL DATA:  Productive cough and shortness of breath since yesterday. History of asthma -COPD, current smoker. EXAM: PORTABLE CHEST 1 VIEW COMPARISON:  Chest x-ray of May 20, 2016 FINDINGS: The lungs are hyperinflated with hemidiaphragm flattening.  There is subtle increased density in the left lung in the upper, mid, and lower perihilar regions. There is no pleural effusion or pneumothorax. The heart and pulmonary vascularity are normal. IMPRESSION: Probable subsegmental atelectasis or early interstitial pneumonia on the left superimposed upon significant emphysematous changes. No overt CHF. Followup PA and lateral chest X-ray is recommended in 3-4 weeks following trial of antibiotic therapy to ensure resolution and exclude underlying malignancy. Electronically Signed   By: Shekira Drummer  [place] M.D.   On: 05/26/2017 08:36     Microbiology: Recent Results (from the past 240 hour(s))  Culture, blood (routine x 2)     Status: None   Collection Time: 05/26/17  9:15 AM  Result Value Ref Range Status   Specimen Description BLOOD RIGHT ARM  Final   Special Requests   Final    BOTTLES DRAWN AEROBIC AND ANAEROBIC Blood Culture adequate volume   Culture NO GROWTH 5 DAYS  Final   Report Status 05/31/2017 FINAL  Final  Culture, blood (routine x 2)     Status: None   Collection Time: 05/26/17  9:25 AM  Result Value Ref Range Status   Specimen Description BLOOD RIGHT ARM  Final   Special Requests   Final    BOTTLES DRAWN AEROBIC AND ANAEROBIC Blood Culture adequate volume   Culture  Setup Time   Final    NO ORGANISMS SEEN AEROBIC BOTTLE PLATED AND RELOADED ONTO INSTRUMENT   Culture NO GROWTH 5 DAYS  Final   Report Status 05/31/2017 FINAL  Final  Culture, sputum-assessment     Status: None   Collection Time: 05/26/17  2:26 PM  Result Value Ref Range Status   Specimen Description EXPECTORATED SPUTUM  Final   Special Requests NONE  Final   Sputum evaluation   Final    THIS SPECIMEN IS ACCEPTABLE FOR SPUTUM CULTURE Performed at [facility]    Report Status 05/26/2017 FINAL  Final  Culture, respiratory (NON-Expectorated)     Status: None   Collection Time: 05/26/17  2:26 PM  Result Value Ref Range Status   Specimen Description EXPECTORATED  SPUTUM  Final   Special Requests NONE Reflexed from [id number]  Final   Gram Stain   Final    FEW WBC PRESENT, PREDOMINANTLY PMN FEW GRAM POSITIVE COCCI IN PAIRS IN CHAINS RARE GRAM POSITIVE RODS RARE GRAM NEGATIVE RODS    Culture   Final    Consistent with normal respiratory flora. Performed at [facility], [address], [place], [place] [address]    Report Status 05/29/2017 FINAL  Final     Labs: Basic Metabolic Panel: Recent Labs  Lab 05/26/17 0835 05/27/17 0629  NA 135 136  K 4.0 3.6  CL 97* 101  CO2 28 29  GLUCOSE 114* 159*  BUN 7 11  CREATININE 0.84 0.68  CALCIUM 9.0 8.7*   Liver Function Tests: No results for input(s): AST, ALT, ALKPHOS, BILITOT, PROT, ALBUMIN in the last 168 hours. No results for input(s): LIPASE, AMYLASE in the last 168 hours. No results for input(s): AMMONIA in the last 168 hours. CBC: Recent Labs  Lab 05/26/17 1451 05/27/17 0629  WBC 9.9 9.4  HGB 14.8 13.0  HCT 45.2 40.4  MCV 92.6 93.1  PLT 238 210   Cardiac Enzymes: Recent Labs  Lab 05/26/17 0835  TROPONINI <0.03   BNP: Invalid input(s): POCBNP CBG: No results for input(s): GLUCAP in the last 168 hours.  Time coordinating discharge:  Greater than 30 minutes  Signed:  Catarina Hartshorn, DO [facility] Pager: [phone] 05/31/2017, 10:44 AM

## 2017-05-31 NOTE — Plan of Care (Signed)
Pt is progressing 

## 2017-05-31 NOTE — Progress Notes (Signed)
Removed IV-clean, dry, and intact. Reviewed d/c paperwork with pt. Reviewed home meds and new prescriptions. Stable pt with belongs walked out with friends.

## 2017-06-06 ENCOUNTER — Ambulatory Visit (INDEPENDENT_AMBULATORY_CARE_PROVIDER_SITE_OTHER): Payer: Medicaid Other | Admitting: Pulmonary Disease

## 2017-06-06 ENCOUNTER — Encounter: Payer: Self-pay | Admitting: Pulmonary Disease

## 2017-06-06 VITALS — BP 130/78 | HR 102 | Ht 67.0 in | Wt 116.8 lb

## 2017-06-06 DIAGNOSIS — J449 Chronic obstructive pulmonary disease, unspecified: Secondary | ICD-10-CM | POA: Diagnosis not present

## 2017-06-06 DIAGNOSIS — F17210 Nicotine dependence, cigarettes, uncomplicated: Secondary | ICD-10-CM | POA: Diagnosis not present

## 2017-06-06 MED ORDER — EPINEPHRINE 0.3 MG/0.3ML IJ SOAJ: 0.3000 mg | Freq: Once | INTRAMUSCULAR | 0 refills | Status: AC

## 2017-06-06 MED ORDER — ALBUTEROL SULFATE HFA 108 (90 BASE) MCG/ACT IN AERS: 2.0000 | INHALATION_SPRAY | Freq: Four times a day (QID) | RESPIRATORY_TRACT | 3 refills | Status: DC | PRN

## 2017-06-06 MED ORDER — BUDESONIDE-FORMOTEROL FUMARATE 160-4.5 MCG/ACT IN AERO: 2.0000 | INHALATION_SPRAY | Freq: Two times a day (BID) | RESPIRATORY_TRACT | 5 refills | Status: DC

## 2017-06-06 MED ORDER — TIOTROPIUM BROMIDE MONOHYDRATE 2.5 MCG/ACT IN AERS: 2.0000 | INHALATION_SPRAY | Freq: Every day | RESPIRATORY_TRACT | 5 refills | Status: DC

## 2017-06-06 NOTE — Patient Instructions (Signed)
I am glad that your breathing is improving after recent hospitalization Continue your inhalers including Spiriva and Symbicort.  Will call in a prescription for this and also your albuterol inhaler We will check your oxygen levels on exertion today We will renew your EpiPen  Follow up in 1 month with chest x-ray.

## 2017-06-06 NOTE — Progress Notes (Signed)
Tony DraftsFrank A Nurse    [id number]    06/18/1961  Primary Care Physician:Howard, Caryn BeeKevin, MD  Referring Physician: Selinda FlavinHoward, Kevin, MD [address] [place], [place] [id number]  Chief complaint: Acute visit for dyspnea, recent hospitalization for COPD exacerbation  HPI: 56 year old with very severe COPD he was hospitalized on 05/26/17 with acute exacerbation of COPD. Chest x-ray shows atelectasis versus early interstitial pneumonia.  Patient was started on IV Solu-Medrol, duo nebs and antibiotics.  Discharged on 05/31/17.  Post discharge he reports slight improvement in his symptoms.  He has finally quit smoking after this admission with no intentions of going back.  He continues on Symbicort and Spiriva.  Symptoms of chronic dyspnea on exertion, cough with white mucus.  Denies any wheezing, fevers, chills.  Outpatient Encounter Medications as of 06/06/2017  Medication Sig  . albuterol (PROVENTIL HFA;VENTOLIN HFA) 108 (90 BASE) MCG/ACT inhaler Inhale 2 puffs into the lungs every 6 (six) hours as needed. Shortness Of Breath  . aspirin EC 81 MG tablet Take 81 mg by mouth daily.  Marland Kitchen. EPINEPHrine 0.3 mg/0.3 mL IJ SOAJ injection Inject 0.3 mg into the muscle once.  Marland Kitchen. ipratropium-albuterol (DUONEB) 0.5-2.5 (3) MG/3ML SOLN Take 3 mLs by nebulization every 4 (four) hours as needed (shortness of breath, wheezing).  . predniSONE (DELTASONE) 10 MG tablet Take 6 tablets (60 mg total) by mouth daily. And decrease by 1 one tablet every 2 days  . SYMBICORT 160-4.5 MCG/ACT inhaler INHALE TWO PUFFS BY MOUTH TWICE DAILY  . Tiotropium Bromide Monohydrate (SPIRIVA RESPIMAT) 2.5 MCG/ACT AERS Inhale 2 puffs into the lungs daily.   No facility-administered encounter medications on file as of 06/06/2017.     Allergies as of 06/06/2017 - Review Complete 06/06/2017  Allergen Reaction Noted  . Bee venom Anaphylaxis 07/31/2012    Past Medical History:  Diagnosis Date  . Asthma   . Bronchiolitis   . COPD (chronic  obstructive pulmonary disease) (HCC)   . Emphysema   . Shortness of breath     Past Surgical History:  Procedure Laterality Date  . COLONOSCOPY N/A 08/17/2012   Procedure: COLONOSCOPY;  Surgeon: Corbin Adeobert M Rourk, MD;  Location: [facility];  Service: Endoscopy;  Laterality: N/A;  9:30 AM  . CYST EXCISION Right    posterior neck- 56 years old    Family History  Problem Relation Age of Onset  . Cancer Mother   . COPD Brother     Social History   Socioeconomic History  . Marital status: Single    Spouse name: Not on file  . Number of children: Not on file  . Years of education: Not on file  . Highest education level: Not on file  Social Needs  . Financial resource strain: Not on file  . Food insecurity - worry: Not on file  . Food insecurity - inability: Not on file  . Transportation needs - medical: Not on file  . Transportation needs - non-medical: Not on file  Occupational History  . Not on file  Tobacco Use  . Smoking status: Former Smoker    Packs/day: 0.50    Years: 40.00    Pack years: 20.00    Types: Cigarettes  . Smokeless tobacco: Never Used  . Tobacco comment: quit smoking 05/26/17  Substance and Sexual Activity  . Alcohol use: No    Alcohol/week: 0.0 oz  . Drug use: No  . Sexual activity: Not on file  Other Topics Concern  .  Not on file  Social History Narrative  . Not on file    Review of systems: Review of Systems  Constitutional: Negative for fever and chills.  HENT: Negative.   Eyes: Negative for blurred vision.  Respiratory: as per HPI  Cardiovascular: Negative for chest pain and palpitations.  Gastrointestinal: Negative for vomiting, diarrhea, blood per rectum. Genitourinary: Negative for dysuria, urgency, frequency and hematuria.  Musculoskeletal: Negative for myalgias, back pain and joint pain.  Skin: Negative for itching and rash.  Neurological: Negative for dizziness, tremors, focal weakness, seizures and loss of consciousness.    Endo/Heme/Allergies: Negative for environmental allergies.  Psychiatric/Behavioral: Negative for depression, suicidal ideas and hallucinations.  All other systems reviewed and are negative.  Physical Exam: Blood pressure 130/78, pulse (!) 102, height 5\' 7"  (1.702 m), weight 116 lb 12.8 oz (53 kg), SpO2 98 %. Gen:      No acute distress, cachectic, frail HEENT:  EOMI, sclera anicteric  Neck:     No masses; no thyromegaly Lungs:    Diminished breath sounds; normal respiratory effort CV:         Regular rate and rhythm; no murmurs Abd:      + bowel sounds; soft, non-tender; no palpable masses, no distension Ext:    No edema; adequate peripheral perfusion Skin:      Warm and dry; no rash Neuro: alert and oriented x 3 Psych: normal mood and affect  Data Reviewed: PFTs 07/08/16 FVC 2.4 [54%], FEV1 1.01 [1 9%], F/F 41, TLC 109%, DLCO 50%. Severe obstruction with severe diffusion defect.  Chest x-ray 05/26/17-hyperinflation, interstitial prominence on the left.  I have reviewed the images personally.  Assessment:  Follow-up for severe COPD with exacerbation He is making slow improvement and will continue on Symbicort and Spiriva He did not desat on exertion today even off oxygen.  However given his recent admission we will continue supplemental oxygen. Reassess at next visit. Congratulated him on quitting smoking finally and encouraged not to relapse. He has history of allergies to bee sting and is requesting refill on his EpiPen.  Follow-up in 1 month with repeat x-ray to ensure infiltrate is clearing.  Plan/Recommendations: - Continue Symbicort Spiriva - Continue supplemental oxygen - Call in prescription for EpiPen.  Follow-up in 1 month with chest x-ray  Chilton GreathousePraveen Neyah Ellerman MD [facility] Pager [phone] 06/06/2017, 3:38 PM  CC: Selinda FlavinHoward, Kevin, MD

## 2017-07-07 ENCOUNTER — Ambulatory Visit: Payer: Medicaid Other | Admitting: Internal Medicine

## 2017-07-11 ENCOUNTER — Encounter: Payer: Self-pay | Admitting: Internal Medicine

## 2017-07-11 ENCOUNTER — Ambulatory Visit (INDEPENDENT_AMBULATORY_CARE_PROVIDER_SITE_OTHER): Payer: Medicaid Other | Admitting: Internal Medicine

## 2017-07-11 ENCOUNTER — Ambulatory Visit (INDEPENDENT_AMBULATORY_CARE_PROVIDER_SITE_OTHER)
Admission: RE | Admit: 2017-07-11 | Discharge: 2017-07-11 | Disposition: A | Discharge status: Home / Self Care | Payer: Medicaid Other | Source: Ambulatory Visit | Attending: Internal Medicine | Admitting: Internal Medicine

## 2017-07-11 VITALS — BP 112/76 | HR 79 | Ht 68.0 in | Wt 119.4 lb

## 2017-07-11 DIAGNOSIS — K089 Disorder of teeth and supporting structures, unspecified: Secondary | ICD-10-CM | POA: Diagnosis not present

## 2017-07-11 DIAGNOSIS — F17210 Nicotine dependence, cigarettes, uncomplicated: Secondary | ICD-10-CM

## 2017-07-11 DIAGNOSIS — J9611 Chronic respiratory failure with hypoxia: Secondary | ICD-10-CM

## 2017-07-11 DIAGNOSIS — J449 Chronic obstructive pulmonary disease, unspecified: Secondary | ICD-10-CM | POA: Diagnosis not present

## 2017-07-11 DIAGNOSIS — J181 Lobar pneumonia, unspecified organism: Secondary | ICD-10-CM

## 2017-07-11 DIAGNOSIS — J189 Pneumonia, unspecified organism: Secondary | ICD-10-CM

## 2017-07-11 DIAGNOSIS — Z23 Encounter for immunization: Secondary | ICD-10-CM

## 2017-07-11 MED ORDER — FLUTTER DEVI: 1.0000 | 0 refills | Status: AC

## 2017-07-11 NOTE — Progress Notes (Signed)
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 Pulmonary Consult    Referred by Kizzie Furnishochelle Muse, NP. Pt c/o SOB since 2014, worse "for a while".   He gets winded just walking short distances such as walking to his back yard. He also c/o left side pain. He also c/o cough- prod with thick, clear to white sputum.    doe x MMRC3 = can't walk 100 yards even at a slow pace at a flat grade s stopping due to sob  Without much fluctuation  Has bilateral L CP  24/7 x 3 years but worse with coughing and when lying on L side  rec Plan A = Automatic = Symbicort 160 Take 2 puffs first thing in am and then another 2 puffs about 12 hours later.  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 He complains of worsen nonproductive cough without fever, chills, hemoptysis. Initial CXR showed atelectasis vs early interstitial pneumonia. The patient was started on IV solumedrol, Duonebs and antibiotics.    Discharge Diagnoses:  Acute respiratory failure with hypoxia -secondary to COPD exacerbation -presently stable on2L [place] -very little  functional reserve--dyspneic with minimal exertion -ambulatory pulse ox showed desaturation on RA -set up home oxygen--2L  COPD Exacerbation -continueDuoNebs q 4 hours during the hospitalzation -d/c spiriva -continue solu-medrol>>>prednisone taper over 2 weeks after d/c -continuepulmicort -continue ceftriaxone>>home with levofloxacin 2 days  -do not feel pt has pneumonia--urine strep pneumoniae antigen nonspecific -slow improvementeach day  Tobacco abuse -cessation discussed -nicotine patch  Poor Health Literacy -pt cannot read or write  Alcohol dependence -drinks up to 6 beers daily -CIWA -no signs of withdrawal     07/11/2017  f/u ov/Theodoro Koval re:  Transition of care s/p aeCopd /newly on 02 2lpm 25/7  poor hfa and still smoking Chief Complaint  Patient presents with  . Follow-up    Pt states he has good days and bad days. On 07/09/17 and 07/10/17, pt states his breathing was a lot worse with the weather change. Breathing has been doing good today. Pt does have occ coughing but is unable to get any mucus up. Denies any CP.   overall about the same [facility] can't walk 100 yards even at a slow pace at a flat grade s stopping due to sob  Even on 02/ has difficulty with chest congestion/ rattling but what mucus he does bring up is mucoid, thick, not purulent worse in am's but able to  sleep ok on 2 pillows  No obvious patterns day to day or daytime variability or assoc   mucus plugs or hemoptysis or cp or chest tightness, subjective wheeze or overt sinus or hb symptoms. No unusual exposure hx or h/o childhood pna/ asthma or knowledge of premature birth.  Sleeping ok ok 2lpm on 2 pillows without nocturnal   exacerbation  of respiratory  c/o's or need for noct saba. Also denies any obvious fluctuation of symptoms with weather or environmental changes or other aggravating or alleviating factors except as outlined above   Current Allergies, Complete Past  Medical History, Past Surgical History, Family History, and Social History were reviewed in [employer] record.  ROS  The following are not active complaints unless bolded Hoarseness, sore throat, dysphagia, dental problems, itching, sneezing,  nasal congestion or discharge of excess mucus or purulent secretions, ear ache,   fever, chills, sweats, unintended wt loss or wt gain, classically pleuritic or exertional cp,  orthopnea pnd or leg swelling, presyncope, palpitations, abdominal pain, anorexia, nausea, vomiting, diarrhea  or change in bowel habits or change in bladder habits, change in stools or change in urine, dysuria, hematuria,  rash, arthralgias, visual complaints, headache, numbness, weakness or ataxia or problems with walking or coordination,  change in mood/affect or memory.        Not really clear what meds he's taking / very confused with names /how when to take /use meds         Objective:   Physical Exam    Thin amb wm nad congested sounding rattling  cough   07/08/2016        116  05/20/16 115 lb 6.4 oz (52.3 kg)  10/29/12 133 lb (60.3 kg)  08/17/12 128 lb (58.1 kg)    Vital signs reviewed  - Note on arrival 02 sats  100% on RA      HEENT: nl   turbinates, and oropharynx. Nl external ear canals without cough reflex - very poor dentition esp L lower molars  LUNGS: no acc muscle use,  Barrel contour chest with distant bs with faint bilateral late exp wheeze    HEENT: nl  turbinates bilaterally, and oropharynx. Nl external ear canals without cough reflex - very poor dentition with one L lower molar in particular looks like oyster    NECK :  without JVD/Nodes/TM/ nl carotid upstrokes bilaterally   LUNGS: no acc muscle use,  Barrel chest with very distant bs/ minimal exp rhonchi bilaterally    CV:  RRR  no s3 or murmur or increase in P2, and no edema   ABD:  soft and nontender with nl inspiratory excursion in the supine position. No  bruits or organomegaly appreciated, bowel sounds nl  MS:  Nl gait/ ext warm without deformities, calf tenderness, cyanosis or clubbing No obvious joint restrictions   SKIN: warm and dry without lesions    NEURO:  alert, approp, nl sensorium with  no motor or cerebellar deficits apparent.     CXR PA and Lateral:   07/11/2017 :    I personally reviewed images and agree with radiology impression as follows:    Hyperinflation with emphysematous disease. Bandlike opacity posteriorly on the lateral view, could reflect atelectasis or partial consolidation, not well seen on frontal view; short interval radiographic follow-up is recommended.         Assessment:

## 2017-07-11 NOTE — Patient Instructions (Addendum)
For cough > mucinex 1200 mg every 12 hour and flutter valve as much as possible  The key is to stop smoking completely before smoking completely stops you!   Prevnar 13 and flu vaccines today   See your dentist asap   Plan A = Automatic = symbicort/ spiriva daily as you are   Plan B = Backup Only use your albuterol (proair) as a rescue medication to be used if you can't catch your breath by resting or doing a relaxed purse lip breathing pattern.  - The less you use it, the better it will work when you need it. - Ok to use the inhaler up to 2 puffs  every 4 hours if you must but call for appointment if use goes up over your usual need - Don't leave home without it !!  (think of it like the spare tire for your car)   Plan C = Crisis - only use your albuterol nebulizer if you first try Plan B and it fails to help > ok to use the nebulizer up to every 4 hours but if start needing it regularly call for immediate appointment      Please schedule a follow up office visit in 6 weeks, call sooner if needed with pfts and cxr on return

## 2017-07-12 ENCOUNTER — Encounter: Payer: Self-pay | Admitting: Internal Medicine

## 2017-07-12 DIAGNOSIS — J9611 Chronic respiratory failure with hypoxia: Secondary | ICD-10-CM | POA: Insufficient documentation

## 2017-07-12 DIAGNOSIS — K089 Disorder of teeth and supporting structures, unspecified: Secondary | ICD-10-CM | POA: Insufficient documentation

## 2017-07-12 NOTE — Assessment & Plan Note (Signed)
>   3 m   Matter of life or breath discussion/ warned re smoking and 02 danger

## 2017-07-12 NOTE — Assessment & Plan Note (Signed)
05/20/2016    try symbicort 160 2bid  - Spirometry 05/20/2016  FEV1 0.83 (24%)  Ratio 45 p am advair / neb   - PFT's  07/08/2016  FEV1 1.05 (29 % ) ratio 41  p 9 % improvement from saba p symb 160 x 2 prior to study with DLCO  50 % corrects to 56  % for alv volume   - 07/08/2016   > try symb 160/spiriva respimat 2.5   X 2 puff each am  - Alpha one AT screen 07/08/2016 >>  MS  Level 127 - 07/11/2017  After extensive coaching inhaler device  effectiveness =    75% from a baseline of 25% (Ti too short) - 07/11/2017 added flutter valve    DDX of  difficult airways management almost all start with A and  include Adherence, Ace Inhibitors, Acid Reflux, Active Sinus Disease, Alpha 1 Antitripsin deficiency, Anxiety masquerading as Airways dz,  ABPA,  Allergy(esp in young), Aspiration (esp in elderly), Adverse effects of meds,  Active smokers, A bunch of PE's (a small clot burden can't cause this syndrome unless there is already severe underlying pulm or vascular dz with poor reserve) plus two Bs  = Bronchiectasis and Beta blocker use..and one C= CHF   Adherence is always the initial "prime suspect" and is a multilayered concern that requires a "trust but verify" approach in every patient - starting with knowing how to use medications, especially inhalers, correctly, keeping up with refills and understanding the fundamental difference between maintenance and prns vs those medications only taken for a very short course and then stopped and not refilled.  - see hfa teachng - return in 6 weeks with all meds in hand using a trust but verify approach to confirm accurate Medication  Reconciliation The principal here is that until we are certain that the  patients are doing what we've asked, it makes no sense to ask them to do more.    Active smoking to of the list of suspects > see sep a/p  ? Aspiration > very poor dentition def risk  ? Bronchiectasis >  Would not be surprised to see it on hrct but only change in  rx = add flutter which he needs anyway at this point   I had an extended discussion with the patient reviewing all relevant studies completed to date and  lasting 25 minutes of a 40  minute transition of care office  visit to re-establish     re  severe non-specific but potentially very serious refractory respiratory symptoms of uncertain and potentially multiple  etiologies.   Each maintenance medication was reviewed in detail including most importantly the difference between maintenance and prns and under what circumstances the prns are to be triggered using an action plan format that is not reflected in the computer generated alphabetically organized AVS.    Please see AVS for specific instructions unique to this office visit that I personally wrote and verbalized to the the pt in detail and then reviewed with pt  by my nurse highlighting any changes in therapy/plan of care  recommended at today's visit.

## 2017-07-12 NOTE — Assessment & Plan Note (Addendum)
Newly placed on 02 at d/c from cap admit  05/31/17 @ 2lpm 24/7   Adequate control on present rx, reviewed in detail with pt > no change in rx needed

## 2017-07-12 NOTE — Assessment & Plan Note (Signed)
Step urinary antigen Pos 05/26/17 with no as dz on pCXR - PA and Lat cxr Pos L sup segmental as dz 07/11/2017 but clinically resolved   The density can only be seen on lateral view which was not done during admit so present cxr likely the result of the cap and now a "new finding" but will require short term f/u > cxr in 6 weeks unless new symptoms in meantime  ? Underlying bronchiectasis?

## 2017-07-12 NOTE — Assessment & Plan Note (Signed)
Advised needs to seek dental care asap

## 2017-07-30 ENCOUNTER — Telehealth: Payer: Self-pay | Admitting: Pulmonary Disease

## 2017-07-30 NOTE — Telephone Encounter (Signed)
PA initiated. [facility] form faxed back. Will await response.

## 2017-08-06 NOTE — Telephone Encounter (Signed)
Called [facility] to check on PA determination. Part of the form that was faxed was incomplete, the PA was denied. A new form has been filled out completely and faxed to [facility]. [facility] follow up.

## 2017-08-07 NOTE — Telephone Encounter (Signed)
PA is still pending as of today:

## 2017-08-11 NOTE — Telephone Encounter (Signed)
Called [facility] to check status of PA and was told that pt's med has been approved through 08/01/2018.  Called pt letting him know he has been approved for the spiriva respimat.  Stated to him that I had tried to call his pharmacy but keep getting a busy signal.  Will keep trying to see if I can get through so we can have med refilled for pt.  Called and spoke with Tameika at pt's pharmacy letting her know med was approved and she stated to me pt had already come and picked med up.  Nothing further needed at this current time.

## 2017-08-22 ENCOUNTER — Ambulatory Visit: Payer: Medicaid Other | Admitting: Internal Medicine

## 2017-08-25 ENCOUNTER — Ambulatory Visit (INDEPENDENT_AMBULATORY_CARE_PROVIDER_SITE_OTHER)
Admission: RE | Admit: 2017-08-25 | Discharge: 2017-08-25 | Disposition: A | Discharge status: Home / Self Care | Payer: Medicaid Other | Source: Ambulatory Visit | Attending: Internal Medicine | Admitting: Internal Medicine

## 2017-08-25 ENCOUNTER — Encounter: Payer: Self-pay | Admitting: Internal Medicine

## 2017-08-25 ENCOUNTER — Ambulatory Visit: Payer: Medicaid Other | Admitting: Internal Medicine

## 2017-08-25 VITALS — BP 142/74 | HR 100 | Ht 67.5 in | Wt 114.0 lb

## 2017-08-25 DIAGNOSIS — F17210 Nicotine dependence, cigarettes, uncomplicated: Secondary | ICD-10-CM | POA: Diagnosis not present

## 2017-08-25 DIAGNOSIS — J9611 Chronic respiratory failure with hypoxia: Secondary | ICD-10-CM | POA: Diagnosis not present

## 2017-08-25 DIAGNOSIS — J449 Chronic obstructive pulmonary disease, unspecified: Secondary | ICD-10-CM

## 2017-08-25 LAB — PULMONARY FUNCTION TEST
FEF 25-75 PRE: 0.34 L/s
FEF 25-75 Post: 0.41 L/sec
FEF2575-%CHANGE-POST: 23 %
FEF2575-%PRED-POST: 14 %
FEF2575-%PRED-PRE: 11 %
FEV1-%Change-Post: 4 %
FEV1-%PRED-POST: 25 %
FEV1-%Pred-Pre: 24 %
FEV1-POST: 0.87 L
FEV1-PRE: 0.83 L
FEV1FVC-%CHANGE-POST: 0 %
FEV1FVC-%Pred-Pre: 50 %
FEV6-%CHANGE-POST: 9 %
FEV6-%Pred-Post: 51 %
FEV6-%Pred-Pre: 46 %
FEV6-PRE: 1.99 L
FEV6-Post: 2.18 L
FEV6FVC-%Change-Post: 4 %
FEV6FVC-%PRED-PRE: 95 %
FEV6FVC-%Pred-Post: 99 %
FVC-%CHANGE-POST: 4 %
FVC-%PRED-PRE: 48 %
FVC-%Pred-Post: 51 %
FVC-PRE: 2.17 L
FVC-Post: 2.28 L
POST FEV1/FVC RATIO: 38 %
PRE FEV1/FVC RATIO: 38 %
Post FEV6/FVC ratio: 96 %
Pre FEV6/FVC Ratio: 91 %
RV % pred: 268 %
RV: 5.48 L
TLC % pred: 124 %
TLC: 8.07 L

## 2017-08-25 MED ORDER — PREDNISONE 10 MG PO TABS: ORAL_TABLET | ORAL | 0 refills | Status: DC

## 2017-08-25 MED ORDER — AZITHROMYCIN 250 MG PO TABS: ORAL_TABLET | ORAL | 0 refills | Status: DC

## 2017-08-25 MED ORDER — IPRATROPIUM-ALBUTEROL 0.5-2.5 (3) MG/3ML IN SOLN: 3.0000 mL | RESPIRATORY_TRACT | 0 refills | Status: DC | PRN

## 2017-08-25 NOTE — Progress Notes (Signed)
Patient Performed PFT today.

## 2017-08-25 NOTE — Patient Instructions (Addendum)
The key is to stop smoking completely before smoking completely stops you!   zpak   Prednisone 10 mg take  4 each am x 2 days,   2 each am x 2 days,  1 each am x 2 days and stop   Please remember to go to the  x-ray department downstairs in the basement  for your tests - we will call you with the results when they are available.   Please schedule a follow up office visit in 6 weeks, call sooner if needed

## 2017-08-25 NOTE — Progress Notes (Signed)
Subjective:     Patient ID: Tony [name], male   DOB: [birth year]     MRN: [id number]    Brief patient profile:  [age] yowm active smoker dx copd  2014 with onset of doe in 2010 and maint on advair/ albuterol [facility] pulmonary [facility] by Kizzie Furnishochelle Muse, NP with GOLD IV criteria 05/20/16 and MS phenotype with nl alpha one AT levels documented 07/08/16    History of Present Illness  05/20/2016 1st [facility] office visit/ Wert  maint rx advair 250 bid/ neb alb tid and saba hfa prn  Chief Complaint  Patient presents with  . Pulmonary Consult    Referred by Kizzie Furnishochelle Muse, NP. Pt c/o SOB since 2014, worse "for a while".   He gets winded just walking short distances such as walking to his back yard. He also c/o left side pain. He also c/o cough- prod with thick, clear to white sputum.    doe x MMRC3 = can't walk 100 yards even at a slow pace at a flat grade s stopping due to sob  Without much fluctuation  Has bilateral L CP  24/7 x 3 years but worse with coughing and when lying on L side  rec Plan A = Automatic = Symbicort 160 Take 2 puffs first thing in am and then another 2 puffs about 12 hours later.  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 He complains of worsen nonproductive cough without fever, chills, hemoptysis. Initial CXR showed atelectasis vs early interstitial pneumonia. The patient was started on IV solumedrol, Duonebs and antibiotics.    Discharge Diagnoses:  Acute respiratory failure with hypoxia -secondary to COPD exacerbation -presently stable on2L [place] -very little  functional reserve--dyspneic with minimal exertion -ambulatory pulse ox showed desaturation on RA -set up home oxygen--2L  COPD Exacerbation -continueDuoNebs q 4 hours during the hospitalzation -d/c spiriva -continue solu-medrol>>>prednisone taper over 2 weeks after d/c -continuepulmicort -continue ceftriaxone>>home with levofloxacin 2 days  -do not feel pt has pneumonia--urine strep pneumoniae antigen nonspecific -slow improvementeach day  Tobacco abuse -cessation discussed -nicotine patch  Poor Health Literacy -pt cannot read or write  Alcohol dependence -drinks up to 6 beers daily -CIWA -no signs of withdrawal     07/11/2017  f/u ov/Wert re:  Transition of care s/p aeCopd /newly on 02 2lpm 25/7  poor hfa and still smoking Chief Complaint  Patient presents with  . Follow-up    Pt states he has good days and bad days. On 07/09/17 and 07/10/17, pt states his breathing was a lot worse with the weather change. Breathing has been doing good today. Pt does have occ coughing but is unable to get any mucus up. Denies any CP.   overall about the same [facility] = can't walk 100 yards even at a slow pace at a flat grade s stopping due to sob  Even on 02/ has difficulty with chest congestion/ rattling but what mucus he does bring up is mucoid, thick, not purulent worse in am's but able to  sleep ok on 2 pillows  No obvious patterns day to day or daytime variability or assoc   mucus plugs or hemoptysis or cp or chest tightness, subjective wheeze or overt sinus or hb symptoms. No unusual exposure hx or h/o childhood pna/ asthma or knowledge of premature birth.  Sleeping ok ok 2lpm on 2 pillows without nocturnal   exacerbation  of respiratory  c/o's or need for noct saba. Also denies any obvious fluctuation of symptoms with weather or environmental changes or other aggravating or alleviating factors except as outlined above  rec For cough > mucinex 1200 mg every 12  hour and flutter valve as much as possible The key is to stop smoking completely before smoking completely stops you!  Prevnar 13 and flu vaccines today  See your dentist asap  Plan A = Automatic = symbicort/ spiriva daily as you are  Plan B = Backup Only use your albuterol (proair) as a rescue medication  Plan C = Crisis - only use your albuterol nebulizer if you first try Plan B and it fails to help > ok to use the nebulizer up to every 4 hours but if start needing it regularly call for immediate appointment          08/25/2017  f/u ov/Wert re:  GOLD IV/ 02 dep but doesn't use it at rest  Chief Complaint  Patient presents with  . Follow-up    PFT's done today. He states his breathing "depends on the weather". He is using his albuterol inhaler 1 x per wk on average and he uses neb once per day.   Dyspnea:  MMRC3 = can't walk 100 yards even at a slow pace at a flat grade s stopping due to sob  /even on 2lpm  Cough:  mucinex / flutter > minimal mucoid esp in am in cold weather  Sleep: on 2lpm  SABA use:  Once a week  Wither hfa or neb    No obvious day to day or daytime variability or assoc excess/ purulent sputum or mucus plugs or hemoptysis or cp or chest tightness, subjective wheeze or overt sinus or hb symptoms. No unusual exposure hx or h/o childhood pna/ asthma or knowledge of premature birth.  Sleeping ok flat without nocturnal  or early am exacerbation  of respiratory  c/o's or need for noct saba. Also denies any obvious fluctuation of symptoms with weather or environmental changes or other aggravating or alleviating factors except as outlined above   Current Allergies, Complete Past Medical History, Past Surgical History, Family History, and Social History were reviewed in [facility] electronic [facility] record.  ROS  The following are not active complaints unless bolded Hoarseness, sore throat, dysphagia, dental problems, itching, sneezing,  nasal congestion or discharge of  excess mucus or purulent secretions, ear ache,   fever, chills, sweats, unintended wt loss or wt gain, classically pleuritic or exertional cp,  orthopnea pnd or leg swelling, presyncope, palpitations, abdominal pain, anorexia, nausea, vomiting, diarrhea  or change in bowel habits or change in bladder habits, change in stools or change in urine, dysuria, hematuria,  rash, arthralgias, visual complaints, headache, numbness, weakness or ataxia or problems with walking or coordination,  change in mood/affect or memory.        Current Meds  Medication Sig  . albuterol (PROVENTIL HFA;VENTOLIN HFA) 108 (90 Base) MCG/ACT inhaler Inhale 2 puffs into the lungs every 6 (six) hours as needed. Shortness Of Breath  . aspirin EC 81 MG tablet Take  81 mg by mouth daily.  . budesonide-formoterol (SYMBICORT) 160-4.5 MCG/ACT inhaler Inhale 2 puffs into the lungs 2 (two) times daily.  Marland Kitchen ipratropium-albuterol (DUONEB) 0.5-2.5 (3) MG/3ML SOLN Take 3 mLs by nebulization every 4 (four) hours as needed (shortness of breath, wheezing).  Marland Kitchen Respiratory Therapy Supplies (FLUTTER) DEVI 1 Device by Does not apply route as directed.  . Tiotropium Bromide Monohydrate (SPIRIVA RESPIMAT) 2.5 MCG/ACT AERS Inhale 2 puffs into the lungs daily.  .                 Objective:   Physical Exam    amb chronically ill wm nad >>> stated age/rattling cough   08/25/2017          114  07/08/2016        116  05/20/16 115 lb 6.4 oz (52.3 kg)  10/29/12 133 lb (60.3 kg)  08/17/12 128 lb (58.1 kg)     Vital signs reviewed - Note on arrival 02 sats  98% on 2lpm pulsed      HEENT: nl   turbinates bilaterally, and oropharynx. Nl external ear canals without cough reflex - very poor dentition    NECK :  without JVD/Nodes/TM/ nl carotid upstrokes bilaterally   LUNGS: no acc muscle use,  Barrel chest with distant bs bilaterally, min exp rhonchi    CV:  RRR  no s3 or murmur or increase in P2, and no edema   ABD:  soft and nontender  with nl inspiratory excursion in the supine position. No bruits or organomegaly appreciated, bowel sounds nl  MS:  Nl gait/ ext warm without deformities, calf tenderness, cyanosis or clubbing No obvious joint restrictions   SKIN: warm and dry without lesions    NEURO:  alert, approp, nl sensorium with  no motor or cerebellar deficits apparent.     CXR PA and Lateral:   08/25/2017 :    I personally reviewed images and agree with radiology impression as follows:   1. No acute cardiopulmonary disease. 2. Advanced COPD, stable from the prior study.         Assessment:

## 2017-08-25 NOTE — Progress Notes (Signed)
Spoke with pt and notified of results per Dr. Wert. Pt verbalized understanding and denied any questions. 

## 2017-08-26 ENCOUNTER — Encounter: Payer: Self-pay | Admitting: Internal Medicine

## 2017-08-26 NOTE — Assessment & Plan Note (Signed)
>    3m   I took an extended  opportunity with this patient to outline the consequences of continued cigarette use  in airway disorders based on all the data we have from the multiple national lung health studies (perfomed over decades at millions of dollars in cost)  indicating that smoking cessation, not choice of inhalers or physicians, is the most important aspect of his care.   

## 2017-08-26 NOTE — Assessment & Plan Note (Signed)
Newly placed on 02 at d/c from cap admit  05/31/17 @ 2lpm 24/7   Adequate control on present rx, reviewed in detail with pt > no change in rx needed   

## 2017-08-26 NOTE — Assessment & Plan Note (Signed)
05/20/2016    try symbicort 160 2bid  - Spirometry 05/20/2016  FEV1 0.83 (24%)  Ratio 45 p am advair / neb   - PFT's  07/08/2016  FEV1 1.05 (29 % ) ratio 41  p 9 % improvement from saba p symb 160 x 2 prior to study with DLCO  50 % corrects to 56  % for alv volume   - 07/08/2016   > try symb 160/spiriva respimat 2.5   X 2 puff each am  - Alpha one AT screen 07/08/2016 >>  MS  Level 127 - 07/11/2017  After extensive coaching inhaler device  effectiveness =    75% from a baseline of 25% (Ti too short) - 07/11/2017 added flutter valve  - PFT's  08/25/2017  FEV1 0.87 (25 % ) ratio 38  p 4 % improvement from saba p symb/spiriva prior to study   - 08/25/2017  After extensive coaching inhaler device  effectiveness =   75% - short ti     Group D in terms of symptom/risk and laba/lama/ICS  therefore appropriate rx at this point.  Will rx mild flare with zpak/ pred and try to get him to focus on smoking cessation (see separate a/p)   I had an extended discussion with the patient reviewing all relevant studies completed to date and  lasting 15 to 20 minutes of a 25 minute visit    Each maintenance medication was reviewed in detail including most importantly the difference between maintenance and prns and under what circumstances the prns are to be triggered using an action plan format that is not reflected in the computer generated alphabetically organized AVS.    Please see AVS for specific instructions unique to this visit that I personally wrote and verbalized to the the pt in detail and then reviewed with pt  by my nurse highlighting any  changes in therapy recommended at today's visit to their plan of care.

## 2017-10-06 ENCOUNTER — Encounter: Payer: Self-pay | Admitting: Internal Medicine

## 2017-10-06 ENCOUNTER — Ambulatory Visit: Payer: Medicaid Other | Admitting: Internal Medicine

## 2017-10-06 VITALS — BP 124/88 | HR 78 | Ht 67.5 in | Wt 113.0 lb

## 2017-10-06 DIAGNOSIS — J449 Chronic obstructive pulmonary disease, unspecified: Secondary | ICD-10-CM | POA: Diagnosis not present

## 2017-10-06 DIAGNOSIS — F17210 Nicotine dependence, cigarettes, uncomplicated: Secondary | ICD-10-CM

## 2017-10-06 DIAGNOSIS — J9611 Chronic respiratory failure with hypoxia: Secondary | ICD-10-CM

## 2017-10-06 MED ORDER — PREDNISONE 10 MG PO TABS: ORAL_TABLET | ORAL | 0 refills | Status: DC

## 2017-10-06 NOTE — Assessment & Plan Note (Signed)
Newly placed on 02 at d/c from cap admit  05/31/17 @ 2lpm 24/7    Adequate control on present rx, reviewed in detail with pt > no change in rx needed

## 2017-10-06 NOTE — Progress Notes (Signed)
Subjective:     Patient ID: Tony DraftsFrank A [name], male   DOB: [birth year]     MRN: [id number]    Brief patient profile:  [age] yowm active smoker dx copd  2014 with onset of doe in 2010 and maint on advair/ albuterol referred to pulmonary clinic [facility] by Kizzie Furnishochelle Muse, NP with GOLD IV criteria 05/20/16 and MS phenotype with nl alpha one AT levels documented 07/08/16    History of Present Illness  05/20/2016 1st [facility] office visit/ Phiona Ramnauth  maint rx advair 250 bid/ neb alb tid and saba hfa prn  Chief Complaint  Patient presents with  . Pulmonary Consult    Referred by Kizzie Furnishochelle Muse, NP. Pt c/o SOB since 2014, worse "for a while".   He gets winded just walking short distances such as walking to his back yard. He also c/o left side pain. He also c/o cough- prod with thick, clear to white sputum.    doe x MMRC3 = can't walk 100 yards even at a slow pace at a flat grade s stopping due to sob  Without much fluctuation  Has bilateral L CP  24/7 x 3 years but worse with coughing and when lying on L side  rec Plan A = Automatic = Symbicort 160 Take 2 puffs first thing in am and then another 2 puffs about 12 hours later.  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 He complains of worsen nonproductive cough without fever, chills, hemoptysis. Initial CXR showed atelectasis vs early interstitial pneumonia. The patient was started on IV solumedrol, Duonebs and antibiotics.    Discharge Diagnoses:  Acute respiratory failure with hypoxia -secondary to COPD exacerbation -presently stable on2L [place] -very little  functional reserve--dyspneic with minimal exertion -ambulatory pulse ox showed desaturation on RA -set up home oxygen--2L  COPD Exacerbation -continueDuoNebs q 4 hours during the hospitalzation -d/c spiriva -continue solu-medrol>>>prednisone taper over 2 weeks after d/c -continuepulmicort -continue ceftriaxone>>home with levofloxacin 2 days  -do not feel pt has pneumonia--urine strep pneumoniae antigen nonspecific -slow improvementeach day  Tobacco abuse -cessation discussed -nicotine patch  Poor Health Literacy -pt cannot read or write  Alcohol dependence -drinks up to 6 beers daily -CIWA -no signs of withdrawal     07/11/2017  f/u ov/Lillyian Heidt re:  Transition of care s/p aeCopd /newly on 02 2lpm 25/7  poor hfa and still smoking Chief Complaint  Patient presents with  . Follow-up    Pt states he has good days and bad days. On 07/09/17 and 07/10/17, pt states his breathing was a lot worse with the weather change. Breathing has been doing good today. Pt does have occ coughing but is unable to get any mucus up. Denies any CP.   overall about the same [facility] can't walk 100 yards even at a slow pace at a flat grade s stopping due to sob  Even on 02/ has difficulty with chest congestion/ rattling but what mucus he does bring up is mucoid, thick, not purulent worse in am's but able to  sleep ok on 2 pillows  No obvious patterns day to day or daytime variability or assoc   mucus plugs or hemoptysis or cp or chest tightness, subjective wheeze or overt sinus or hb symptoms. No unusual exposure hx or h/o childhood pna/ asthma or knowledge of premature birth.  Sleeping ok ok 2lpm on 2 pillows without nocturnal   exacerbation  of respiratory  c/o's or need for noct saba. Also denies any obvious fluctuation of symptoms with weather or environmental changes or other aggravating or alleviating factors except as outlined above  rec For cough > mucinex 1200 mg every 12  hour and flutter valve as much as possible The key is to stop smoking completely before smoking completely stops you!  Prevnar 13 and flu vaccines today  See your dentist asap  Plan A = Automatic = symbicort/ spiriva daily as you are  Plan B = Backup Only use your albuterol (proair) as a rescue medication  Plan C = Crisis - only use your albuterol nebulizer if you first try Plan B and it fails to help > ok to use the nebulizer up to every 4 hours but if start needing it regularly call for immediate appointment          08/25/2017  f/u ov/Jakita Dutkiewicz re:  GOLD IV/ 02 dep but doesn't use it at rest  Chief Complaint  Patient presents with  . Follow-up    PFT's done today. He states his breathing "depends on the weather". He is using his albuterol inhaler 1 x per wk on average and he uses neb once per day.   Dyspnea:  MMRC3 = can't walk 100 yards even at a slow pace at a flat grade s stopping due to sob  /even on 2lpm  Cough:  mucinex / flutter > minimal mucoid esp in am in cold weather  Sleep: on 2lpm  SABA use:  Once a week  Wither hfa or neb   rec  The key is to stop smoking completely before smoking completely stops you!  zpak  Prednisone 10 mg take  4 each am x 2 days,   2 each am x 2 days,  1 each am x 2 days and stop     10/06/2017  f/u ov/Shelly Spenser re:  GOLD IV / 02 dep but still smoking  Chief Complaint  Patient presents with  . Follow-up    Breathing worse, relates to the pollen. He is using his albuterol inhaler 2-3 x per wk on average and neb 3 x daily on average.   Dyspnea:  MMRC3 = can't walk 100 yards even at a slow pace at a flat grade s stopping due to sob even on 02  Cough: mostly am and hs / no excess mucus  Sleep: sorta  flat SABA use: thinks the neb helps his nose   Worse obst since pollen seasons/ NS also helps   No obvious day to day or daytime variability or assoc excess/ purulent sputum or mucus plugs or hemoptysis or cp or chest tightness, subjective wheeze or overt    hb symptoms. No unusual exposure hx or h/o childhood pna/ asthma or knowledge of premature birth.  Sleeping  Ok on side on 2lpm   without nocturnal  or early am exacerbation  of respiratory  c/o's or need for noct saba. Also denies any obvious fluctuation of symptoms with weather or environmental changes or other aggravating or alleviating factors except as outlined above   Current Allergies, Complete Past  Medical History, Past Surgical History, Family History, and Social History were reviewed in [employer] record.  ROS  The following are not active complaints unless bolded Hoarseness, sore throat, dysphagia, dental problems, itching, sneezing,  nasal congestion or discharge of excess mucus or purulent secretions, ear ache,   fever, chills, sweats, unintended wt loss or wt gain, classically pleuritic or exertional cp,  orthopnea pnd or arm/hand swelling  or leg swelling, presyncope, palpitations, abdominal pain, anorexia, nausea, vomiting, diarrhea  or change in bowel habits or change in bladder habits, change in stools or change in urine, dysuria, hematuria,  rash, arthralgias, visual complaints, headache, numbness, weakness or ataxia or problems with walking or coordination,  change in mood or  memory.        Current Meds  Medication Sig  . albuterol (PROVENTIL HFA;VENTOLIN HFA) 108 (90 Base) MCG/ACT inhaler Inhale 2 puffs into the lungs every 6 (six) hours as needed. Shortness Of Breath  . aspirin EC 81 MG tablet Take 81 mg by mouth daily.  . budesonide-formoterol (SYMBICORT) 160-4.5 MCG/ACT inhaler Inhale 2 puffs into the lungs 2 (two) times daily.  Marland Kitchen ipratropium-albuterol (DUONEB) 0.5-2.5 (3) MG/3ML SOLN Take 3 mLs by nebulization every 4 (four) hours as needed (shortness of breath, wheezing).  . OXYGEN 2lpm as needed per pt  . Respiratory Therapy Supplies (FLUTTER) DEVI 1 Device by Does not apply route as directed.  . Tiotropium Bromide Monohydrate (SPIRIVA RESPIMAT)  2.5 MCG/ACT AERS Inhale 2 puffs into the lungs daily.                         Objective:   Physical Exam    amb wm > stated age/ nad    10/06/2017        113  08/25/2017          114  07/08/2016        116  05/20/16 115 lb 6.4 oz (52.3 kg)  10/29/12 133 lb (60.3 kg)  08/17/12 128 lb (58.1 kg)     Vital signs reviewed - Note on arrival 02 sats  98% on  2lpm        HEENT: nl  ropharynx. Nl external ear canals without cough reflex - moderate bilateral non-specific turbinate edema   - poor dentition    NECK :  without JVD/Nodes/TM/ nl carotid upstrokes bilaterally   LUNGS: no acc muscle use,  Mod barrel  contour chest wall with bilateral  Distant bs s audible wheeze and  without cough on insp or exp maneuver and mod   Hyperresonant  to  percussion bilaterally     CV:  RRR  no s3 or murmur or increase in P2, and no edema   ABD:  soft and nontender with pos mid insp Hoover's  in the supine position. No bruits or organomegaly appreciated, bowel sounds nl  MS:   Nl gait/  ext warm without deformities, calf tenderness, cyanosis or clubbing No obvious joint restrictions   SKIN: warm and dry without lesions    NEURO:  alert, approp, nl sensorium with  no motor or cerebellar deficits apparent.                Assessment:

## 2017-10-06 NOTE — Patient Instructions (Addendum)
Saline as much as you want for your nose - bring your nasal spray with you on return   Prednisone 10 mg take  4 each am x 2 days,   2 each am x 2 days,  1 each am x 2 days and stop   Please schedule a follow up visit in 3 months but call sooner if needed  with all medications /inhalers/ solutions in hand so we can verify exactly what you are taking. This includes all medications from all doctors and over the counters

## 2017-10-06 NOTE — Assessment & Plan Note (Signed)

## 2017-10-06 NOTE — Assessment & Plan Note (Signed)
05/20/2016    try symbicort 160 2bid  - Spirometry 05/20/2016  FEV1 0.83 (24%)  Ratio 45 p am advair / neb   - PFT's  07/08/2016  FEV1 1.05 (29 % ) ratio 41  p 9 % improvement from saba p symb 160 x 2 prior to study with DLCO  50 % corrects to 56  % for alv volume   - 07/08/2016   > try symb 160/spiriva respimat 2.5   X 2 puff each am  - Alpha one AT screen 07/08/2016 >>  MS  Level 127 - 07/11/2017  After extensive coaching inhaler device  effectiveness =    75% from a baseline of 25% (Ti too short) - 07/11/2017 added flutter valve  - PFT's  08/25/2017  FEV1 0.87 (25 % ) ratio 38  p 4 % improvement from saba p symb/spiriva prior to study   - 10/06/2017  After extensive coaching inhaler device  effectiveness =    90%    Overusing neb related to nasal symptoms which may indicate the anticholinergic in duoneb is helping or the saline mist in the mask he's using is helping this problem but either way he's using too much albuterol/ reviewed  For now : Prednisone 10 mg take  4 each am x 2 days,   2 each am x 2 days,  1 each am x 2 days and stop   NS for nose, try to reduce neb dep/ stop smoking (see separate a/p)

## 2017-10-21 ENCOUNTER — Telehealth: Payer: Self-pay | Admitting: Internal Medicine

## 2017-10-21 NOTE — Telephone Encounter (Signed)
Letter can state pt has severe copd and chronic respiratory failure and is 02 dep indefinitely and not physically suitable for jury duty nor will he ever be.

## 2017-10-21 NOTE — Telephone Encounter (Signed)
Called and spoke with patients neighbor she states that the patient went to jury duty today but due to him being on oxygen and in a confined place they sent him home. The court is asking for a letter to be faxed over stating that the patient is on oxygen so that they will not contact him again.   MW please advise.

## 2017-10-21 NOTE — Telephone Encounter (Signed)
Letter has been typed, printed, and faxed to correct fax number. Patients neighbor is aware.

## 2017-10-21 NOTE — Progress Notes (Signed)
Letter typed per MW.

## 2017-12-18 ENCOUNTER — Telehealth: Payer: Self-pay | Admitting: Internal Medicine

## 2017-12-18 NOTE — Telephone Encounter (Addendum)
[facility]  Upon inspection of the forms, they are regarding patient's Spiriva Respimat.  Per the 2.6.19 phone note, a PA was done and approved through 07/2018.  However, per this form Medicare is now DENYING the Spiriva Resp because "the quantity was missing from the request.  You have the right to appeal this decision.  To appeal, you must complete and file the attached Medicaid Recipient Services Hearing Request form asking for a hearing with the Office of Administrative Hearings.  YOU HAVE 30 DAYS FROM THE DATE OF THIS LETTER TO FILE THE REQUEST FOR HEARING."  The letter is dated November 26, 2017   Letter placed in MW's lookat.

## 2017-12-19 NOTE — Telephone Encounter (Signed)
Attempted to call patient, no voicemail set up at this time. Will try again at later time regarding below message from [facility] and MW.

## 2017-12-19 NOTE — Telephone Encounter (Signed)
Per MW- give samples to last until ov 01/04/18 and needs to bring drug formulary to that visit, thanks

## 2017-12-22 NOTE — Telephone Encounter (Signed)
Attempted to contact pt. I did not receive an answer. There was no option for me to leave a message due to his voicemail not being set up. Will try back.  

## 2017-12-23 NOTE — Telephone Encounter (Signed)
Attempted to contact pt. I did not receive an answer. There was no option for me to leave a message due to his voicemail not being set up. Will try back.  

## 2017-12-24 NOTE — Telephone Encounter (Signed)
We have attempted to contact the pt several times with no success or call back from the pt. Per triage protocol, message will be closed.  

## 2018-01-05 ENCOUNTER — Encounter: Payer: Self-pay | Admitting: Internal Medicine

## 2018-01-05 ENCOUNTER — Ambulatory Visit (INDEPENDENT_AMBULATORY_CARE_PROVIDER_SITE_OTHER): Payer: Medicaid Other | Admitting: Internal Medicine

## 2018-01-05 VITALS — BP 106/70 | HR 66 | Ht 66.0 in | Wt 109.0 lb

## 2018-01-05 DIAGNOSIS — F17210 Nicotine dependence, cigarettes, uncomplicated: Secondary | ICD-10-CM | POA: Diagnosis not present

## 2018-01-05 DIAGNOSIS — J9611 Chronic respiratory failure with hypoxia: Secondary | ICD-10-CM | POA: Diagnosis not present

## 2018-01-05 DIAGNOSIS — J449 Chronic obstructive pulmonary disease, unspecified: Secondary | ICD-10-CM | POA: Diagnosis not present

## 2018-01-05 NOTE — Progress Notes (Signed)
Subjective:     Patient ID: Tony [name], male   DOB: 1960-07-14     MRN: [id number]    Brief patient profile:  [age] yowm active smoker dx copd  2014 with onset of doe in 2010 and maint on advair/ albuterol referred to pulmonary clinic [facility] by Tony Furnishochelle Muse, NP with GOLD IV criteria 05/20/16 and MS phenotype with nl alpha one AT levels documented 07/08/16       History of Present Illness  05/20/2016 1st [facility] office visit/ Tony [name]  maint rx advair 250 bid/ neb alb tid and saba hfa prn  Chief Complaint  Patient presents with  . Pulmonary Consult    Referred by Tony Furnishochelle Muse, NP. Pt c/o SOB since 2014, worse "for a while".   He gets winded just walking short distances such as walking to his back yard. He also c/o left side pain. He also c/o cough- prod with thick, clear to white sputum.    doe x MMRC3 = can't walk 100 yards even at a slow pace at a flat grade s stopping due to sob  Without much fluctuation  Has bilateral L CP  24/7 x 3 years but worse with coughing and when lying on L side  rec Plan A = Automatic = Symbicort 160 Take 2 puffs first thing in am and then another 2 puffs about 12 hours later.  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 He complains of worsen nonproductive cough without fever, chills, hemoptysis. Initial CXR showed atelectasis vs early interstitial pneumonia. The patient was started on IV solumedrol, Duonebs and antibiotics.    Discharge Diagnoses:  Acute respiratory failure with hypoxia -secondary to COPD exacerbation -presently stable on2L [place] -very little  functional reserve--dyspneic with minimal exertion -ambulatory pulse ox showed desaturation on RA -set up home oxygen--2L  COPD Exacerbation -continueDuoNebs q 4 hours during the hospitalzation -d/c spiriva -continue solu-medrol>>>prednisone taper over 2 weeks after d/c -continuepulmicort -continue ceftriaxone>>home with levofloxacin 2 days  -do not feel pt has pneumonia--urine strep pneumoniae antigen nonspecific -slow improvementeach day  Tobacco abuse -cessation discussed -nicotine patch  Poor Health Literacy -pt cannot read or write  Alcohol dependence -drinks up to 6 beers daily -CIWA -no signs of withdrawal     07/11/2017  f/u ov/Tony [name] re:  Transition of care s/p aeCopd /newly on 02 2lpm 25/7  poor hfa and still smoking Chief Complaint  Patient presents with  . Follow-up    Pt states he has good days and bad days. On 07/09/17 and 07/10/17, pt states his breathing was a lot worse with the weather change. Breathing has been doing good today. Pt does have occ coughing but is unable to get any mucus up. Denies any CP.   overall about the same [facility] can't walk 100 yards even at a slow pace at a flat grade s stopping due to sob  Even on 02/ has difficulty with chest congestion/ rattling but what mucus he does bring up is mucoid, thick, not purulent worse in am's  but able to sleep ok on 2 pillows  No obvious patterns day to day or daytime variability or assoc   mucus plugs or hemoptysis or cp or chest tightness, subjective wheeze or overt sinus or hb symptoms. No unusual exposure hx or h/o childhood pna/ asthma or knowledge of premature birth.  Sleeping ok ok 2lpm on 2 pillows without nocturnal   exacerbation  of respiratory  c/o's or need for noct saba. Also denies any obvious fluctuation of symptoms with weather or environmental changes or other aggravating or alleviating factors except as outlined above  rec For cough > mucinex 1200 mg every 12  hour and flutter valve as much as possible The key is to stop smoking completely before smoking completely stops you!  Prevnar 13 and flu vaccines today  See your dentist asap  Plan A = Automatic = symbicort/ spiriva daily as you are  Plan B = Backup Only use your albuterol (proair) as a rescue medication  Plan C = Crisis - only use your albuterol nebulizer if you first try Plan B and it fails to help > ok to use the nebulizer up to every 4 hours but if start needing it regularly call for immediate appointment          08/25/2017  f/u ov/Tony [name] re:  GOLD IV/ 02 dep but doesn't use it at rest  Chief Complaint  Patient presents with  . Follow-up    PFT's done today. He states his breathing "depends on the weather". He is using his albuterol inhaler 1 x per wk on average and he uses neb once per day.   Dyspnea:  MMRC3 = can't walk 100 yards even at a slow pace at a flat grade s stopping due to sob  /even on 2lpm  Cough:  mucinex / flutter > minimal mucoid esp in am in cold weather  Sleep: on 2lpm  SABA use:  Once a week  Wither hfa or neb   rec  The key is to stop smoking completely before smoking completely stops you!  zpak  Prednisone 10 mg take  4 each am x 2 days,   2 each am x 2 days,  1 each am x 2 days and stop     10/06/2017  f/u ov/Tony [name] re:  GOLD IV / 02 dep but still smoking  Chief Complaint  Patient presents with  . Follow-up    Breathing worse, relates to the pollen. He is using his albuterol inhaler 2-3 x per wk on average and neb 3 x daily on average.   Dyspnea:  MMRC3 = can't walk 100 yards even at a slow pace at a flat grade s stopping due to sob even on 02  Cough: mostly am and hs / no excess mucus  Sleep: sorta  flat SABA use: thinks the neb helps his nose   Worse obst since pollen seasons/ NS also helps  rec Saline as much as you want for your nose - bring your nasal spray with you on return  Prednisone 10 mg take  4 each am x 2 days,   2 each am x 2 days,  1 each  am x 2 days and stop    01/05/2018  f/u ov/Tony [name] re: copd gold iv/ 02 dep / still smoking on symb / spiriva smi  Chief Complaint  Patient presents with  . Follow-up    Breathing is overall doing well. He is using his albuterol inhaler 2-3 x per day and  neb about once per day.    Dyspnea:  [employer] one ailse on 02 = MMRC3 = can't walk 100 yards even at a slow pace at a flat grade s stopping due to sob   Cough:  Better/ no am flare   SABA use: as above/ mostly when goes out in heat and over does it 02: 2lpm 24/7       No obvious day to day or daytime variability or assoc excess/ purulent sputum or mucus plugs or hemoptysis or cp or chest tightness, subjective wheeze or overt sinus or hb symptoms.   Sleeping: 2lpm / one pillow  without nocturnal  or early am exacerbation  of respiratory  c/o's or need for noct saba. Also denies any obvious fluctuation of symptoms with weather or environmental changes or other aggravating or alleviating factors except as outlined above   No unusual exposure hx or h/o childhood pna/ asthma or knowledge of premature birth.  Current Allergies, Complete Past Medical History, Past Surgical History, Family History, and Social History were reviewed in [employer] record.  ROS  The following are not active complaints unless bolded Hoarseness, sore throat, dysphagia, dental problems, itching, sneezing,  nasal congestion or discharge of excess mucus or purulent secretions, ear ache,   fever, chills, sweats, unintended wt loss or wt gain, classically pleuritic or exertional cp,  orthopnea pnd or arm/hand swelling  or leg swelling, presyncope, palpitations, abdominal pain, anorexia, nausea, vomiting, diarrhea  or change in bowel habits or change in bladder habits, change in stools or change in urine, dysuria, hematuria,  rash, arthralgias, visual complaints, headache, numbness, weakness or ataxia or problems with walking or coordination,  change in  mood or  memory.        Current Meds  Medication Sig  . albuterol (PROVENTIL HFA;VENTOLIN HFA) 108 (90 Base) MCG/ACT inhaler Inhale 2 puffs into the lungs every 6 (six) hours as needed. Shortness Of Breath  . aspirin EC 81 MG tablet Take 81 mg by mouth daily.  . budesonide-formoterol (SYMBICORT) 160-4.5 MCG/ACT inhaler Inhale 2 puffs into the lungs 2 (two) times daily.  Marland Kitchen ipratropium-albuterol (DUONEB) 0.5-2.5 (3) MG/3ML SOLN Take 3 mLs by nebulization every 4 (four) hours as needed (shortness of breath, wheezing).  . OXYGEN 2lpm as needed per pt  . Respiratory Therapy Supplies (FLUTTER) DEVI 1 Device by Does not apply route as directed.  . Tiotropium Bromide Monohydrate (SPIRIVA RESPIMAT) 2.5 MCG/ACT AERS Inhale 2 puffs into the lungs daily.                      Objective:   Physical Exam    amb wf nad    10/06/2017        113  08/25/2017          114  07/08/2016        116  05/20/16 115 lb 6.4 oz (52.3 kg)  10/29/12 133 lb (60.3 kg)  08/17/12 128 lb (58.1 kg)     Vital signs reviewed - Note on arrival 02 sats  98% on 2lpm Pulsed       HEENT: Poor dentition - nl  oropharynx. Nl external ear canals without cough reflex - moderate bilateral non-specific turbinate edema     NECK :  without JVD/Nodes/TM/ nl carotid upstrokes bilaterally   LUNGS: no acc muscle use,  Mod barrel  contour chest wall with bilateral  Distant bs s audible wheeze and  without cough on insp  or exp maneuver and mod   Hyperresonant  to  percussion bilaterally     CV:  RRR  no s3 or murmur or increase in P2, and no edema   ABD:  soft and nontender with pos mid insp Hoover's  in the supine position. No bruits or organomegaly appreciated, bowel sounds nl  MS:   Nl gait/  ext warm without deformities, calf tenderness, cyanosis or clubbing No obvious joint restrictions   SKIN: warm and dry without lesions    NEURO:  alert, approp, nl sensorium with  no motor or cerebellar deficits apparent.                  Assessment:

## 2018-01-05 NOTE — Patient Instructions (Addendum)
The key is to stop smoking completely before smoking completely stops you!    Work on inhaler technique:  relax and gently blow all the way out then take a nice smooth deep breath back in, triggering the inhaler at same time you start breathing in.  Hold for up to 5 seconds if you can. Blow out thru nose. Rinse and gargle with water when done   Please schedule a follow up visit in 6  months but call sooner if needed      

## 2018-01-06 ENCOUNTER — Encounter: Payer: Self-pay | Admitting: Internal Medicine

## 2018-01-06 NOTE — Assessment & Plan Note (Signed)
4-5 min discussion re active cigarette smoking in addition to office E&M  Ask about tobacco use:   Ongoing / poorly motivate Advise quitting   Matter of life or breath reviewed Assess willingness:  Not committed at this point Assist in quit attempt:  Per PCP when ready Arrange follow up:   Follow up per Primary Care planned

## 2018-01-06 NOTE — Assessment & Plan Note (Addendum)
Newly placed on 02 at d/c from cap admit  05/31/17 @ 2lpm 24/7    Adequate control on present rx, reviewed in detail with pt > no change in rx needed  = 2lpm 24/7 and advised re use of 02 and cigs and fire hazards    I had an extended discussion with the patient reviewing all relevant studies completed to date and  lasting 15 to 20 minutes of a 25 minute visit    See device teaching which extended face to face time for this visit.  Each maintenance medication was reviewed in detail including emphasizing most importantly the difference between maintenance and prns and under what circumstances the prns are to be triggered using an action plan format that is not reflected in the computer generated alphabetically organized AVS which I have not found useful in most complex patients, especially with respiratory illnesses  Please see AVS for specific instructions unique to this visit that I personally wrote and verbalized to the the pt in detail and then reviewed with pt  by my nurse highlighting any  changes in therapy recommended at today's visit to their plan of care.

## 2018-01-06 NOTE — Assessment & Plan Note (Signed)
05/20/2016    try symbicort 160 2bid  - Spirometry 05/20/2016  FEV1 0.83 (24%)  Ratio 45 p am advair / neb   - PFT's  07/08/2016  FEV1 1.05 (29 % ) ratio 41  p 9 % improvement from saba p symb 160 x 2 prior to study with DLCO  50 % corrects to 56  % for alv volume   - 07/08/2016   > try symb 160/spiriva respimat 2.5   X 2 puff each am  - Alpha one AT screen 07/08/2016 >>  MS  Level 127 - 07/11/2017  After extensive coaching inhaler device  effectiveness =    75% from a baseline of 25% (Ti too short) - 07/11/2017 added flutter valve  - PFT's  08/25/2017  FEV1 0.87 (25 % ) ratio 38  p 4 % improvement from saba p symb/spiriva prior to study   - 01/05/2018  After extensive coaching inhaler device  effectiveness =    75% ( short Ti)      Group D in terms of symptom/risk and laba/lama/ICS  therefore appropriate rx at this point > no change needed

## 2018-01-11 IMAGING — DX DG CHEST 2V
2 series · 2 of 2 positions shown · non-contrast
Comparison: CT 07/31/2012.  Chest x-ray 07/31/2012.

CLINICAL DATA: Chronic shortness of breath.  Chest pain.

EXAM:
CHEST  2 VIEW

[chest pa]
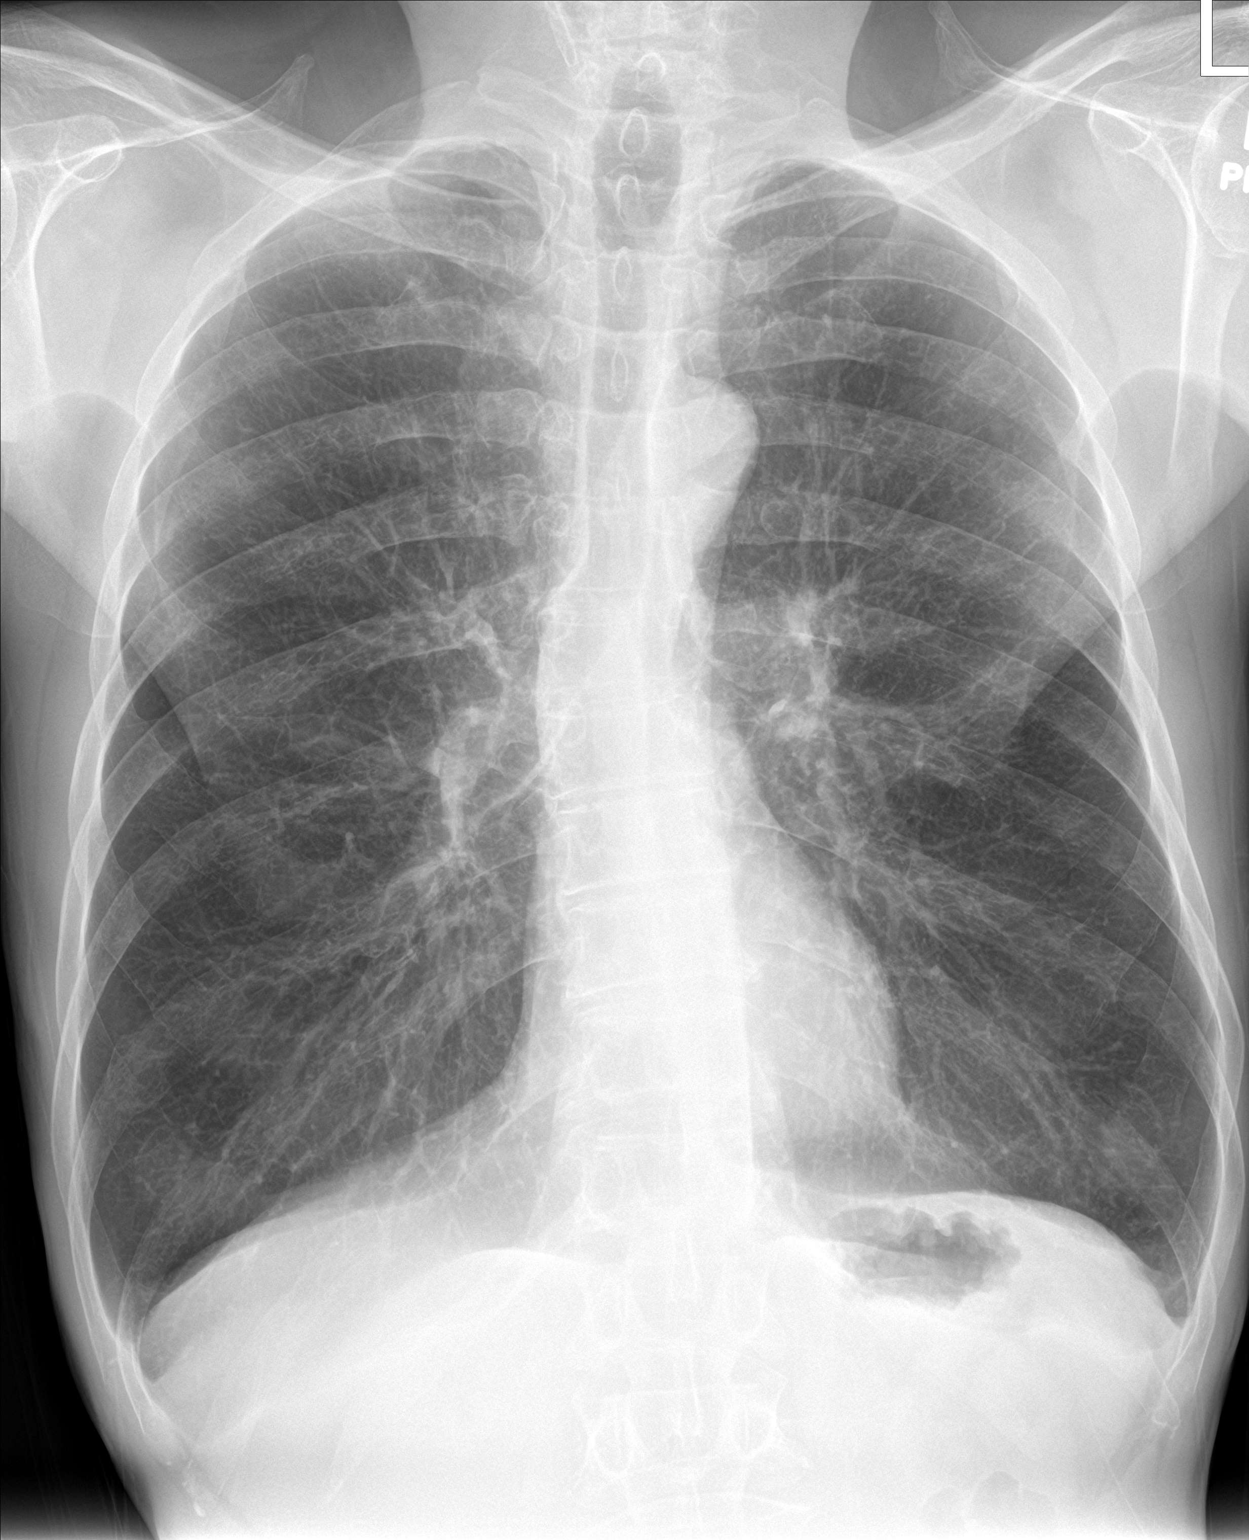

[chest lat]
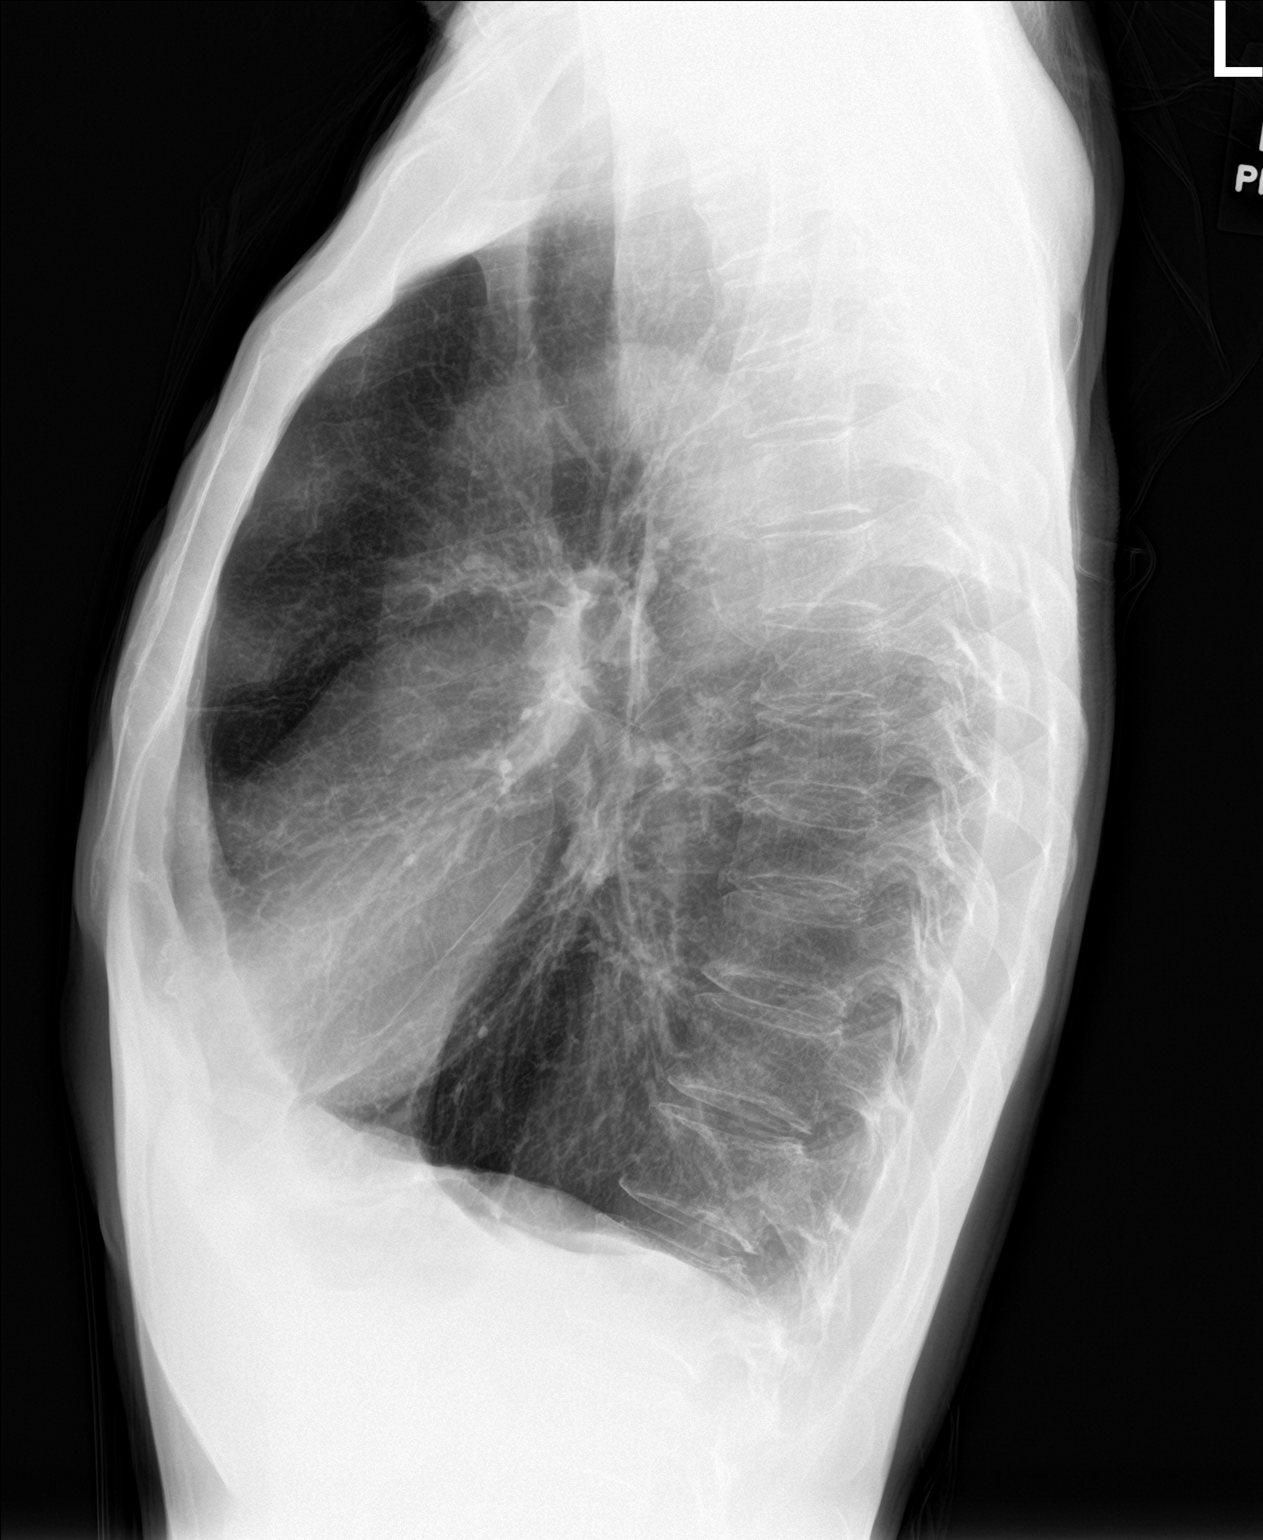

[2 of 2 positions shown; findings below may reference images not displayed]

FINDINGS: Mediastinum and hilar structures normal. COPD. No focal infiltrate.
Stable mild pleural-parenchymal thickening with slight nodularity
noted. No focal mass lesion. No pleural effusion or pneumothorax. No
acute bony abnormality. Diffuse thoracic spine osteopenia
degenerative change with mild old compression fractures .
IMPRESSION: 1. COPD. 2. No acute cardiopulmonary disease. Chest is stable from
prior studies.

## 2018-02-06 ENCOUNTER — Other Ambulatory Visit: Payer: Self-pay | Admitting: Pulmonary Disease

## 2018-02-11 ENCOUNTER — Other Ambulatory Visit: Payer: Self-pay | Admitting: Internal Medicine

## 2018-03-15 ENCOUNTER — Emergency Department (HOSPITAL_COMMUNITY): Payer: Medicaid Other

## 2018-03-15 ENCOUNTER — Emergency Department (HOSPITAL_COMMUNITY)
Admission: EM | Admit: 2018-03-15 | Discharge: 2018-03-15 | Disposition: A | Discharge status: Home / Self Care | Payer: Medicaid Other | Attending: Emergency Medicine | Admitting: Emergency Medicine

## 2018-03-15 ENCOUNTER — Encounter (HOSPITAL_COMMUNITY): Payer: Self-pay | Admitting: Emergency Medicine

## 2018-03-15 ENCOUNTER — Other Ambulatory Visit: Payer: Self-pay

## 2018-03-15 DIAGNOSIS — S2241XA Multiple fractures of ribs, right side, initial encounter for closed fracture: Secondary | ICD-10-CM | POA: Diagnosis not present

## 2018-03-15 DIAGNOSIS — X58XXXA Exposure to other specified factors, initial encounter: Secondary | ICD-10-CM | POA: Insufficient documentation

## 2018-03-15 DIAGNOSIS — S20301A Unspecified superficial injuries of right front wall of thorax, initial encounter: Secondary | ICD-10-CM | POA: Diagnosis present

## 2018-03-15 DIAGNOSIS — Z7982 Long term (current) use of aspirin: Secondary | ICD-10-CM | POA: Diagnosis not present

## 2018-03-15 DIAGNOSIS — Y999 Unspecified external cause status: Secondary | ICD-10-CM | POA: Insufficient documentation

## 2018-03-15 DIAGNOSIS — Y9389 Activity, other specified: Secondary | ICD-10-CM | POA: Diagnosis not present

## 2018-03-15 DIAGNOSIS — Y929 Unspecified place or not applicable: Secondary | ICD-10-CM | POA: Diagnosis not present

## 2018-03-15 DIAGNOSIS — F17210 Nicotine dependence, cigarettes, uncomplicated: Secondary | ICD-10-CM | POA: Insufficient documentation

## 2018-03-15 DIAGNOSIS — J449 Chronic obstructive pulmonary disease, unspecified: Secondary | ICD-10-CM | POA: Diagnosis not present

## 2018-03-15 DIAGNOSIS — R0602 Shortness of breath: Secondary | ICD-10-CM | POA: Insufficient documentation

## 2018-03-15 LAB — CBC WITH DIFFERENTIAL/PLATELET
Basophils Absolute: 0.1 10*3/uL (ref 0.0–0.1)
Basophils Relative: 1 %
EOS ABS: 0.3 10*3/uL (ref 0.0–0.7)
Eosinophils Relative: 3 %
HCT: 42.7 % (ref 39.0–52.0)
HEMOGLOBIN: 14.4 g/dL (ref 13.0–17.0)
LYMPHS ABS: 1.8 10*3/uL (ref 0.7–4.0)
Lymphocytes Relative: 16 %
MCH: 30.8 pg (ref 26.0–34.0)
MCHC: 33.7 g/dL (ref 30.0–36.0)
MCV: 91.2 fL (ref 78.0–100.0)
MONO ABS: 1 10*3/uL (ref 0.1–1.0)
MONOS PCT: 8 %
Neutro Abs: 8.3 10*3/uL — ABNORMAL HIGH (ref 1.7–7.7)
Neutrophils Relative %: 72 %
Platelets: 296 10*3/uL (ref 150–400)
RBC: 4.68 MIL/uL (ref 4.22–5.81)
RDW: 12.4 % (ref 11.5–15.5)
WBC: 11.4 10*3/uL — ABNORMAL HIGH (ref 4.0–10.5)

## 2018-03-15 LAB — COMPREHENSIVE METABOLIC PANEL
ALT: 14 U/L (ref 0–44)
AST: 18 U/L (ref 15–41)
Albumin: 4.1 g/dL (ref 3.5–5.0)
Alkaline Phosphatase: 77 U/L (ref 38–126)
Anion gap: 7 (ref 5–15)
BILIRUBIN TOTAL: 0.7 mg/dL (ref 0.3–1.2)
BUN: 10 mg/dL (ref 6–20)
CALCIUM: 9.1 mg/dL (ref 8.9–10.3)
CO2: 32 mmol/L (ref 22–32)
Chloride: 102 mmol/L (ref 98–111)
Creatinine, Ser: 0.76 mg/dL (ref 0.61–1.24)
GFR calc non Af Amer: >60 mL/min (ref >60)
Glucose, Bld: 86 mg/dL (ref 70–99)
POTASSIUM: 3.9 mmol/L (ref 3.5–5.1)
SODIUM: 141 mmol/L (ref 135–145)
TOTAL PROTEIN: 7.2 g/dL (ref 6.5–8.1)

## 2018-03-15 LAB — I-STAT CG4 LACTIC ACID, ED: LACTIC ACID, VENOUS: 1.32 mmol/L (ref 0.5–1.9)

## 2018-03-15 LAB — TROPONIN I: Troponin I: <0.03 ng/mL (ref <0.03)

## 2018-03-15 MED ORDER — METHYLPREDNISOLONE SODIUM SUCC 125 MG IJ SOLR
125.0000 mg | Freq: Once | INTRAMUSCULAR | Status: AC
  Administered 2018-03-15: 125 mg via INTRAVENOUS
  Filled 2018-03-15: qty 2

## 2018-03-15 MED ORDER — ALBUTEROL SULFATE (2.5 MG/3ML) 0.083% IN NEBU
5.0000 mg | INHALATION_SOLUTION | Freq: Once | RESPIRATORY_TRACT | Status: AC
  Administered 2018-03-15: 5 mg via RESPIRATORY_TRACT

## 2018-03-15 MED ORDER — HYDROCODONE-ACETAMINOPHEN 5-325 MG PO TABS: 1.0000 | ORAL_TABLET | Freq: Four times a day (QID) | ORAL | 0 refills | Status: DC | PRN

## 2018-03-15 MED ORDER — ALBUTEROL (5 MG/ML) CONTINUOUS INHALATION SOLN: 10.0000 mg/h | INHALATION_SOLUTION | RESPIRATORY_TRACT | Status: AC

## 2018-03-15 MED ORDER — ALBUTEROL SULFATE (2.5 MG/3ML) 0.083% IN NEBU
INHALATION_SOLUTION | RESPIRATORY_TRACT | Status: AC
  Filled 2018-03-15: qty 6

## 2018-03-15 NOTE — ED Notes (Addendum)
RT notified. EDP in to assess pt.

## 2018-03-15 NOTE — ED Provider Notes (Signed)
[facility] EMERGENCY DEPARTMENT Provider Note   CSN: [id number] Arrival date & time: 03/15/18  1448     History   Chief Complaint Chief Complaint  Patient presents with  . Shortness of Breath    HPI Tony [name] is a 57 y.o. male.  He has severe COPD and is on home oxygen 2 L 24/7.  He is coming in here today with some right lateral chest wall pain has been going on for a few days.  It is painful to touch.  He is got a chronic cough but has not changed he denies any fevers.  He says his shortness of breath is baseline and it limits him in daily function.  No nausea no vomiting no diarrhea no urinary symptoms.  He only thing he can come up with for why his side hurts him as he said he fell asleep bleeding on something there may be that was a cause.  The history is provided by the patient.  Shortness of Breath  This is a chronic problem. The problem occurs continuously.The problem has not changed since onset.Associated symptoms include cough, wheezing and chest pain (right lateral). Pertinent negatives include no fever, no headaches, no sore throat, no neck pain, no sputum production, no hemoptysis, no syncope, no vomiting, no abdominal pain, no rash, no leg pain and no leg swelling. It is unknown what precipitated the problem. Risk factors include smoking. He has tried nothing for the symptoms. The treatment provided no relief. Associated medical issues include COPD and pneumonia.    Past Medical History:  Diagnosis Date  . Asthma   . Bronchiolitis   . COPD (chronic obstructive pulmonary disease) (HCC)   . Emphysema   . Shortness of breath     Patient Active Problem List   Diagnosis Date Noted  . Chronic respiratory failure with hypoxia (HCC) 07/12/2017  . Poor dentition 07/12/2017  . Alcohol dependence (HCC) 05/29/2017  . Acute respiratory failure with hypoxia (HCC) 05/28/2017  . COPD exacerbation (HCC) 05/26/2017  . Acute on chronic respiratory failure with hypoxia (HCC)  05/26/2017  . CAP (community acquired pneumonia) 05/26/2017  . Cigarette smoker 05/20/2016  . 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 COPD GOLD IV / still smoking  07/31/2012  . Hypokalemia 07/31/2012  . Dyspnea 07/31/2012  . Tobacco abuse 07/31/2012  . Weight loss 07/31/2012  . OPEN FRACTURE DISTAL PHALANX OR PHALANGES HAND 07/06/2007    Past Surgical History:  Procedure Laterality Date  . COLONOSCOPY N/A 08/17/2012   Procedure: COLONOSCOPY;  Surgeon: Corbin Ade, MD;  Location: [facility];  Service: Endoscopy;  Laterality: N/A;  9:30 AM  . CYST EXCISION Right    posterior neck- 57 years old        Home Medications    Prior to Admission medications   Medication Sig Start Date End Date Taking? Authorizing Provider  albuterol (PROVENTIL HFA;VENTOLIN HFA) 108 (90 Base) MCG/ACT inhaler Inhale 2 puffs into the lungs every 6 (six) hours as needed. Shortness Of Breath 06/06/17   Mannam, Colbert Coyer, MD  aspirin EC 81 MG tablet Take 81 mg by mouth daily.    [provider]  budesonide-formoterol (SYMBICORT) 160-4.5 MCG/ACT inhaler Inhale 2 puffs into the lungs 2 (two) times daily. 06/06/17   Mannam, Colbert Coyer, MD  ipratropium-albuterol (DUONEB) 0.5-2.5 (3) MG/3ML SOLN Take 3 mLs by nebulization every 4 (four) hours as needed (shortness of breath, wheezing). 08/25/17   Nyoka Cowden, MD  OXYGEN 2lpm as needed per pt    [provider]  Respiratory Therapy Supplies (FLUTTER) DEVI 1 Device by Does not apply route as directed. 07/11/17   Nyoka CowdenWert, Michael B, MD  SPIRIVA RESPIMAT 2.5 MCG/ACT AERS INHALE 2 PUFFS INTO THE LUNGS ONCE DAILY 02/06/18   Nyoka CowdenWert, Michael B, MD  SYMBICORT 160-4.5 MCG/ACT inhaler INHALE 2 PUFFS BY MOUTH TWICE DAILY 02/11/18   Nyoka CowdenWert, Michael B, MD    Family History Family History  Problem Relation Age of Onset  . Cancer Mother   . COPD Brother     Social History Social History   Tobacco Use  . Smoking status: Current  Every Day Smoker    Packs/day: 0.50    Years: 40.00    Pack years: 20.00    Types: Cigarettes  . Smokeless tobacco: Never Used  . Tobacco comment: currently smoking 2-3 cigs per day as of 07/11/17  ep  Substance Use Topics  . Alcohol use: No  . Drug use: No     Allergies   Bee venom   Review of Systems Review of Systems  Constitutional: Negative for fever.  HENT: Negative for sore throat.   Eyes: Negative for visual disturbance.  Respiratory: Positive for cough, shortness of breath and wheezing. Negative for hemoptysis and sputum production.   Cardiovascular: Positive for chest pain (right lateral). Negative for leg swelling and syncope.  Gastrointestinal: Negative for abdominal pain and vomiting.  Genitourinary: Negative for dysuria.  Musculoskeletal: Negative for back pain and neck pain.  Skin: Negative for rash.  Neurological: Negative for headaches.     Physical Exam Updated Vital Signs BP 119/80   Pulse 61   Resp 14   Ht 5\' 6"  (1.676 m)   Wt 49.9 kg   SpO2 100%   BMI 17.75 kg/m   Physical Exam  Constitutional: He appears cachectic.  HENT:  Head: Normocephalic and atraumatic.  Right Ear: External ear normal.  Left Ear: External ear normal.  Nose: Nose normal.  Mouth/Throat: Oropharynx is clear and moist.  Eyes: Pupils are equal, round, and reactive to light. EOM are normal.  Neck: Normal range of motion.  Cardiovascular: Normal rate, regular rhythm and normal heart sounds.  Pulmonary/Chest: Accessory muscle usage present. Tachypnea noted. He has decreased breath sounds. He has wheezes (scattered). He exhibits tenderness.  Tenderness right lateral lower ribs.  Abdominal: Soft. He exhibits no mass. There is no tenderness. There is no guarding.  Musculoskeletal: Normal range of motion. He exhibits no tenderness or deformity.  Neurological: He is alert.  Skin: Skin is warm. He is diaphoretic.     ED Treatments / Results  Labs (all labs ordered are  listed, but only abnormal results are displayed) Labs Reviewed  CBC WITH DIFFERENTIAL/PLATELET - Abnormal; Notable for the following components:      Result Value   WBC 11.4 (*)    Neutro Abs 8.3 (*)    All other components within normal limits  CULTURE, BLOOD (ROUTINE X 2)  CULTURE, BLOOD (ROUTINE X 2)  COMPREHENSIVE METABOLIC PANEL  TROPONIN I  I-STAT CG4 LACTIC ACID, ED    EKG EKG Interpretation  Date/Time:  Sunday March 15 2018 14:57:35 EDT Ventricular Rate:  72 PR Interval:    QRS Duration: 85 QT Interval:  369 QTC Calculation: 404 R Axis:   -94 Text Interpretation:  Sinus rhythm Right superior axis Baseline wander in lead(s) V6 similar pattern to prior 12/18 Confirmed by Meridee ScoreButler, Michael [phone]([id number]) on 03/15/2018 3:00:58 PM  Radiology Dg Ribs Unilateral W/chest Right  Result Date: 03/15/2018 CLINICAL DATA:  Acute RIGHT chest and rib pain 3 following injury. Initial encounter. EXAM: RIGHT RIBS AND CHEST - 3+ VIEW COMPARISON:  03/15/2018 and prior radiographs FINDINGS: Subtle nondisplaced acute fractures of the RIGHT 8th and 9th ribs noted. No other significant abnormalities noted. IMPRESSION: Nondisplaced fractures of the RIGHT 8th and 9th ribs. Electronically Signed   By: Harmon Pier M.D.   On: 03/15/2018 16:17   Dg Chest Port 1 View  Result Date: 03/15/2018 CLINICAL DATA:  Shortness of breath and chest pain. EXAM: PORTABLE CHEST 1 VIEW COMPARISON:  08/25/2017 FINDINGS: 1459 hours. Lungs are hyperexpanded. Interstitial markings are diffusely coarsened with chronic features. The lungs are clear without focal pneumonia, edema, pneumothorax or pleural effusion. The cardiopericardial silhouette is within normal limits for size. The visualized bony structures of the thorax are intact. Telemetry leads overlie the chest. IMPRESSION: 1. Emphysema without acute cardiopulmonary findings. Electronically Signed   By: Kennith Center M.D.   On: 03/15/2018 15:18    Procedures Procedures  (including critical care time)  Medications Ordered in ED Medications  albuterol (PROVENTIL,VENTOLIN) solution continuous neb (has no administration in time range)  methylPREDNISolone sodium succinate (SOLU-MEDROL) 125 mg/2 mL injection 125 mg (has no administration in time range)  albuterol (PROVENTIL) (2.5 MG/3ML) 0.083% nebulizer solution (  Hold 03/15/18 1500)  albuterol (PROVENTIL) (2.5 MG/3ML) 0.083% nebulizer solution 5 mg (5 mg Nebulization Given 03/15/18 1500)     Initial Impression / Assessment and Plan / ED Course  I have reviewed the triage vital signs and the nursing notes.  Pertinent labs & imaging results that were available during my care of the patient were reviewed by me and considered in my medical decision making (see chart for details).  Clinical Course as of Mar 16 2131  Sun Mar 15, 2018  [id number] 57 year old male with significant COPD here with right-sided chest wall pain and becoming acutely more tachypneic while walking into the department.  He is received a neb and he looks much more comfortable and his retractions have improved.  His initial portable chest x-ray is negative we will order rib series now.   [MB]  [id number] Patient feels better and would like to be discharged.  A followed up with him regarding his lab work and he has a chest x-ray that shows 2 rib fractures on the right.  This is likely the cause of his pain.  He is asking for something for pain and has a plan to go home and start cooking some steaks   [MB]    Clinical Course User Index [MB] Terrilee Files, MD     Final Clinical Impressions(s) / ED Diagnoses   Final diagnoses:  Closed fracture of multiple ribs of right side, initial encounter  SOB (shortness of breath)    ED Discharge Orders         Ordered    HYDROcodone-acetaminophen (NORCO/VICODIN) 5-325 MG tablet  Every 6 hours PRN     03/15/18 1638           Terrilee Files, MD 03/15/18 2133

## 2018-03-15 NOTE — ED Provider Notes (Signed)
MSE was initiated and I personally evaluated the patient and placed orders (if any) at  2:55 PM on March 15, 2018.  The patient appears stable so that the remainder of the MSE may be completed by another provider.  The patient endorses having a prior history of severe COPD on chronic oxygen, presents today with shortness of breath, has increasing right-sided chest pain on my exam has decreased breath sounds bilaterally with increased work of breathing, speaks in very short and sentences, is slightly diaphoretic to the touch.  Will need formal evaluation and exam with chest x-ray labs EKG, may be septic, consider pneumothorax, pneumonia, pulmonary embolism seems less likely given the rest of his pulmonary exam.  The rest of the exam will be completed by another provider.  Orders have been entered   Eber HongMiller, Amiayah Giebel, MD 03/15/18 1455

## 2018-03-15 NOTE — ED Triage Notes (Signed)
Pt began having R sided rib pain and SOB 4 days ago. Denies fall or injury. States he fell asleep in a chair with a wooden arm and could have injured it that way. No nebulizer or inhaler use today. Wears 2L [place] at home. Hx of COPD and emphysema.

## 2018-03-15 NOTE — Discharge Instructions (Addendum)
You were evaluated in the emergency department for right-sided chest pain.  You received some breathing treatments here because of your shortness of breath.  You had blood work and a chest x-ray which showed that you had 2 rib fractures on the right.  These will likely need to heal on their own and you will be at increased risk for complications because of your COPD.  We are prescribing you some pain pills but will be important for you to follow-up with your doctor and return if any worsening symptoms.

## 2018-03-20 LAB — CULTURE, BLOOD (ROUTINE X 2)
Culture: NO GROWTH
Culture: NO GROWTH
Special Requests: ADEQUATE
Special Requests: ADEQUATE

## 2018-05-29 ENCOUNTER — Emergency Department (HOSPITAL_COMMUNITY): Payer: Medicaid Other

## 2018-05-29 ENCOUNTER — Emergency Department (HOSPITAL_COMMUNITY)
Admission: EM | Admit: 2018-05-29 | Discharge: 2018-05-29 | Disposition: A | Discharge status: Home / Self Care | Payer: Medicaid Other | Attending: Emergency Medicine | Admitting: Emergency Medicine

## 2018-05-29 ENCOUNTER — Other Ambulatory Visit: Payer: Self-pay

## 2018-05-29 ENCOUNTER — Encounter (HOSPITAL_COMMUNITY): Payer: Self-pay | Admitting: Emergency Medicine

## 2018-05-29 DIAGNOSIS — F17210 Nicotine dependence, cigarettes, uncomplicated: Secondary | ICD-10-CM | POA: Insufficient documentation

## 2018-05-29 DIAGNOSIS — Z7982 Long term (current) use of aspirin: Secondary | ICD-10-CM | POA: Diagnosis not present

## 2018-05-29 DIAGNOSIS — J441 Chronic obstructive pulmonary disease with (acute) exacerbation: Secondary | ICD-10-CM | POA: Insufficient documentation

## 2018-05-29 DIAGNOSIS — R0602 Shortness of breath: Secondary | ICD-10-CM | POA: Diagnosis present

## 2018-05-29 DIAGNOSIS — Z79899 Other long term (current) drug therapy: Secondary | ICD-10-CM | POA: Insufficient documentation

## 2018-05-29 LAB — CBC WITH DIFFERENTIAL/PLATELET
Abs Immature Granulocytes: 0.04 10*3/uL (ref 0.00–0.07)
Basophils Absolute: 0.1 10*3/uL (ref 0.0–0.1)
Basophils Relative: 1 %
EOS PCT: 2 %
Eosinophils Absolute: 0.2 10*3/uL (ref 0.0–0.5)
HCT: 43 % (ref 39.0–52.0)
Hemoglobin: 13.6 g/dL (ref 13.0–17.0)
Immature Granulocytes: 0 %
Lymphocytes Relative: 7 %
Lymphs Abs: 0.8 10*3/uL (ref 0.7–4.0)
MCH: 29.4 pg (ref 26.0–34.0)
MCHC: 31.6 g/dL (ref 30.0–36.0)
MCV: 92.9 fL (ref 80.0–100.0)
Monocytes Absolute: 1.1 10*3/uL — ABNORMAL HIGH (ref 0.1–1.0)
Monocytes Relative: 10 %
Neutro Abs: 9.3 10*3/uL — ABNORMAL HIGH (ref 1.7–7.7)
Neutrophils Relative %: 80 %
Platelets: 260 10*3/uL (ref 150–400)
RBC: 4.63 MIL/uL (ref 4.22–5.81)
RDW: 12.8 % (ref 11.5–15.5)
WBC: 11.4 10*3/uL — ABNORMAL HIGH (ref 4.0–10.5)
nRBC: 0 % (ref 0.0–0.2)

## 2018-05-29 LAB — COMPREHENSIVE METABOLIC PANEL
ALT: 17 U/L (ref 0–44)
AST: 22 U/L (ref 15–41)
Albumin: 3.7 g/dL (ref 3.5–5.0)
Alkaline Phosphatase: 59 U/L (ref 38–126)
Anion gap: 10 (ref 5–15)
BUN: 5 mg/dL — ABNORMAL LOW (ref 6–20)
CO2: 28 mmol/L (ref 22–32)
Calcium: 8.8 mg/dL — ABNORMAL LOW (ref 8.9–10.3)
Chloride: 99 mmol/L (ref 98–111)
Creatinine, Ser: 0.81 mg/dL (ref 0.61–1.24)
GFR calc Af Amer: >60 mL/min (ref >60)
GFR calc non Af Amer: >60 mL/min (ref >60)
Glucose, Bld: 191 mg/dL — ABNORMAL HIGH (ref 70–99)
Potassium: 3.5 mmol/L (ref 3.5–5.1)
Sodium: 137 mmol/L (ref 135–145)
TOTAL PROTEIN: 6.6 g/dL (ref 6.5–8.1)
Total Bilirubin: 0.9 mg/dL (ref 0.3–1.2)

## 2018-05-29 LAB — TROPONIN I: Troponin I: <0.03 ng/mL (ref <0.03)

## 2018-05-29 LAB — AMMONIA: Ammonia: 30 umol/L (ref 9–35)

## 2018-05-29 LAB — D-DIMER, QUANTITATIVE: D-Dimer, Quant: <0.27 ug/mL-FEU (ref 0.00–0.50)

## 2018-05-29 MED ORDER — DOXYCYCLINE HYCLATE 100 MG PO CAPS: 100.0000 mg | ORAL_CAPSULE | Freq: Two times a day (BID) | ORAL | 0 refills | Status: DC

## 2018-05-29 MED ORDER — DEXAMETHASONE 4 MG PO TABS: 4.0000 mg | ORAL_TABLET | Freq: Two times a day (BID) | ORAL | 0 refills | Status: DC

## 2018-05-29 MED ORDER — IPRATROPIUM BROMIDE 0.02 % IN SOLN: 0.5000 mg | Freq: Once | RESPIRATORY_TRACT | Status: DC

## 2018-05-29 MED ORDER — ALBUTEROL SULFATE (2.5 MG/3ML) 0.083% IN NEBU: 5.0000 mg | INHALATION_SOLUTION | Freq: Once | RESPIRATORY_TRACT | Status: DC

## 2018-05-29 MED ORDER — ALBUTEROL SULFATE (2.5 MG/3ML) 0.083% IN NEBU
2.5000 mg | INHALATION_SOLUTION | Freq: Once | RESPIRATORY_TRACT | Status: AC
  Administered 2018-05-29: 2.5 mg via RESPIRATORY_TRACT
  Filled 2018-05-29: qty 3

## 2018-05-29 MED ORDER — DEXAMETHASONE SODIUM PHOSPHATE 10 MG/ML IJ SOLN
10.0000 mg | Freq: Once | INTRAMUSCULAR | Status: AC
  Administered 2018-05-29: 10 mg via INTRAVENOUS
  Filled 2018-05-29: qty 1

## 2018-05-29 MED ORDER — IPRATROPIUM-ALBUTEROL 0.5-2.5 (3) MG/3ML IN SOLN
3.0000 mL | Freq: Once | RESPIRATORY_TRACT | Status: AC
  Administered 2018-05-29: 3 mL via RESPIRATORY_TRACT
  Filled 2018-05-29: qty 3

## 2018-05-29 NOTE — ED Provider Notes (Signed)
[facility] EMERGENCY DEPARTMENT Provider Note   CSN: [id number] Arrival date & time: 05/29/18  0813     History   Chief Complaint Chief Complaint  Patient presents with  . Shortness of Breath    HPI Astrid DraftsFrank A Mcaulay is a 57 y.o. male.  Patient is a 57 year old male who presents to the emergency department with complaint of shortness of breath.  The patient has a history of chronic respiratory failure with hypoxia, alcohol dependence, COPD exacerbation.  The patient states that within the last few days he has been having increasing problems with shortness of breath.  He says he has easy fatigue just going to short distances within his house and outside his home.  He has not had temperature elevation, however family says one day he did feel a little bit warm to touch.  The patient denies hemoptysis.  He says he has been coughing up "white stuff".  The patient denies any chest pain.  Only problem with shortness of breath is reported at this time.  The patient also complains of his right leg feeling cold frequently, particularly at night.  He says the leg feels cold from the knee down to the foot.  Says this is been going on for quite some time.  He has spoken with his primary physicians about it, but he says that he wanted to get it checked while he was here.  The history is provided by the patient.  Shortness of Breath  Associated symptoms include cough. Pertinent negatives include no neck pain, no wheezing, no chest pain and no abdominal pain.    Past Medical History:  Diagnosis Date  . Asthma   . Bronchiolitis   . COPD (chronic obstructive pulmonary disease) (HCC)   . Emphysema   . Shortness of breath     Patient Active Problem List   Diagnosis Date Noted  . Chronic respiratory failure with hypoxia (HCC) 07/12/2017  . Poor dentition 07/12/2017  . Alcohol dependence (HCC) 05/29/2017  . Acute respiratory failure with hypoxia (HCC) 05/28/2017  . COPD exacerbation (HCC)  05/26/2017  . Acute on chronic respiratory failure with hypoxia (HCC) 05/26/2017  . CAP (community acquired pneumonia) 05/26/2017  . Cigarette smoker 05/20/2016  . 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 COPD GOLD IV / still smoking  07/31/2012  . Hypokalemia 07/31/2012  . Dyspnea 07/31/2012  . Tobacco abuse 07/31/2012  . Weight loss 07/31/2012  . OPEN FRACTURE DISTAL PHALANX OR PHALANGES HAND 07/06/2007    Past Surgical History:  Procedure Laterality Date  . COLONOSCOPY N/A 08/17/2012   Procedure: COLONOSCOPY;  Surgeon: Corbin Adeobert M Rourk, MD;  Location: [facility];  Service: Endoscopy;  Laterality: N/A;  9:30 AM  . CYST EXCISION Right    posterior neck- 57 years old        Home Medications    Prior to Admission medications   Medication Sig Start Date End Date Taking? Authorizing Provider  albuterol (PROVENTIL HFA;VENTOLIN HFA) 108 (90 Base) MCG/ACT inhaler Inhale 2 puffs into the lungs every 6 (six) hours as needed. Shortness Of Breath 06/06/17  Yes Mannam, Praveen, MD  aspirin EC 81 MG tablet Take 81 mg by mouth daily.   Yes [provider]  ipratropium-albuterol (DUONEB) 0.5-2.5 (3) MG/3ML SOLN Take 3 mLs by nebulization every 4 (four) hours as needed (shortness of breath, wheezing). 08/25/17  Yes Nyoka CowdenWert, Michael B, MD  SPIRIVA RESPIMAT 2.5 MCG/ACT AERS INHALE 2 PUFFS INTO THE LUNGS ONCE  DAILY Patient taking differently: Inhale 2 puffs into the lungs daily.  02/06/18  Yes Nyoka Cowden, MD  SYMBICORT 160-4.5 MCG/ACT inhaler INHALE 2 PUFFS BY MOUTH TWICE DAILY Patient taking differently: Inhale 2 puffs into the lungs 2 (two) times daily.  02/11/18  Yes Nyoka Cowden, MD  trimethoprim-polymyxin b (POLYTRIM) ophthalmic solution Place 2 drops into the right eye every 6 (six) hours.  04/13/18  Yes [provider]  HYDROcodone-acetaminophen (NORCO/VICODIN) 5-325 MG tablet Take 1-2 tablets by mouth every 6 (six) hours as needed for  severe pain. Patient not taking: Reported on 05/29/2018 03/15/18   Terrilee Files, MD  Respiratory Therapy Supplies (FLUTTER) DEVI 1 Device by Does not apply route as directed. Patient not taking: Reported on 05/29/2018 07/11/17   Nyoka Cowden, MD    Family History Family History  Problem Relation Age of Onset  . Cancer Mother   . COPD Brother     Social History Social History   Tobacco Use  . Smoking status: Current Every Day Smoker    Packs/day: 0.50    Years: 40.00    Pack years: 20.00    Types: Cigarettes  . Smokeless tobacco: Never Used  . Tobacco comment: currently smoking 2-3 cigs per day as of 07/11/17  ep  Substance Use Topics  . Alcohol use: No  . Drug use: No     Allergies   Bee venom   Review of Systems Review of Systems  Constitutional: Negative for activity change.       All ROS Neg except as noted in HPI  HENT: Negative for nosebleeds.   Eyes: Negative for photophobia and discharge.  Respiratory: Positive for cough and shortness of breath. Negative for wheezing.   Cardiovascular: Negative for chest pain and palpitations.  Gastrointestinal: Negative for abdominal pain and blood in stool.  Genitourinary: Negative for dysuria, frequency and hematuria.  Musculoskeletal: Positive for arthralgias. Negative for back pain and neck pain.  Skin: Negative.   Neurological: Negative for dizziness, seizures and speech difficulty.  Psychiatric/Behavioral: Negative for confusion and hallucinations.     Physical Exam Updated Vital Signs BP (!) 153/94 (BP Location: Left Arm)   Pulse 90   Temp 97.8 F (36.6 C) (Oral)   Resp (!) 24   Ht 5\' 8"  (1.727 m)   Wt 49.9 kg   SpO2 100%   BMI 16.73 kg/m   Physical Exam  Constitutional: He is oriented to person, place, and time. He appears well-developed and well-nourished.  Non-toxic appearance.  HENT:  Head: Normocephalic.  Right Ear: Tympanic membrane and external ear normal.  Left Ear: Tympanic membrane and  external ear normal.  Eyes: Pupils are equal, round, and reactive to light. EOM and lids are normal.  Neck: Normal range of motion. Neck supple. Carotid bruit is not present.  Cardiovascular: Normal rate, regular rhythm, normal heart sounds, intact distal pulses and normal pulses.  Pulmonary/Chest: Accessory muscle usage present. Tachypnea noted. No respiratory distress. He has wheezes. He has rhonchi.  Abdominal: Soft. Bowel sounds are normal. There is no tenderness. There is no guarding.  Musculoskeletal: Normal range of motion.       Right lower leg: He exhibits no tenderness and no edema.       Left lower leg: He exhibits no edema.  Lymphadenopathy:       Head (right side): No submandibular adenopathy present.       Head (left side): No submandibular adenopathy present.    He has  no cervical adenopathy.  Neurological: He is alert and oriented to person, place, and time. He has normal strength. No cranial nerve deficit or sensory deficit.  Skin: Skin is warm and dry.  Psychiatric: He has a normal mood and affect. His speech is normal.  Nursing note and vitals reviewed.    ED Treatments / Results  Labs (all labs ordered are listed, but only abnormal results are displayed) Labs Reviewed  COMPREHENSIVE METABOLIC PANEL - Abnormal; Notable for the following components:      Result Value   Glucose, Bld 191 (*)    BUN 5 (*)    Calcium 8.8 (*)    All other components within normal limits  CBC WITH DIFFERENTIAL/PLATELET - Abnormal; Notable for the following components:   WBC 11.4 (*)    Neutro Abs 9.3 (*)    Monocytes Absolute 1.1 (*)    All other components within normal limits  TROPONIN I  D-DIMER, QUANTITATIVE (NOT AT [facility])  AMMONIA    EKG EKG Interpretation  Date/Time:  Friday May 29 2018 09:03:57 EST Ventricular Rate:  71 PR Interval:    QRS Duration: 106 QT Interval:  387 QTC Calculation: 421 R Axis:   0 Text Interpretation:  Sinus rhythm Borderline short PR  interval Borderline low voltage, extremity leads When compared with ECG of 03/15/2018 No significant change was found Confirmed by Samuel Jester [phone]) on 05/29/2018 9:29:23 AM   Radiology Dg Chest 2 View  Result Date: 05/29/2018 CLINICAL DATA:  Increased shortness of breath. History of COPD, asthma, smoking, previous episodes of pneumonia. EXAM: CHEST - 2 VIEW COMPARISON:  Portable chest x-ray of March 15, 2018 FINDINGS: The lungs remain hyperinflated with hemidiaphragm flattening. There is no pneumothorax, pneumomediastinum, or pleural effusion. There is no alveolar pneumonia. The heart and mediastinal structures are normal. The bony thorax exhibits no acute abnormalities. Old deformity of the lateral aspects of the right eighth and ninth ribs is observed. IMPRESSION: COPD-reactive airway disease.  No acute cardiopulmonary abnormality. Electronically Signed   By: David  [place] M.D.   On: 05/29/2018 09:55    Procedures Procedures (including critical care time)  Medications Ordered in ED Medications  dexamethasone (DECADRON) injection 10 mg (10 mg Intravenous Given 05/29/18 0901)  ipratropium-albuterol (DUONEB) 0.5-2.5 (3) MG/3ML nebulizer solution 3 mL (3 mLs Nebulization Given 05/29/18 0852)  albuterol (PROVENTIL) (2.5 MG/3ML) 0.083% nebulizer solution 2.5 mg (2.5 mg Nebulization Given 05/29/18 0852)     Initial Impression / Assessment and Plan / ED Course  I have reviewed the triage vital signs and the nursing notes.  Pertinent labs & imaging results that were available during my care of the patient were reviewed by me and considered in my medical decision making (see chart for details).       Final Clinical Impressions(s) / ED Diagnoses MDM  Patient has wheezing and use of accessory muscles plan admission to the emergency department, and my initial evaluation.  Patient was treated with nebulizer albuterol and Atrovent.  He was given steroid medication.  The patient tolerated  the treatment without problem, recheck however shows that the patient is still working aggressively to breathe and continues to have wheezing.  Another nebulizer treatment was given to the patient.  Recheck.  Patient states he feels tremendously better.  He says that his right leg now feels warm and is not bothering him. The comprehensive metabolic panel is nonacute.  Doubt potassium is being a contributor to the patient's difficulty with his breathing.  The troponin  is less than 0.03, and the electrocardiogram is negative for acute STEMI or life-threatening arrhythmia.  Doubt cardiac source as cause of the shortness of breath and dyspnea.  D-dimer is less than 0.27.  Doubt acute PE as contributor to the patient's dyspnea or shortness of breath.  The chest x-ray shows chronic obstructive pulmonary disease and reactive airway disease.  There is no acute cardiopulmonary abnormality appreciated.  No pleural effusion appreciated.  Patient states he continues to feel improved.  He feels that he can handle the condition at home with his home medications at this time. Patient continues to smoke.  He will be covered with doxycycline for possible atypical organisms.  The patient will add Decadron to his current medications.  He will follow-up with his primary physician and his pulmonary specialist.  Patient is in agreement with this plan.   Final diagnoses:  COPD exacerbation [facility])    ED Discharge Orders         Ordered    doxycycline (VIBRAMYCIN) 100 MG capsule  2 times daily     05/29/18 1113    dexamethasone (DECADRON) 4 MG tablet  2 times daily with meals     05/29/18 1113           Ivery Quale, PA-C 05/30/18 2105    Samuel Jester, DO 05/31/18 [phone]

## 2018-05-29 NOTE — Discharge Instructions (Addendum)
Your chest x-ray is negative for pneumonia or other acute problems.  Your heart enzyme is negative for acute heart event, and your blood test is negative for acute blood clot.  Your examination favors an exacerbation of your chronic lung disease, and probably some bronchitis.  Please continue to use your albuterol every 4 hours.  Use the Decadron that has been ordered today daily.  Use doxycycline 2 times daily with food.  Please see Dr. Dimas AguasHoward, as well as your lung specialist for follow-up of this issue.  Return to the emergency department if any emergent changes in your condition, problems, or concerns.

## 2018-05-29 NOTE — ED Triage Notes (Signed)
Pt states that he has been sob for a couple of days pt gets very short of breath with minual activity he is coughing up white stuff he states that his right leg is cold from the knee down

## 2018-05-29 NOTE — ED Notes (Signed)
Patient transported to X-ray 

## 2018-06-25 ENCOUNTER — Other Ambulatory Visit: Payer: Self-pay | Admitting: Internal Medicine

## 2018-07-08 ENCOUNTER — Encounter: Payer: Self-pay | Admitting: Internal Medicine

## 2018-07-08 ENCOUNTER — Ambulatory Visit (INDEPENDENT_AMBULATORY_CARE_PROVIDER_SITE_OTHER): Payer: Medicaid Other | Admitting: Internal Medicine

## 2018-07-08 VITALS — BP 100/58 | HR 85 | Ht 68.0 in | Wt 110.4 lb

## 2018-07-08 DIAGNOSIS — F17210 Nicotine dependence, cigarettes, uncomplicated: Secondary | ICD-10-CM

## 2018-07-08 DIAGNOSIS — J449 Chronic obstructive pulmonary disease, unspecified: Secondary | ICD-10-CM

## 2018-07-08 DIAGNOSIS — J9611 Chronic respiratory failure with hypoxia: Secondary | ICD-10-CM

## 2018-07-08 MED ORDER — ALBUTEROL SULFATE (2.5 MG/3ML) 0.083% IN NEBU: 2.5000 mg | INHALATION_SOLUTION | RESPIRATORY_TRACT | 12 refills | Status: DC | PRN

## 2018-07-08 NOTE — Patient Instructions (Addendum)
Plan A = Automatic = symbiocort and spiriva as you are  Work on inhaler technique:  relax and gently blow all the way out then take a nice smooth deep breath back in, triggering the inhaler at same time you start breathing in.  Hold for up to 5 seconds if you can. Blow symbicort out thru nose. Rinse and gargle with water when done     Plan B = Backup Only use your albuterol inhaler(Proventil)  as a rescue medication to be used if you can't catch your breath by resting or doing a relaxed purse lip breathing pattern.  - The less you use it, the better it will work when you need it. - Ok to use the inhaler up to 2 puffs  every 4 hours if you must but call for appointment if use goes up over your usual need - Don't leave home without it !!  (think of it like the spare tire for your car)    Plan C = Crisis - only use your albuterol nebulizer if you first try Plan B and it fails to help > ok to use the nebulizer up to every 4 hours but if start needing it regularly call for immediate appointment   Please schedule a follow up visit in 6 months but call sooner if needed

## 2018-07-08 NOTE — Progress Notes (Signed)
Subjective:     Patient ID: Tony [name], male   DOB: 1960/08/18     MRN: [id number]    Brief patient profile:  [age] yowm active smoker dx copd  2014 with onset of doe in 2010 and maint on advair/ albuterol referred to pulmonary clinic 05/20/2016 by [facility] by Kizzie Furnishochelle Muse, NP with GOLD IV criteria 05/20/16 and MS phenotype with nl alpha one AT levels documented 07/08/16       History of Present Illness  05/20/2016 1st [facility] office visit/ Wert  maint rx advair 250 bid/ neb alb tid and saba hfa prn  Chief Complaint  Patient presents with  . Pulmonary Consult    Referred by Kizzie Furnishochelle Muse, NP. Pt c/o SOB since 2014, worse "for a while".   He gets winded just walking short distances such as walking to his back yard. He also c/o left side pain. He also c/o cough- prod with thick, clear to white sputum.    doe x MMRC3 = can't walk 100 yards even at a slow pace at a flat grade s stopping due to sob  Without much fluctuation  Has bilateral L CP  24/7 x 3 years but worse with coughing and when lying on L side  rec Plan A = Automatic = Symbicort 160 Take 2 puffs first thing in am and then another 2 puffs about 12 hours later.  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 He complains of worsen nonproductive cough without fever, chills, hemoptysis. Initial CXR showed atelectasis vs early interstitial pneumonia. The patient was started on IV solumedrol, Duonebs and antibiotics.    Discharge Diagnoses:  Acute respiratory failure with hypoxia -secondary to COPD exacerbation -presently stable on2L [place] -very little  functional reserve--dyspneic with minimal exertion -ambulatory pulse ox showed desaturation on RA -set up home oxygen--2L  COPD Exacerbation -continueDuoNebs q 4 hours during the hospitalzation -d/c spiriva -continue solu-medrol>>>prednisone taper over 2 weeks after d/c -continuepulmicort -continue ceftriaxone>>home with levofloxacin 2 days  -do not feel pt has pneumonia--urine strep pneumoniae antigen nonspecific -slow improvementeach day  Tobacco abuse -cessation discussed -nicotine patch  Poor Health Literacy -pt cannot read or write  Alcohol dependence -drinks up to 6 beers daily -CIWA -no signs of withdrawal     07/11/2017  f/u ov/Wert re:  Transition of care s/p aeCopd /newly on 02 2lpm 25/7  poor hfa and still smoking Chief Complaint  Patient presents with  . Follow-up    Pt states he has good days and bad days. On 07/09/17 and 07/10/17, pt states his breathing was a lot worse with the weather change. Breathing has been doing good today. Pt does have occ coughing but is unable to get any mucus up. Denies any CP.   overall about the same [facility] can't walk 100 yards even at a slow pace at a flat grade s stopping due to sob  Even on 02/ has difficulty with chest congestion/ rattling but what mucus he does bring up is mucoid, thick, not purulent worse in am's  but able to sleep ok on 2 pillows  No obvious patterns day to day or daytime variability or assoc   mucus plugs or hemoptysis or cp or chest tightness, subjective wheeze or overt sinus or hb symptoms. No unusual exposure hx or h/o childhood pna/ asthma or knowledge of premature birth.  Sleeping ok ok 2lpm on 2 pillows without nocturnal   exacerbation  of respiratory  c/o's or need for noct saba. Also denies any obvious fluctuation of symptoms with weather or environmental changes or other aggravating or alleviating factors except as outlined above  rec For cough > mucinex 1200 mg every 12  hour and flutter valve as much as possible The key is to stop smoking completely before smoking completely stops you!  Prevnar 13 and flu vaccines today  See your dentist asap  Plan A = Automatic = symbicort/ spiriva daily as you are  Plan B = Backup Only use your albuterol (proair) as a rescue medication  Plan C = Crisis - only use your albuterol nebulizer if you first try Plan B and it fails to help > ok to use the nebulizer up to every 4 hours but if start needing it regularly call for immediate appointment        01/05/2018  f/u ov/Wert re: copd gold iv/ 02 dep / still smoking on symb / spiriva smi  Chief Complaint  Patient presents with  . Follow-up    Breathing is overall doing well. He is using his albuterol inhaler 2-3 x per day and neb about once per day.   Dyspnea:  [employer] one ailse on 02 = MMRC3 = can't walk 100 yards even at a slow pace at a flat grade s stopping due to sob   Cough:  Better/ no am flare  SABA use: as above/ mostly when goes out in heat and over does it 02: 2lpm 24/7   rec The key is to stop smoking completely before smoking completely stops you!  Work on inhaler technique   07/08/2018  f/u ov/Wert re: GOLD IV/ 02 dep but poor insight into how to use it /maint spiriva/ symbicort  Chief Complaint  Patient presents with  . Follow-up    Pt states his breathing is bad today b/c he is in our office and he feels hot. He is using his albuterol inhaler and neb both about once per wk "depends on the weather".   Dyspnea: 50 ft with or without 02 has to stop  Cough: rattling first thing am  Sleeping: flat in bed on side  SABA use: minimal  02: 2lpm at hs and at rest sitting per concentrator   No obvious day to day or daytime variability or assoc excess/ purulent sputum or mucus plugs or hemoptysis or cp or chest tightness, subjective wheeze or overt sinus or hb symptoms.   Sleeping  without nocturnal  exacerbation  of respiratory  c/o's or need for noct saba.  Also denies any obvious fluctuation of symptoms with weather or environmental changes or other aggravating or alleviating factors except as outlined above   No unusual exposure hx or h/o childhood pna/ asthma or knowledge of premature birth.  Current Allergies, Complete Past Medical History, Past Surgical History, Family History, and Social History were reviewed in [facility] electronic medical record.  ROS  The following are not active complaints unless bolded Hoarseness, sore throat, dysphagia, dental problems, itching, sneezing,  nasal congestion or discharge of excess mucus or purulent secretions, ear ache,  fever, chills, sweats, unintended wt loss or wt gain, classically pleuritic or exertional cp,  orthopnea pnd or arm/hand swelling  or leg swelling, presyncope, palpitations, abdominal pain, anorexia, nausea, vomiting, diarrhea  or change in bowel habits or change in bladder habits, change in stools or change in urine, dysuria, hematuria,  rash, arthralgias, visual complaints, headache, numbness, weakness or ataxia or problems with walking or coordination,  change in mood or  memory.        Current Meds  Medication Sig  . albuterol (PROVENTIL HFA;VENTOLIN HFA) 108 (90 Base) MCG/ACT inhaler Inhale 2 puffs into the lungs every 6 (six) hours as needed. Shortness Of Breath  . aspirin EC 81 MG tablet Take 81 mg by mouth daily.  Marland Kitchen Respiratory Therapy Supplies (FLUTTER) DEVI 1 Device by Does not apply route as directed.  Marland Kitchen SPIRIVA RESPIMAT 2.5 MCG/ACT AERS INHALE 2 PUFFS INTO THE LUNGS ONCE DAILY (Patient taking differently: Inhale 2 puffs into the lungs daily. )  . SYMBICORT 160-4.5 MCG/ACT inhaler INHALE 2 PUFFS BY MOUTH TWICE DAILY                  Objective:   Physical Exam     amb somber  wm nad    07/08/2018  10/06/2017        113  08/25/2017          114  07/08/2016        116  05/20/16 115 lb 6.4 oz (52.3 kg)  10/29/12 133 lb (60.3 kg)  08/17/12 128 lb (58.1 kg)       Vital signs reviewed - Note on arrival 02 sats  98% on RA    07/08/2018   HEENT: Poor dentition -   5c mod    HEENT: Poor  dentition / oropharynx. Nl external ear canals without cough reflex -  Mild  bilateral non-specific turbinate edema     NECK :  without JVD/Nodes/TM/ nl carotid upstrokes bilaterally   LUNGS: no acc muscle use,  Mod barrel  contour chest wall with bilateral  Distant bs s audible wheeze and  without cough on insp or exp maneuver and mod  Hyperresonant  to  percussion bilaterally     CV:  RRR  no s3 or murmur or increase in P2, and no edema   ABD:  soft and nontender with pos mid insp Hoover's  in the supine position. No bruits or organomegaly appreciated, bowel sounds nl  MS:   Nl gait/  ext warm without deformities, calf tenderness, cyanosis or clubbing No obvious joint restrictions   SKIN: warm and dry without lesions    NEURO:  alert, approp, nl sensorium with  no motor or cerebellar deficits apparent.                 Assessment:

## 2018-07-09 ENCOUNTER — Encounter: Payer: Self-pay | Admitting: Internal Medicine

## 2018-07-09 NOTE — Assessment & Plan Note (Signed)
Newly placed on 02 at d/c from cap admit  05/31/17 @ 2lpm 24/7  - 07/08/2018   Walked 2lpm poc x one lap =  approx 250 ft - stopped due to "don't want to go farther" but no desat or obvious wob "at the pace he wants to walk" = relatively slow  His sats are fine at rest and based on the new guidelines the way to use oxygen for ambulation is when it helps him increase his activity tolerance to maintain > 90%, which clearly is the case today since he says he can only walk 50 feet without it..  Since he does not seem to be very concerned about this limitation it is unlikely that he will comply with oxygen with ambulation so I left that up to him whether he wants the benefit of the oxygen or not

## 2018-07-09 NOTE — Assessment & Plan Note (Signed)
4-5 min discussion re active cigarette smoking in addition to office E&M  Ask about tobacco use:   ongoing Advise quitting     Discussed the risks (esp around 02)  and costs (both direct and indirect)  of smoking relative to the benefits of quitting but patient unwilling to commit at this point to a specific quit date.   Assess willingness:  Not committed at this point Assist in quit attempt:  Per PCP when ready Arrange follow up:   Follow up per Primary Care planned  For smoking cessation classes call [phone]     I had an extended discussion with the patient /wife reviewing all relevant studies completed to date and  lasting 15 to 20 minutes of a 25 minute visit  which included directly observing ambulatory 02 saturation study documented in a/p section of  today's  office note.  See device teaching which extended face to face time for this visit    Each maintenance medication was reviewed in detail including most importantly the difference between maintenance and prns and under what circumstances the prns are to be triggered using an action plan format that is not reflected in the computer generated alphabetically organized AVS.     Please see AVS for specific instructions unique to this visit that I personally wrote and verbalized to the the pt in detail and then reviewed with pt  by my nurse highlighting any changes in therapy recommended at today's visit .

## 2018-07-09 NOTE — Assessment & Plan Note (Addendum)
Active smoker 05/20/2016    try symbicort 160 2bid  - Spirometry 05/20/2016  FEV1 0.83 (24%)  Ratio 45 p am advair / neb   - PFT's  07/08/2016  FEV1 1.05 (29 % ) ratio 41  p 9 % improvement from saba p symb 160 x 2 prior to study with DLCO  50 % corrects to 56  % for alv volume   - 07/08/2016   > try symb 160/spiriva respimat 2.5   X 2 puff each am  - Alpha one AT screen 07/08/2016 >>  MS  Level 127 - 07/11/2017  After extensive coaching inhaler device  effectiveness =    75% from a baseline of 25% (Ti too short) - 07/11/2017 added flutter valve  - PFT's  08/25/2017  FEV1 0.87 (25 % ) ratio 38  p 4 % improvement from saba p symb/spiriva prior to study   - 07/08/2018  After extensive coaching inhaler device,  effectiveness =    75% (short ti)   Gold IV criteria and still smoking (see separate a/p)    Group D in terms of symptom/risk and laba/lama/ICS  therefore appropriate rx at this point.  Note he does not need to be using SAMA since he is on a [place] that totally saturates the M3 receptors and I have removed the DuoNeb from his list and replaced it with pure  Albuterol neb as "PLAN C"

## 2018-08-11 ENCOUNTER — Other Ambulatory Visit: Payer: Self-pay | Admitting: Internal Medicine

## 2018-08-12 ENCOUNTER — Telehealth: Payer: Self-pay | Admitting: Internal Medicine

## 2018-08-12 MED ORDER — TIOTROPIUM BROMIDE MONOHYDRATE 2.5 MCG/ACT IN AERS: 2.0000 | INHALATION_SPRAY | Freq: Every day | RESPIRATORY_TRACT | 5 refills | Status: DC

## 2018-08-12 NOTE — Telephone Encounter (Signed)
Pt requesting a refill on spiriva.  This has been sent to preferred pharmacy.  Nothing further needed at this time- will close encounter.

## 2018-08-13 ENCOUNTER — Telehealth: Payer: Self-pay | Admitting: Internal Medicine

## 2018-08-13 NOTE — Telephone Encounter (Signed)
Called and spoke with [facility] in [place].  Pharmacist stated that a PA was required by Medicaid for Spiriva 2.5.   Called [facility], [phone], spoke with Cala Bradford.  Spiriva PA initiated and approved for 08/13/18-08/08/19.  PA # [id number].  Call ref # [id number]. Called and spoke with Patient to let him know Spiriva has been approved.  Understanding stated. Notified [facility] of approval.  Prescription processed and went through. Nothing further at this time.

## 2018-09-08 ENCOUNTER — Other Ambulatory Visit: Payer: Self-pay | Admitting: Internal Medicine

## 2018-09-17 ENCOUNTER — Other Ambulatory Visit: Payer: Self-pay | Admitting: Internal Medicine

## 2018-10-27 ENCOUNTER — Other Ambulatory Visit: Payer: Self-pay | Admitting: Internal Medicine

## 2018-11-06 ENCOUNTER — Other Ambulatory Visit: Payer: Self-pay | Admitting: Pulmonary Disease

## 2018-12-28 ENCOUNTER — Other Ambulatory Visit: Payer: Self-pay | Admitting: Internal Medicine

## 2019-01-06 ENCOUNTER — Ambulatory Visit: Payer: Medicaid Other | Admitting: Internal Medicine

## 2019-01-11 ENCOUNTER — Encounter: Payer: Self-pay | Admitting: Internal Medicine

## 2019-01-11 ENCOUNTER — Ambulatory Visit (INDEPENDENT_AMBULATORY_CARE_PROVIDER_SITE_OTHER): Payer: Medicaid Other | Admitting: Internal Medicine

## 2019-01-11 ENCOUNTER — Other Ambulatory Visit: Payer: Self-pay

## 2019-01-11 DIAGNOSIS — J9611 Chronic respiratory failure with hypoxia: Secondary | ICD-10-CM | POA: Diagnosis not present

## 2019-01-11 DIAGNOSIS — J449 Chronic obstructive pulmonary disease, unspecified: Secondary | ICD-10-CM | POA: Diagnosis not present

## 2019-01-11 DIAGNOSIS — F17210 Nicotine dependence, cigarettes, uncomplicated: Secondary | ICD-10-CM

## 2019-01-11 NOTE — Progress Notes (Signed)
Subjective:     Patient ID: Tony [name], male   DOB: 1961/03/06     MRN: [id number]    Brief patient profile:  [id number] yowm active smoker dx copd  2014 with onset of doe in 2010 and maint on advair/ albuterol referred to pulmonary clinic [facility]/20/2016 [facility] by Kizzie Furnishochelle Muse, NP with GOLD IV criteria 05/20/16 and MS phenotype with nl alpha one AT levels documented 07/08/16       History of Present Illness  05/20/2016 1st [facility] office visit/ Wert  maint rx advair 250 bid/ neb alb tid and saba hfa prn  Chief Complaint  Patient presents with  . Pulmonary Consult    Referred by Kizzie Furnishochelle Muse, NP. Pt c/o SOB since 2014, worse "for a while".   He gets winded just walking short distances such as walking to his back yard. He also c/o left side pain. He also c/o cough- prod with thick, clear to white sputum.    doe x MMRC3 = can't walk 100 yards even at a slow pace at a flat grade s stopping due to sob  Without much fluctuation  Has bilateral L CP  24/7 x 3 years but worse with coughing and when lying on L side  rec Plan A = Automatic = Symbicort 160 Take 2 puffs first thing in am and then another 2 puffs about 12 hours later.  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 He complains of worsen nonproductive cough without fever, chills, hemoptysis. Initial CXR showed atelectasis vs early interstitial pneumonia. The patient was started on IV solumedrol, Duonebs and antibiotics.    Discharge Diagnoses:  Acute respiratory failure with hypoxia -secondary to COPD exacerbation -presently stable on2L [place] -very little  functional reserve--dyspneic with minimal exertion -ambulatory pulse ox showed desaturation on RA -set up home oxygen--2L  COPD Exacerbation -continueDuoNebs q 4 hours during the hospitalzation -d/c spiriva -continue solu-medrol>>>prednisone taper over 2 weeks after d/c -continuepulmicort -continue ceftriaxone>>home with levofloxacin 2 days  -do not feel pt has pneumonia--urine strep pneumoniae antigen nonspecific -slow improvementeach day  Tobacco abuse -cessation discussed -nicotine patch  Poor Health Literacy -pt cannot read or write  Alcohol dependence -drinks up to 6 beers daily -CIWA -no signs of withdrawal     07/11/2017  f/u ov/Wert re:  Transition of care s/p aeCopd /newly on 02 2lpm 25/7  poor hfa and still smoking Chief Complaint  Patient presents with  . Follow-up    Pt states he has good days and bad days. On 07/09/17 and 07/10/17, pt states his breathing was a lot worse with the weather change. Breathing has been doing good today. Pt does have occ coughing but is unable to get any mucus up. Denies any CP.   overall about the same [facility] can't walk 100 yards even at a slow pace at a flat grade s stopping due to sob  Even on 02/ has difficulty with chest congestion/ rattling but what mucus he does bring up is mucoid, thick, not purulent worse in am's  but able to sleep ok on 2 pillows  No obvious patterns day to day or daytime variability or assoc   mucus plugs or hemoptysis or cp or chest tightness, subjective wheeze or overt sinus or hb symptoms. No unusual exposure hx or h/o childhood pna/ asthma or knowledge of premature birth.  Sleeping ok ok 2lpm on 2 pillows without nocturnal   exacerbation  of respiratory  c/o's or need for noct saba. Also denies any obvious fluctuation of symptoms with weather or environmental changes or other aggravating or alleviating factors except as outlined above  rec For cough > mucinex 1200 mg every 12  hour and flutter valve as much as possible The key is to stop smoking completely before smoking completely stops you!  Prevnar 13 and flu vaccines today  See your dentist asap  Plan A = Automatic = symbicort/ spiriva daily as you are  Plan B = Backup Only use your albuterol (proair) as a rescue medication  Plan C = Crisis - only use your albuterol nebulizer if you first try Plan B and it fails to help > ok to use the nebulizer up to every 4 hours but if start needing it regularly call for immediate appointment        01/05/2018  f/u ov/Wert re: copd gold iv/ 02 dep / still smoking on symb / spiriva smi  Chief Complaint  Patient presents with  . Follow-up    Breathing is overall doing well. He is using his albuterol inhaler 2-3 x per day and neb about once per day.   Dyspnea:  [employer] one ailse on 02 = MMRC3 = can't walk 100 yards even at a slow pace at a flat grade s stopping due to sob   Cough:  Better/ no am flare  SABA use: as above/ mostly when goes out in heat and over does it 02: 2lpm 24/7   rec The key is to stop smoking completely before smoking completely stops you!  Work on inhaler technique   07/08/2018  f/u ov/Wert re: GOLD IV/ 02 dep but poor insight into how to use it /maint spiriva/ symbicort  Chief Complaint  Patient presents with  . Follow-up    Pt states his breathing is bad today b/c he is in our office and he feels hot. He is using his albuterol inhaler and neb both about once per wk "depends on the weather".   Dyspnea: 50 ft with or without 02 has to stop  Cough: rattling first thing am  Sleeping: flat in bed on side  SABA use: minimal  02: 2lpm at hs and at rest sitting per concentrator rec Plan A = Automatic = symbiocort and spiriva as you are Work on inhaler technique:  Plan B = Backup Only use your albuterol inhaler(Proventil)  as a rescue medication  Plan C = Crisis - only use your albuterol nebulizer if you first try Plan B and it fails to help >  ok to use the nebulizer up to every 4 hours but if start needing it regularly call for immediate appointment     01/11/2019  f/u ov/Wert re:  GOLD IV / 02 dep maint rx symb/spiriva much worse sob with mask use Chief Complaint  Patient presents with  . Follow-up  Dyspnea:  MMRC3 = can't walk 100 yards even at a slow pace at a flat grade s stopping due to sob / not using 02 with activity as rec  Cough: none  Sleeping: no  resp complaint  SABA use: way over using  02: only uses 02 3lpm hs / rarely using POC 2lpm    No obvious day to day or daytime variability or assoc excess/ purulent sputum or mucus plugs or hemoptysis or cp or chest tightness, subjective wheeze or overt sinus or hb symptoms.   Sleeping flat on 3lpm  without nocturnal  or early am exacerbation  of respiratory  c/o's or need for noct saba. Also denies any obvious fluctuation of symptoms with weather or environmental changes or other aggravating or alleviating factors except as outlined above   No unusual exposure hx or h/o childhood pna/ asthma or knowledge of premature birth.  Current Allergies, Complete Past Medical History, Past Surgical History, Family History, and Social History were [facility] electronic [facility].  ROS  The following are not active complaints unless bolded Hoarseness, sore throat, dysphagia, dental problems, itching, sneezing,  nasal congestion or discharge of excess mucus or purulent secretions, ear ache,   fever, chills, sweats, unintended wt loss or wt gain, classically pleuritic or exertional cp,  orthopnea pnd or arm/hand swelling  or leg swelling, presyncope, palpitations, abdominal pain, anorexia, nausea, vomiting, diarrhea  or change in bowel habits or change in bladder habits, change in stools or change in urine, dysuria, hematuria,  rash, arthralgias, visual complaints, headache, numbness, weakness or ataxia or problems with walking or coordination,  change in mood or   memory.        Current Meds  Medication Sig  . albuterol (PROVENTIL HFA;VENTOLIN HFA) 108 (90 Base) MCG/ACT inhaler Inhale 2 puffs into the lungs every 6 (six) hours as needed. Shortness Of Breath  . albuterol (PROVENTIL) (2.5 MG/3ML) 0.083% nebulizer solution USE 1 VIAL IN NEBULIZER EVERY 4 HOURS AS NEEDED FOR WHEEZING FOR SHORTNESS OF BREATH  . aspirin EC 81 MG tablet Take 81 mg by mouth daily.  . OXYGEN O2 2LPM "When I am at home"  . Respiratory Therapy Supplies (FLUTTER) DEVI 1 Device by Does not apply route as directed.  . SYMBICORT 160-4.5 MCG/ACT inhaler Inhale 2 puffs by mouth twice daily  . Tiotropium Bromide Monohydrate (SPIRIVA RESPIMAT) 2.5 MCG/ACT AERS Inhale 2 puffs into the lungs daily.                Objective:   Physical Exam    amb  Wm   01/11/2019         115  10/06/2017        113  08/25/2017          114  07/08/2016        116  05/20/16 115 lb 6.4 oz (52.3 kg)  10/29/12 133 lb (60.3 kg)  08/17/12 128 lb (58.1 kg)     Vital signs reviewed - Note on arrival 02 sats  97% on 2lpm poc     HEENT: Poor dentition and nl  oropharynx. Nl external ear canals without cough reflex -  Mild bilateral non-specific turbinate edema     NECK :  without JVD/Nodes/TM/ nl carotid upstrokes bilaterally   LUNGS: no acc muscle use,  Mod barrel  contour chest wall with bilateral  Distant bs s audible wheeze and  without cough on insp or exp maneuver and mod  Hyperresonant  to  percussion bilaterally     CV:  RRR  no s3 or murmur or increase in P2, and no edema   ABD:  soft and nontender with pos mid insp Hoover's  in  the supine position. No bruits or organomegaly appreciated, bowel sounds nl  MS:     ext warm without deformities, calf tenderness, cyanosis or clubbing No obvious joint restrictions   SKIN: warm and dry without lesions    NEURO:  alert, approp, nl sensorium with  no motor or cerebellar deficits apparent.               Assessment:

## 2019-01-11 NOTE — Patient Instructions (Addendum)
rec you monitor your 0xygen level any time you decide to take your 02 off any reason sitting vs walking with goal of keeping 02 level above 90% at all times   Only use your albuterol as a rescue medication to be used if you can't catch your breath by resting or doing a relaxed purse lip breathing pattern.  - The less you use it, the better it will work when you need it. - Ok to use up to 2 puffs  every 4 hours if you must but call for immediate appointment if use goes up over your usual need - Don't leave home without it !!  (think of it like the spare tire for your car)   Please schedule a follow up visit in 6  months but call sooner if needed

## 2019-01-13 ENCOUNTER — Encounter: Payer: Self-pay | Admitting: Internal Medicine

## 2019-01-13 NOTE — Assessment & Plan Note (Signed)
Active smoker 05/20/2016    try symbicort 160 2bid  - Spirometry 05/20/2016  FEV1 0.83 (24%)  Ratio 45 p am advair / neb   - PFT's  07/08/2016  FEV1 1.05 (29 % ) ratio 41  p 9 % improvement from saba p symb 160 x 2 prior to study with DLCO  50 % corrects to 56  % for alv volume   - 07/08/2016   > try symb 160/spiriva respimat 2.5   X 2 puff each am  - Alpha one AT screen 07/08/2016 >>  MS  Level 127 - 07/11/2017  After extensive coaching inhaler device  effectiveness =    75% from a baseline of 25% (Ti too short) - 07/11/2017 added flutter valve  - PFT's  08/25/2017  FEV1 0.87 (25 % ) ratio 38  p 4 % improvement from saba p symb/spiriva prior to study   - 01/11/2019  After extensive coaching inhaler device,  effectiveness =    75% (short Ti)   Patient continuing to smoke and inappropriately use oxygen and his rescue therapy.    I spent extra time with pt today reviewing appropriate use of albuterol for prn use on exertion with the following points: 1) saba is for relief of sob that does not improve by walking a slower pace or resting but rather if the pt does not improve after trying this first. 2) If the pt is convinced, as many are, that saba helps recover from activity faster then it's easy to tell if this is the case by re-challenging : ie stop, take the inhaler, then p 5 minutes try the exact same activity (intensity of workload) that just caused the symptoms and see if they are substantially diminished or not after saba 3) if there is an activity that reproducibly causes the symptoms, try the saba 15 min before the activity on alternate days   If in fact the saba really does help, then fine to continue to use it prn but advised may need to look closer at the maintenance regimen being used to achieve better control of airways disease with exertion.     Group D in terms of symptom/risk and laba/lama/ICS  therefore appropriate rx at this point >>>  Continue symb/spiriva  > could consider  combination of Labe Lama and inhaled steroids per nebulizer if his insurance would cover.

## 2019-01-13 NOTE — Assessment & Plan Note (Signed)

## 2019-01-13 NOTE — Assessment & Plan Note (Addendum)
Newly placed on 02 at d/c from cap admit  05/31/17 @ 2lpm 24/7  - 07/08/2018   Walked 2lpm poc x one lap =  approx 250 ft - stopped due to "don't want to go farther" but no desat or obvious wob "at the pace he wants to walk" = relatively slow  Advised that if he is going to take oxygen off during the day he should monitor his saturations both at rest and walking to assure that he is not dropping below 90% as this actually may be the cause of some of his dyspnea.  I rather him use oxygen in the setting that extra albuterol because of the tachyphylaxis issues involved in overusing albuterol, which I believe is becoming the case now.   I had an extended discussion with the patient reviewing all relevant studies completed to date and  lasting 15 to 20 minutes of a 25 minute visit    I performed detailed device teaching using a teach back method which extended face to face time for this visit (see above)  Each maintenance medication was reviewed in detail including emphasizing most importantly the difference between maintenance and prns and under what circumstances the prns are to be triggered using an action plan format that is not reflected in the computer generated alphabetically organized AVS which I have not found useful in most complex patients, especially with respiratory illnesses  Please see AVS for specific instructions unique to this visit that I personally wrote and verbalized to the the pt in detail and then reviewed with pt  by my nurse highlighting any  changes in therapy recommended at today's visit to their plan of care.

## 2019-01-17 IMAGING — CR DG CHEST 1V PORT
1 series · 1 of 1 positions shown · non-contrast
Comparison: Chest x-ray of May 20, 2016

CLINICAL DATA: Productive cough and shortness of breath since
yesterday. History of asthma -COPD, current smoker.

EXAM:
PORTABLE CHEST 1 VIEW

[portable]
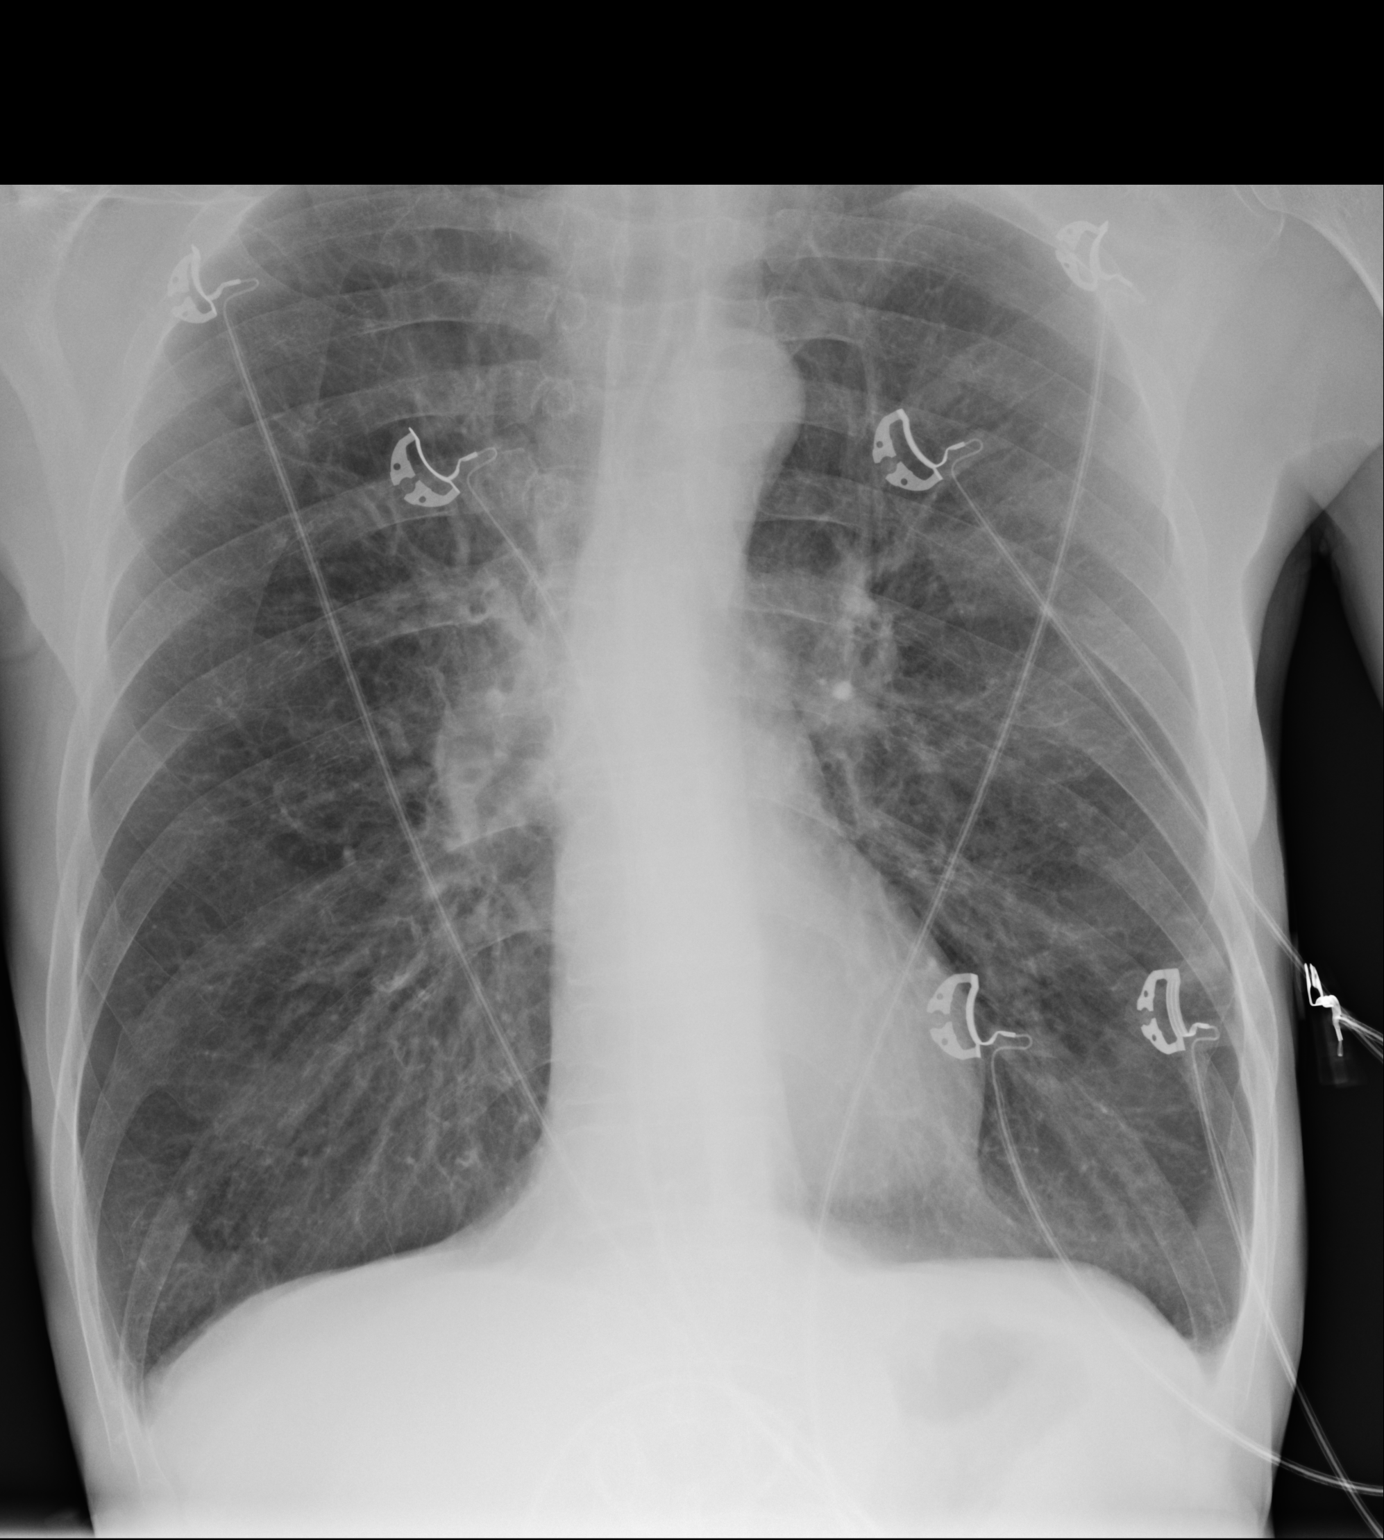

[1 of 1 positions shown; findings below may reference images not displayed]

FINDINGS: The lungs are hyperinflated with hemidiaphragm flattening. There is
subtle increased density in the left lung in the upper, mid, and
lower perihilar regions. There is no pleural effusion or
pneumothorax. The heart and pulmonary vascularity are normal.
IMPRESSION: Probable subsegmental atelectasis or early interstitial pneumonia on
the left superimposed upon significant emphysematous changes. No
overt CHF. Followup PA and lateral chest X-ray is recommended in 3-4
weeks following trial of antibiotic therapy to ensure resolution and
exclude underlying malignancy.

## 2019-02-04 ENCOUNTER — Other Ambulatory Visit: Payer: Self-pay | Admitting: Internal Medicine

## 2019-03-04 IMAGING — DX DG CHEST 2V
2 series · 2 of 2 positions shown · non-contrast
Comparison: 05/26/2017 and 05/20/2016 radiograph, CT chest
07/31/2012

CLINICAL DATA: COPD

EXAM:
CHEST  2 VIEW

[chest pa]
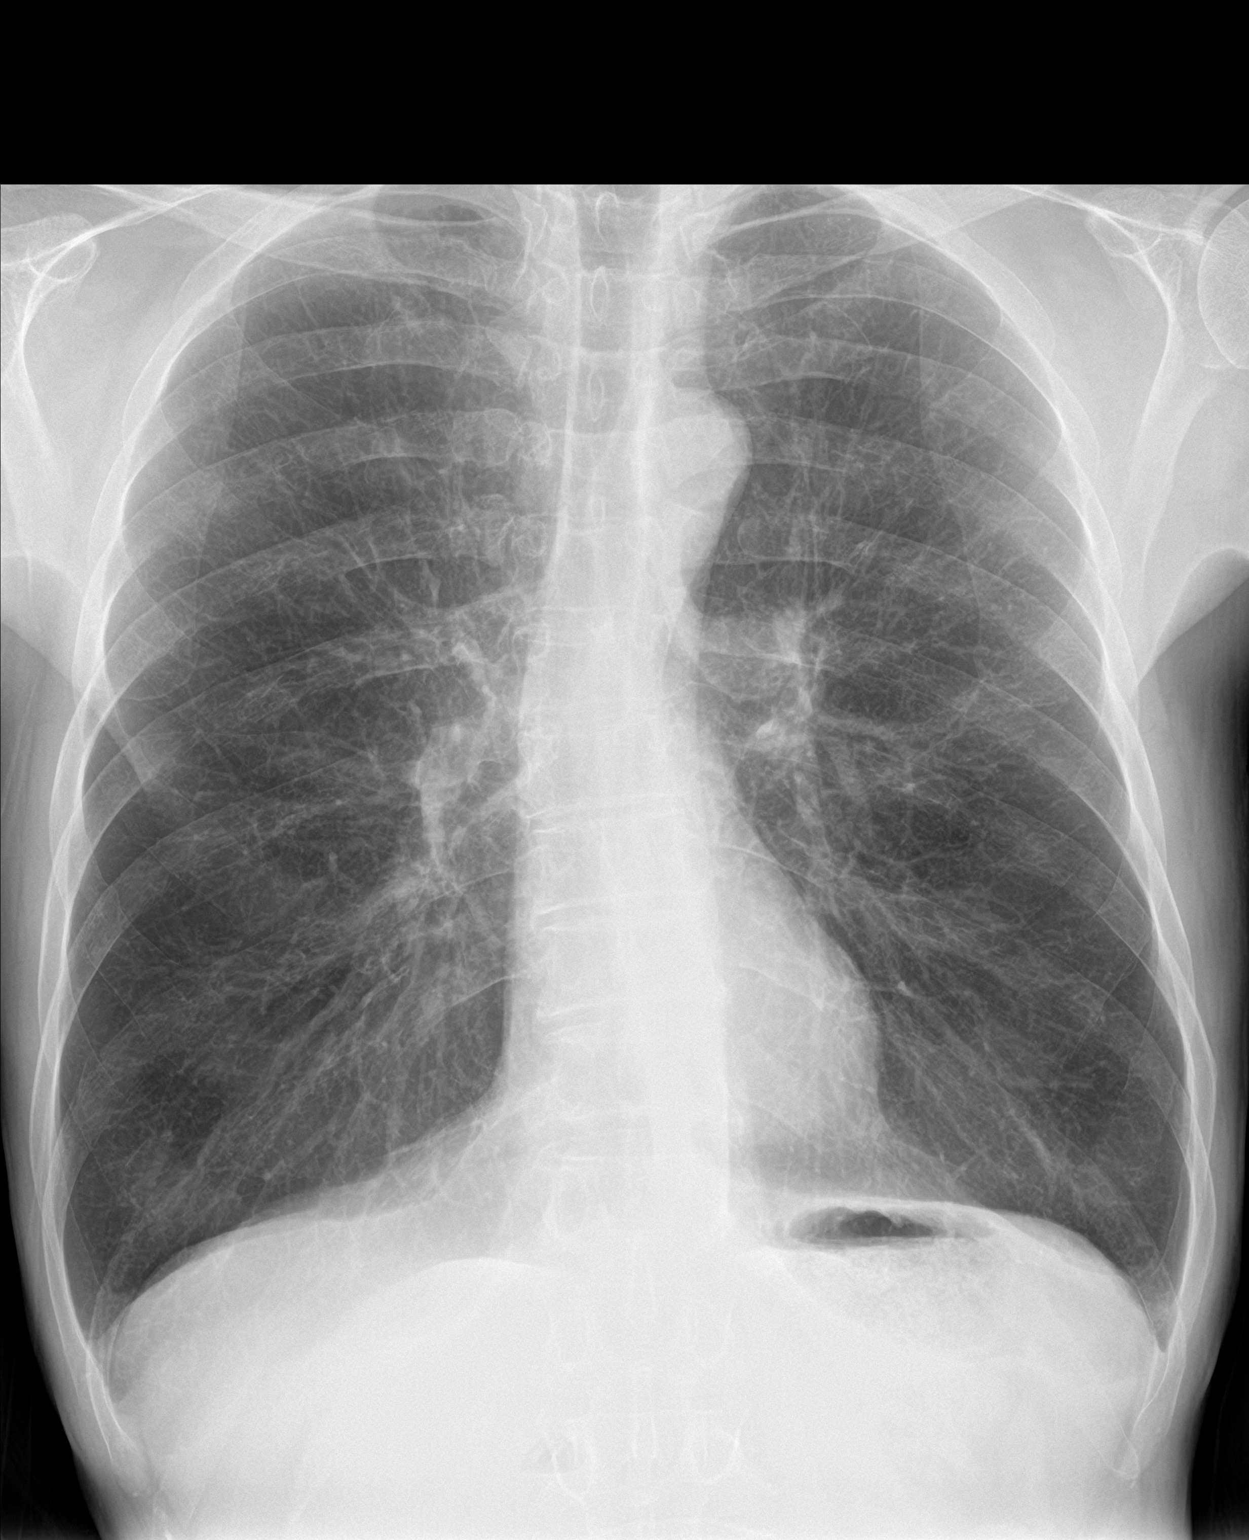

[chest lat]
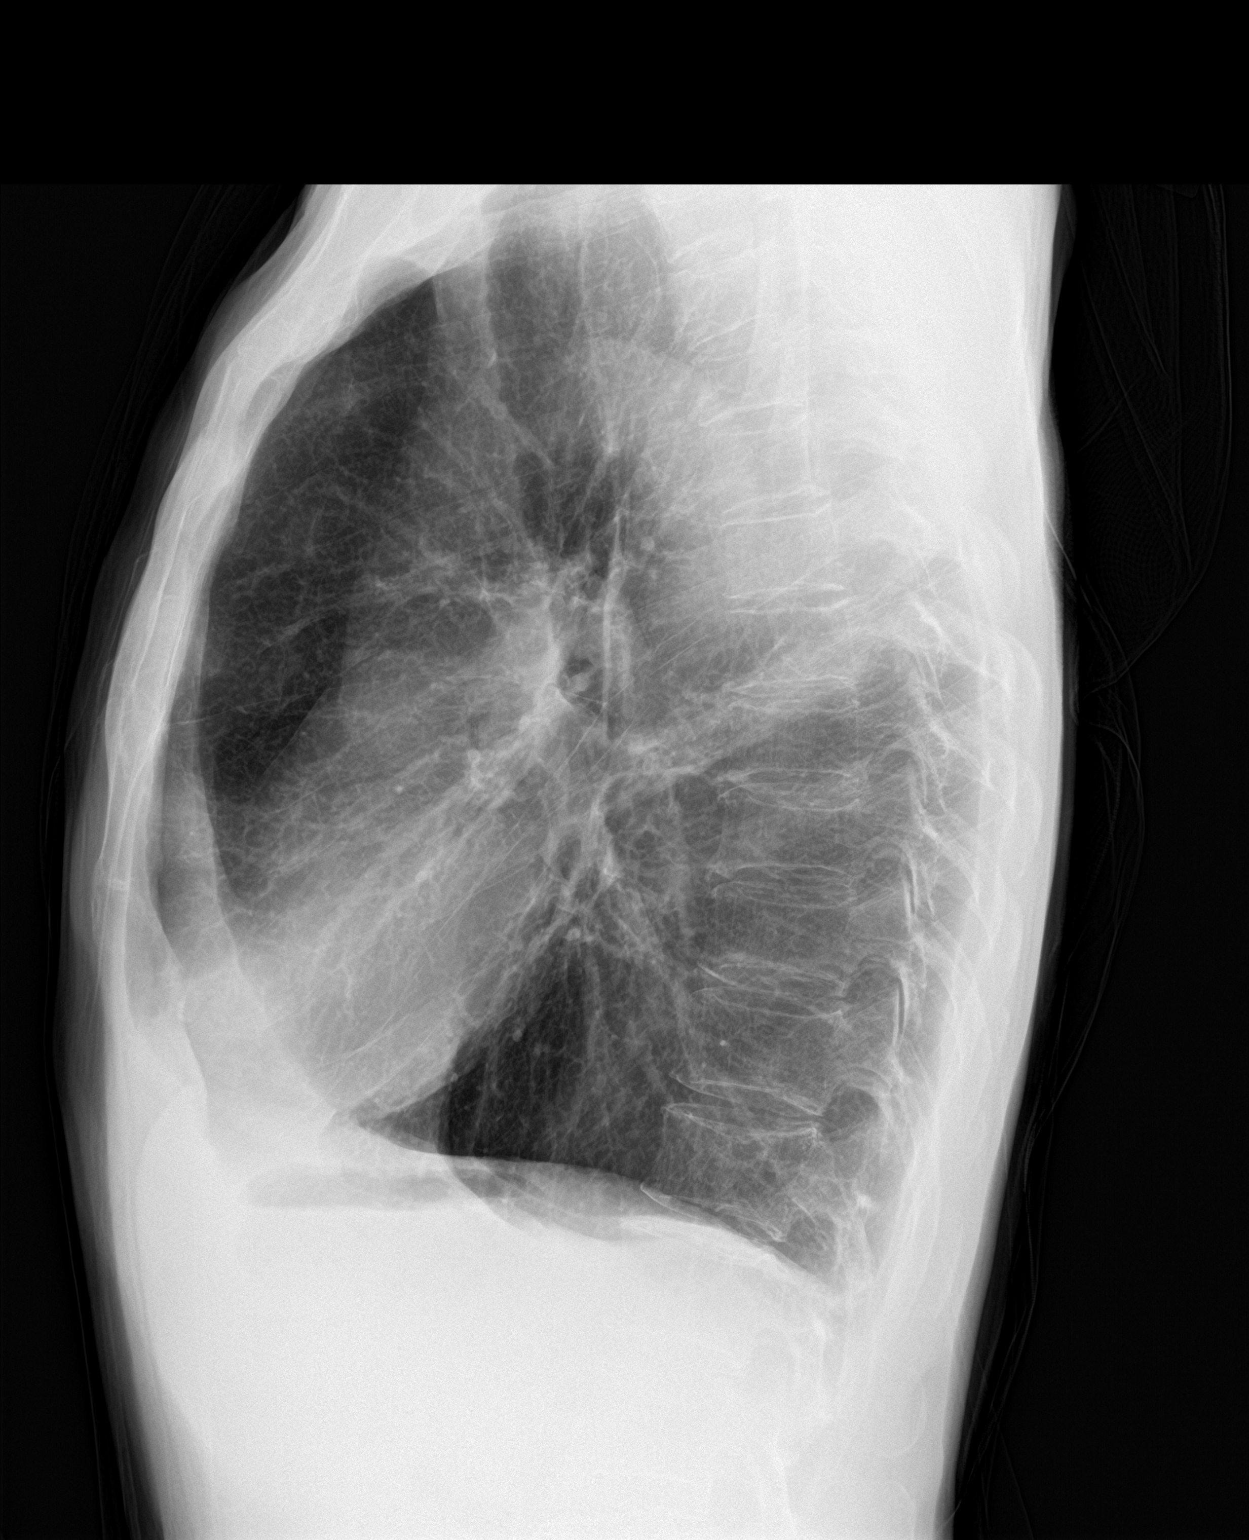

[2 of 2 positions shown; findings below may reference images not displayed]

FINDINGS: Hyperinflation with emphysematous changes. No acute consolidation,
pleural effusion or pneumothorax. A bandlike opacity on the lateral
view posteriorly could reflect atelectasis or scarring, this is not
well seen on the frontal view. Cardiomediastinal silhouette within
normal limits. No pneumothorax.
IMPRESSION: Hyperinflation with emphysematous disease. Bandlike opacity
posteriorly on the lateral view, could reflect atelectasis or
partial consolidation, not well seen on frontal view; short interval
radiographic follow-up is recommended.

## 2019-07-08 ENCOUNTER — Other Ambulatory Visit: Payer: Self-pay | Admitting: Internal Medicine

## 2019-07-16 ENCOUNTER — Telehealth: Payer: Self-pay

## 2019-07-16 NOTE — Telephone Encounter (Signed)
PA request was received from (pharmacy): [facility]  Phone:  Fax: [phone] Medication name and strength: Spiriva 2.5 Ordering Provider: Dr.Wert  Was PA started with CMM?: NCTracks If yes, please enter KEY:  Medication tried and failed: Combivent, Spiriva  Covered Alternatives: Combivent, Sprivia  PA was approved from 07/16/2019-07/10/2020

## 2019-07-16 NOTE — Telephone Encounter (Signed)
Pharmacy is aware nothing else further needed.

## 2019-10-20 ENCOUNTER — Other Ambulatory Visit: Payer: Self-pay | Admitting: Internal Medicine

## 2020-01-14 ENCOUNTER — Other Ambulatory Visit: Payer: Self-pay | Admitting: Internal Medicine

## 2020-02-16 ENCOUNTER — Other Ambulatory Visit: Payer: Self-pay | Admitting: Pulmonary Disease

## 2020-03-03 ENCOUNTER — Emergency Department (HOSPITAL_COMMUNITY): Payer: Medicaid Other

## 2020-03-03 ENCOUNTER — Other Ambulatory Visit: Payer: Self-pay

## 2020-03-03 ENCOUNTER — Observation Stay (HOSPITAL_COMMUNITY)
Admission: EM | Admit: 2020-03-03 | Discharge: 2020-03-05 | Disposition: A | Discharge status: Home / Self Care | Payer: Medicaid Other | Attending: Family Medicine | Admitting: Family Medicine

## 2020-03-03 ENCOUNTER — Encounter (HOSPITAL_COMMUNITY): Payer: Self-pay

## 2020-03-03 DIAGNOSIS — F17210 Nicotine dependence, cigarettes, uncomplicated: Secondary | ICD-10-CM | POA: Diagnosis not present

## 2020-03-03 DIAGNOSIS — J9611 Chronic respiratory failure with hypoxia: Secondary | ICD-10-CM | POA: Diagnosis not present

## 2020-03-03 DIAGNOSIS — Z720 Tobacco use: Secondary | ICD-10-CM | POA: Diagnosis not present

## 2020-03-03 DIAGNOSIS — R0602 Shortness of breath: Secondary | ICD-10-CM | POA: Diagnosis present

## 2020-03-03 DIAGNOSIS — Z7982 Long term (current) use of aspirin: Secondary | ICD-10-CM | POA: Diagnosis not present

## 2020-03-03 DIAGNOSIS — Z20822 Contact with and (suspected) exposure to covid-19: Secondary | ICD-10-CM | POA: Diagnosis not present

## 2020-03-03 DIAGNOSIS — J9620 Acute and chronic respiratory failure, unspecified whether with hypoxia or hypercapnia: Secondary | ICD-10-CM | POA: Diagnosis not present

## 2020-03-03 DIAGNOSIS — F419 Anxiety disorder, unspecified: Secondary | ICD-10-CM | POA: Diagnosis not present

## 2020-03-03 DIAGNOSIS — I480 Paroxysmal atrial fibrillation: Secondary | ICD-10-CM | POA: Diagnosis present

## 2020-03-03 DIAGNOSIS — J9621 Acute and chronic respiratory failure with hypoxia: Secondary | ICD-10-CM | POA: Diagnosis present

## 2020-03-03 DIAGNOSIS — J441 Chronic obstructive pulmonary disease with (acute) exacerbation: Secondary | ICD-10-CM | POA: Diagnosis not present

## 2020-03-03 HISTORY — DX: Emphysema, unspecified: J43.9

## 2020-03-03 LAB — CBC WITH DIFFERENTIAL/PLATELET
Abs Immature Granulocytes: 0.06 10*3/uL (ref 0.00–0.07)
Basophils Absolute: 0.1 10*3/uL (ref 0.0–0.1)
Basophils Relative: 1 %
Eosinophils Absolute: 0 10*3/uL (ref 0.0–0.5)
Eosinophils Relative: 0 %
HCT: 44.4 % (ref 39.0–52.0)
Hemoglobin: 14.6 g/dL (ref 13.0–17.0)
Immature Granulocytes: 1 %
Lymphocytes Relative: 8 %
Lymphs Abs: 0.8 10*3/uL (ref 0.7–4.0)
MCH: 29 pg (ref 26.0–34.0)
MCHC: 32.9 g/dL (ref 30.0–36.0)
MCV: 88.3 fL (ref 80.0–100.0)
Monocytes Absolute: 1.2 10*3/uL — ABNORMAL HIGH (ref 0.1–1.0)
Monocytes Relative: 12 %
Neutro Abs: 7.7 10*3/uL (ref 1.7–7.7)
Neutrophils Relative %: 78 %
Platelets: 253 10*3/uL (ref 150–400)
RBC: 5.03 MIL/uL (ref 4.22–5.81)
RDW: 12.8 % (ref 11.5–15.5)
WBC: 9.8 10*3/uL (ref 4.0–10.5)
nRBC: 0 % (ref 0.0–0.2)

## 2020-03-03 LAB — SARS CORONAVIRUS 2 BY RT PCR (HOSPITAL ORDER, PERFORMED IN ~~LOC~~ HOSPITAL LAB): SARS Coronavirus 2: NEGATIVE

## 2020-03-03 LAB — BASIC METABOLIC PANEL
Anion gap: 11 (ref 5–15)
BUN: 11 mg/dL (ref 6–20)
CO2: 31 mmol/L (ref 22–32)
Calcium: 9.1 mg/dL (ref 8.9–10.3)
Chloride: 89 mmol/L — ABNORMAL LOW (ref 98–111)
Creatinine, Ser: 0.69 mg/dL (ref 0.61–1.24)
GFR calc Af Amer: >60 mL/min (ref >60)
GFR calc non Af Amer: >60 mL/min (ref >60)
Glucose, Bld: 129 mg/dL — ABNORMAL HIGH (ref 70–99)
Potassium: 4.2 mmol/L (ref 3.5–5.1)
Sodium: 131 mmol/L — ABNORMAL LOW (ref 135–145)

## 2020-03-03 LAB — MAGNESIUM: Magnesium: 1.9 mg/dL (ref 1.7–2.4)

## 2020-03-03 MED ORDER — LEVALBUTEROL HCL 0.63 MG/3ML IN NEBU
INHALATION_SOLUTION | RESPIRATORY_TRACT | Status: AC
  Administered 2020-03-03: 0.63 mg
  Filled 2020-03-03: qty 3

## 2020-03-03 MED ORDER — IPRATROPIUM BROMIDE 0.02 % IN SOLN
RESPIRATORY_TRACT | Status: AC
  Administered 2020-03-03: 0.5 mg
  Filled 2020-03-03: qty 2.5

## 2020-03-03 MED ORDER — MAGNESIUM SULFATE 2 GM/50ML IV SOLN
2.0000 g | Freq: Once | INTRAVENOUS | Status: AC
  Administered 2020-03-03: 2 g via INTRAVENOUS
  Filled 2020-03-03: qty 50

## 2020-03-03 NOTE — ED Notes (Signed)
Rt aware of Peak flow

## 2020-03-03 NOTE — ED Triage Notes (Addendum)
Pt SOB that started this morning. Sats 89% on O2. and given albuterol, duoneb and solumedrol.. Sats improved with O2 4 l. 99%. Pt is vaccinated

## 2020-03-03 NOTE — H&P (Signed)
History and Physical  Tony [name] YJE:[id number] DOB: 29-Jun-1960 DOA: 03/03/2020  Referring physician: Dr Charm Barges, ED physician PCP: Selinda Flavin, MD  Outpatient Specialists:  Patient Coming From: home  Chief Complaint: SOB  HPI: Tony [name] is a 59 y.o. male with a history of COPD with chronic respiratory failure on home O2 2 L with ongoing tobacco abuse.  Patient presents with shortness of breath, cough has been worsening over the past couple of days.  Worse with ambulation and improved with rest.  Cough is nonproductive.  Patient came to the hospital for evaluation.  He was noted to be in atrial fibrillation with rapid ventricular rate.  He was given a breathing treatment and magnesium and his heart rate slowed.  Patient's breathing is much improved and is having intermittent atrial fibrillation now.  Has never been noted to have atrial fibrillation.  No fevers, chills, nausea, vomiting.   Review of Systems:    Pt denies any fevers, chills, nausea, vomiting, diarrhea, constipation, abdominal pain, palpitations, headache, vision changes, lightheadedness, dizziness, melena, rectal bleeding.  Review of systems are otherwise negative  Past Medical History:  Diagnosis Date  . Asthma   . Bronchiolitis   . COPD (chronic obstructive pulmonary disease) (HCC)   . Emphysema   . Emphysema lung (HCC)   . Shortness of breath    Past Surgical History:  Procedure Laterality Date  . COLONOSCOPY N/A 08/17/2012   Procedure: COLONOSCOPY;  Surgeon: Corbin Ade, MD;  Location: [facility];  Service: Endoscopy;  Laterality: N/A;  9:30 AM  . CYST EXCISION Right    posterior neck- 59 years old   Social History:  reports that he has been smoking cigarettes. He has a 20.00 pack-year smoking history. He has never used smokeless tobacco. He reports that he does not drink alcohol and does not use drugs. Patient lives at home  Allergies  Allergen Reactions  . Bee Venom Anaphylaxis     Family History  Problem Relation Age of Onset  . Cancer Mother   . COPD Brother       Prior to Admission medications   Medication Sig Start Date End Date Taking? Authorizing Provider  albuterol (PROVENTIL HFA;VENTOLIN HFA) 108 (90 Base) MCG/ACT inhaler Inhale 2 puffs into the lungs every 6 (six) hours as needed. Shortness Of Breath 06/06/17  Yes Mannam, Praveen, MD  albuterol (PROVENTIL) (2.5 MG/3ML) 0.083% nebulizer solution USE 1 VIAL IN NEBULIZER EVERY 4 HOURS AS NEEDED FOR WHEEZING OR SHORTNESS OF BREATH Patient taking differently: Take 2.5 mg by nebulization every 4 (four) hours as needed for wheezing or shortness of breath.  07/08/19  Yes Nyoka Cowden, MD  aspirin EC 81 MG tablet Take 81 mg by mouth daily.   Yes [provider]  OXYGEN O2 2LPM "When I am at home"   Yes [provider]  Respiratory Therapy Supplies (FLUTTER) DEVI 1 Device by Does not apply route as directed. 07/11/17  Yes Nyoka Cowden, MD  SPIRIVA RESPIMAT 2.5 MCG/ACT AERS INHALE 2 SPRAY(S) BY MOUTH ONCE DAILY Patient taking differently: Inhale 2 sprays into the lungs daily.  10/21/19  Yes Nyoka Cowden, MD  SYMBICORT 160-4.5 MCG/ACT inhaler Inhale 2 puffs by mouth twice daily 10/21/19  Yes Nyoka Cowden, MD    Physical Exam: BP 118/85   Pulse 86   Temp 98.2 F (36.8 C) (Oral)   Resp (!) 29   Wt 48.1 kg   SpO2 99%   BMI 17.64  kg/m   . General: Middle-age male. Awake and alert and oriented x3. No acute cardiopulmonary distress.  Marland Kitchen HEENT: Normocephalic atraumatic.  Right and left ears normal in appearance.  Pupils equal, round, reactive to light. Extraocular muscles are intact. Sclerae anicteric and noninjected.  Moist mucosal membranes. No mucosal lesions.  . Neck: Neck supple without lymphadenopathy. No carotid bruits. No masses palpated.  . Cardiovascular: Irregularly irregular rate. No murmurs, rubs, gallops auscultated. No JVD.  Marland Kitchen Respiratory: Mild wheezing throughout.  No  rales.  No accessory muscle use. . Abdomen: Soft, nontender, nondistended. Active bowel sounds. No masses or hepatosplenomegaly  . Skin: No rashes, lesions, or ulcerations.  Dry, warm to touch. 2+ dorsalis pedis and radial pulses. . Musculoskeletal: No calf or leg pain. All major joints not erythematous nontender.  No upper or lower joint deformation.  Good ROM.  No contractures  . Psychiatric: Intact judgment and insight. Pleasant and cooperative. . Neurologic: No focal neurological deficits. Strength is 5/5 and symmetric in upper and lower extremities.  Cranial nerves II through XII are grossly intact.           Labs on Admission: I have personally reviewed following labs and imaging studies  CBC: Recent Labs  Lab 03/03/20 1514  WBC 9.8  NEUTROABS 7.7  HGB 14.6  HCT 44.4  MCV 88.3  PLT 253   Basic Metabolic Panel: Recent Labs  Lab 03/03/20 1514  NA 131*  K 4.2  CL 89*  CO2 31  GLUCOSE 129*  BUN 11  CREATININE 0.69  CALCIUM 9.1  MG 1.9   GFR: CrCl cannot be calculated (Unknown ideal weight.). Liver Function Tests: No results for input(s): AST, ALT, ALKPHOS, BILITOT, PROT, ALBUMIN in the last 168 hours. No results for input(s): LIPASE, AMYLASE in the last 168 hours. No results for input(s): AMMONIA in the last 168 hours. Coagulation Profile: No results for input(s): INR, PROTIME in the last 168 hours. Cardiac Enzymes: No results for input(s): CKTOTAL, CKMB, CKMBINDEX, TROPONINI in the last 168 hours. BNP (last 3 results) No results for input(s): PROBNP in the last 8760 hours. HbA1C: No results for input(s): HGBA1C in the last 72 hours. CBG: No results for input(s): GLUCAP in the last 168 hours. Lipid Profile: No results for input(s): CHOL, HDL, LDLCALC, TRIG, CHOLHDL, LDLDIRECT in the last 72 hours. Thyroid Function Tests: No results for input(s): TSH, T4TOTAL, FREET4, T3FREE, THYROIDAB in the last 72 hours. Anemia Panel: No results for input(s): VITAMINB12,  FOLATE, FERRITIN, TIBC, IRON, RETICCTPCT in the last 72 hours. Urine analysis:    Component Value Date/Time   COLORURINE YELLOW 03/02/2010 0852   APPEARANCEUR CLEAR 03/02/2010 0852   LABSPEC 1.005 03/02/2010 0852   PHURINE 6.5 03/02/2010 0852   GLUCOSEU NEGATIVE 03/02/2010 0852   HGBUR SMALL (A) 03/02/2010 0852   BILIRUBINUR NEGATIVE 03/02/2010 0852   KETONESUR NEGATIVE 03/02/2010 0852   PROTEINUR NEGATIVE 03/02/2010 0852   UROBILINOGEN 0.2 03/02/2010 0852   NITRITE NEGATIVE 03/02/2010 0852   LEUKOCYTESUR TRACE (A) 03/02/2010 0852   Sepsis Labs: @LABRCNTIP (procalcitonin:4,lacticidven:4) ) Recent Results (from the past 240 hour(s))  SARS Coronavirus 2 by RT PCR (hospital order, performed in [facility] hospital lab) Nasopharyngeal Nasopharyngeal Swab     Status: None   Collection Time: 03/03/20  4:11 PM   Specimen: Nasopharyngeal Swab  Result Value Ref Range Status   SARS Coronavirus 2 NEGATIVE NEGATIVE Final    Comment: (NOTE) SARS-CoV-2 target nucleic acids are NOT DETECTED.  The SARS-CoV-2 RNA is generally  detectable in upper and lower respiratory specimens during the acute phase of infection. The lowest concentration of SARS-CoV-2 viral copies this assay can detect is 250 copies / mL. A negative result does not preclude SARS-CoV-2 infection and should not be used as the sole basis for treatment or other patient management decisions.  A negative result may occur with improper specimen collection / handling, submission of specimen other than nasopharyngeal swab, presence of viral mutation(s) within the areas targeted by this assay, and inadequate number of viral copies (<250 copies / mL). A negative result must be combined with clinical observations, patient history, and epidemiological information.  Fact Sheet for Patients:   BoilerBrush.com.cy  Fact Sheet for Healthcare Providers: https://pope.com/  This test is not yet  approved or  cleared by the [place] FDA and has been authorized for detection and/or diagnosis of SARS-CoV-2 by FDA under an Emergency Use Authorization (EUA).  This EUA will remain in effect (meaning this test can be used) for the duration of the COVID-19 declaration under Section 564(b)(1) of the Act, 21 U.S.C. section 360bbb-3(b)(1), unless the authorization is terminated or revoked sooner.  Performed at [facility], [address] [place], [place] [address]      Radiological Exams on Admission: DG Chest [facility] 1 View  Result Date: 03/03/2020 CLINICAL DATA:  Shortness of breath since this morning, emphysema EXAM: PORTABLE CHEST 1 VIEW COMPARISON:  05/29/2018 FINDINGS: Single frontal view of the chest demonstrates a stable cardiac silhouette. Lungs are markedly hyperinflated, with diffuse interstitial scarring and fibrosis compatible with chronic emphysema. No focal consolidation, effusion, or pneumothorax. No acute bony abnormalities. IMPRESSION: 1. Stable emphysema.  No acute process. Electronically Signed   By: Sharlet Salina M.D.   On: 03/03/2020 15:51    EKG: Independently reviewed.  Paroxysmal atrial fibrillation.  No ST changes.  Assessment/Plan: Active Problems:   Tobacco abuse   COPD exacerbation (HCC)   Chronic respiratory failure with hypoxia (HCC)   Paroxysmal atrial fibrillation (HCC)    This patient was discussed with the ED physician, including pertinent vitals, physical exam findings, labs, and imaging.  We also discussed care given by the ED provider.  1. Paroxysmal atrial fibrillation a. Observation b. Start Eliquis c. P.o. Cardizem for rate control d. Echocardiogram in the morning e. Check TSH 2. Chronic respiratory failure with hypoxia 3. COPD exacerbation a. Home long-acting inhalers b. Nebs every 2 as needed c. Prednisone 4. Tobacco abuse  DVT prophylaxis: Eliquis Consultants: None Code Status: Full code Family Communication:  None Disposition Plan: Should be able to return home.   Levie Heritage, DO

## 2020-03-03 NOTE — ED Provider Notes (Signed)
[facility] EMERGENCY DEPARTMENT Provider Note   CSN: [id number] Arrival date & time: 03/03/20  1443     History Chief Complaint  Patient presents with  . Shortness of Breath    Tony [name] is a 59 y.o. male.  He has a history of COPD and is on home oxygen 24/7 2 L.  Complaining of increased shortness of breath since he stopped smoking 5 days ago.  Cough productive of yellow sputum.  No fevers no chest pain.  Noted his heart rate was elevated today.  No nausea vomiting diarrhea.  No headache or body aches.  Has had his Covid vaccination.  The history is provided by the patient.  Shortness of Breath Severity:  Severe Onset quality:  Gradual Timing:  Intermittent Progression:  Worsening Chronicity:  Recurrent Relieved by:  Nothing Worsened by:  Activity and coughing Ineffective treatments:  Oxygen Associated symptoms: cough and sputum production   Associated symptoms: no abdominal pain, no chest pain, no fever, no headaches, no hemoptysis, no neck pain, no rash, no sore throat, no syncope and no vomiting   Risk factors: tobacco use        Past Medical History:  Diagnosis Date  . Asthma   . Bronchiolitis   . COPD (chronic obstructive pulmonary disease) (HCC)   . Emphysema   . Emphysema lung (HCC)   . Shortness of breath     Patient Active Problem List   Diagnosis Date Noted  . Chronic respiratory failure with hypoxia (HCC) 07/12/2017  . Poor dentition 07/12/2017  . Alcohol dependence (HCC) 05/29/2017  . Acute respiratory failure with hypoxia (HCC) 05/28/2017  . COPD exacerbation (HCC) 05/26/2017  . Acute on chronic respiratory failure with hypoxia (HCC) 05/26/2017  . CAP (community acquired pneumonia) 05/26/2017  . Cigarette smoker 05/20/2016  . 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 COPD GOLD IV / still smoking  07/31/2012  . Hypokalemia 07/31/2012  . Dyspnea 07/31/2012  . Tobacco abuse 07/31/2012  . Weight loss  07/31/2012  . OPEN FRACTURE DISTAL PHALANX OR PHALANGES HAND 07/06/2007    Past Surgical History:  Procedure Laterality Date  . COLONOSCOPY N/A 08/17/2012   Procedure: COLONOSCOPY;  Surgeon: Corbin Ade, MD;  Location: [facility];  Service: Endoscopy;  Laterality: N/A;  9:30 AM  . CYST EXCISION Right    posterior neck- 59 years old       Family History  Problem Relation Age of Onset  . Cancer Mother   . COPD Brother     Social History   Tobacco Use  . Smoking status: Current Every Day Smoker    Packs/day: 0.50    Years: 40.00    Pack years: 20.00    Types: Cigarettes  . Smokeless tobacco: Never Used  . Tobacco comment: currently smoking 2-3 cigs per day as of 07/11/17  ep  Vaping Use  . Vaping Use: Never used  Substance Use Topics  . Alcohol use: No  . Drug use: No    Home Medications Prior to Admission medications   Medication Sig Start Date End Date Taking? Authorizing Provider  albuterol (PROVENTIL HFA;VENTOLIN HFA) 108 (90 Base) MCG/ACT inhaler Inhale 2 puffs into the lungs every 6 (six) hours as needed. Shortness Of Breath 06/06/17   Mannam, Praveen, MD  albuterol (PROVENTIL) (2.5 MG/3ML) 0.083% nebulizer solution USE 1 VIAL IN NEBULIZER EVERY 4 HOURS AS NEEDED FOR WHEEZING OR SHORTNESS OF BREATH 07/08/19   Nyoka Cowden, MD  aspirin EC 81 MG tablet Take 81 mg by mouth daily.    [provider]  OXYGEN O2 2LPM "When I am at home"    [provider]  Respiratory Therapy Supplies (FLUTTER) DEVI 1 Device by Does not apply route as directed. 07/11/17   Nyoka CowdenWert, Cystal Shannahan B, MD  SPIRIVA RESPIMAT 2.5 MCG/ACT AERS INHALE 2 SPRAY(S) BY MOUTH ONCE DAILY 10/21/19   Nyoka CowdenWert, Zayquan Bogard B, MD  SYMBICORT 160-4.5 MCG/ACT inhaler Inhale 2 puffs by mouth twice daily 10/21/19   Nyoka CowdenWert, Shavette Shoaff B, MD    Allergies    Bee venom  Review of Systems   Review of Systems  Constitutional: Negative for fever.  HENT: Negative for sore throat.   Eyes: Negative for visual  disturbance.  Respiratory: Positive for cough, sputum production and shortness of breath. Negative for hemoptysis.   Cardiovascular: Negative for chest pain and syncope.  Gastrointestinal: Negative for abdominal pain and vomiting.  Genitourinary: Negative for dysuria.  Musculoskeletal: Negative for neck pain.  Skin: Negative for rash.  Neurological: Negative for headaches.    Physical Exam Updated Vital Signs BP (!) 140/100 (BP Location: Left Arm)   Pulse (!) 143   Temp 98.2 F (36.8 C) (Oral)   Resp (!) 21   Wt 48.1 kg   SpO2 92%   BMI 17.64 kg/m   Physical Exam Vitals and nursing note reviewed.  Constitutional:      Appearance: He is well-developed and underweight. He is not toxic-appearing.  HENT:     Head: Normocephalic and atraumatic.  Eyes:     Conjunctiva/sclera: Conjunctivae normal.  Cardiovascular:     Rate and Rhythm: Regular rhythm. Tachycardia present.     Heart sounds: No murmur heard.   Pulmonary:     Effort: Tachypnea and accessory muscle usage present. No respiratory distress.     Breath sounds: Normal breath sounds. Decreased air movement present.  Abdominal:     Palpations: Abdomen is soft.     Tenderness: There is no abdominal tenderness. There is no guarding or rebound.  Musculoskeletal:        General: Normal range of motion.     Cervical back: Neck supple.     Right lower leg: No edema.     Left lower leg: No edema.  Skin:    General: Skin is warm and dry.     Capillary Refill: Capillary refill takes less than 2 seconds.  Neurological:     General: No focal deficit present.     Mental Status: He is alert.     ED Results / Procedures / Treatments   Labs (all labs ordered are listed, but only abnormal results are displayed) Labs Reviewed  BASIC METABOLIC PANEL - Abnormal; Notable for the following components:      Result Value   Sodium 131 (*)    Chloride 89 (*)    Glucose, Bld 129 (*)    All other components within normal limits    CBC WITH DIFFERENTIAL/PLATELET - Abnormal; Notable for the following components:   Monocytes Absolute 1.2 (*)    All other components within normal limits  BASIC METABOLIC PANEL - Abnormal; Notable for the following components:   Sodium 132 (*)    Chloride 89 (*)    CO2 33 (*)    Glucose, Bld 116 (*)    Creatinine, Ser 0.57 (*)    All other components within normal limits  SARS CORONAVIRUS 2 BY RT PCR (HOSPITAL ORDER, [facility])  MAGNESIUM  TSH  HEMOGLOBIN A1C    EKG EKG Interpretation  Date/Time:  Friday March 03 2020 16:04:46 EDT Ventricular Rate:  101 PR Interval:    QRS Duration: 95 QT Interval:  327 QTC Calculation: 424 R Axis:   -123 Text Interpretation: Sinus tachycardia Paired ventricular premature complexes Markedly posterior QRS axis having some p waves Confirmed by Meridee Score [phone]) on 03/03/2020 4:09:14 PM   Radiology DG Chest Port 1 View  Result Date: 03/03/2020 CLINICAL DATA:  Shortness of breath since this morning, emphysema EXAM: PORTABLE CHEST 1 VIEW COMPARISON:  05/29/2018 FINDINGS: Single frontal view of the chest demonstrates a stable cardiac silhouette. Lungs are markedly hyperinflated, with diffuse interstitial scarring and fibrosis compatible with chronic emphysema. No focal consolidation, effusion, or pneumothorax. No acute bony abnormalities. IMPRESSION: 1. Stable emphysema.  No acute process. Electronically Signed   By: Sharlet Salina M.D.   On: 03/03/2020 15:51    Procedures Procedures (including critical care time)  Medications Ordered in ED Medications  aspirin EC tablet 81 mg (has no administration in time range)  umeclidinium bromide (INCRUSE ELLIPTA) 62.5 MCG/INH 1 puff (1 puff Inhalation Given 03/04/20 0849)  mometasone-formoterol (DULERA) 200-5 MCG/ACT inhaler 2 puff (2 puffs Inhalation Given 03/04/20 0834)  acetaminophen (TYLENOL) tablet 650 mg (has no administration in time range)  ondansetron  (ZOFRAN) injection 4 mg (has no administration in time range)  apixaban (ELIQUIS) tablet 5 mg (has no administration in time range)  diltiazem (CARDIZEM) tablet 30 mg (30 mg Oral Given 03/04/20 0712)  predniSONE (DELTASONE) tablet 40 mg (40 mg Oral Given 03/04/20 0712)  albuterol (PROVENTIL) (2.5 MG/3ML) 0.083% nebulizer solution 2.5 mg (2.5 mg Nebulization Given 03/04/20 0837)  magnesium sulfate IVPB 2 g 50 mL (0 g Intravenous Stopped 03/03/20 1723)  levalbuterol (XOPENEX) 0.63 MG/3ML nebulizer solution (0.63 mg  Given 03/03/20 2254)  ipratropium (ATROVENT) 0.02 % nebulizer solution (0.5 mg  Given 03/03/20 2254)    ED Course  I have reviewed the triage vital signs and the nursing notes.  Pertinent labs & imaging results that were available during my care of the patient were reviewed by me and considered in my medical decision making (see chart for details).  Clinical Course as of Mar 05 1019  Fri Mar 03, 2020  1550 +   [MB]  1602 Patient appears on the monitor to be in sinus bradycardia in the low 100s.  We will repeat another EKG to see if we can capture it.   [MB]  [id number] Patient with COPD exacerbation and what looks like new paroxysmal A. fib.  He is breathing looks more comfortable and he appears to be mostly in sinus.  Discussed with [facility] hospitalist Dr. Adrian Blackwater who will evaluate the patient for possible admission or outpatient management.   [MB]    Clinical Course User Index [MB] Terrilee Files, MD   MDM Rules/Calculators/A&P                         EDGEL DEGNAN was evaluated in Emergency Department on 03/03/2020 for the symptoms described in the history of present illness. He was evaluated in the context of the global COVID-19 pandemic, which necessitated consideration that the patient might be at risk for infection with the SARS-CoV-2 virus that causes COVID-19. Institutional protocols and algorithms that pertain to the evaluation of patients at risk for COVID-19 are in a state  of rapid change based on information released by regulatory  bodies including the CDC and federal and state organizations. These policies and algorithms were followed during the patient's care in the ED.  This patient complains of shortness of breath and cough; this involves an extensive number of treatment Options and is a complaint that carries with it a high risk of complications and Morbidity. The differential includes COPD exacerbation, Covid, pneumonia, pneumothorax, PE, arrhythmia, ACS, anemia, metabolic derangement  I ordered, reviewed and interpreted labs, which included CBC with normal white count normal hemoglobin, chemistries fairly unremarkable, normal magnesium, Covid testing negative I ordered medication IV magnesium for COPD, he had already received nebulizer treatments and Solu-Medrol by EMS I ordered imaging studies which included chest x-ray and I independently    visualized and interpreted imaging which showed chronic findings of COPD Other history obtained from EMS who evaluated the patient initially Previous records obtained and reviewed in epic, and frequent admissions for COPD exacerbation, no prior note of atrial fibrillation I consulted Dr. Adrian Blackwater and discussed lab and imaging findings  Critical Interventions: None  After the interventions stated above, I reevaluated the patient and found patient to be breathing more comfortably and appears to be in sinus now.  Due to the on-and-off nature of the A. fib he likely will need to be anticoagulated.  I think the patient would benefit from an observation to allow the steroids to kick in and to make sure that we can get him on anticoagulation with a solid follow-up plan.  Patient is agreeable to plan.   Final Clinical Impression(s) / ED Diagnoses Final diagnoses:  COPD exacerbation (HCC)  PAF (paroxysmal atrial fibrillation) (HCC)    Rx / DC Orders ED Discharge Orders    None       Terrilee Files, MD 03/04/20  1024

## 2020-03-04 ENCOUNTER — Other Ambulatory Visit: Payer: Self-pay

## 2020-03-04 ENCOUNTER — Encounter (HOSPITAL_COMMUNITY): Payer: Self-pay | Admitting: Family Medicine

## 2020-03-04 ENCOUNTER — Observation Stay (HOSPITAL_BASED_OUTPATIENT_CLINIC_OR_DEPARTMENT_OTHER): Payer: Medicaid Other

## 2020-03-04 DIAGNOSIS — J9621 Acute and chronic respiratory failure with hypoxia: Secondary | ICD-10-CM

## 2020-03-04 DIAGNOSIS — I480 Paroxysmal atrial fibrillation: Secondary | ICD-10-CM | POA: Diagnosis not present

## 2020-03-04 LAB — BASIC METABOLIC PANEL
Anion gap: 10 (ref 5–15)
BUN: 11 mg/dL (ref 6–20)
CO2: 33 mmol/L — ABNORMAL HIGH (ref 22–32)
Calcium: 8.9 mg/dL (ref 8.9–10.3)
Chloride: 89 mmol/L — ABNORMAL LOW (ref 98–111)
Creatinine, Ser: 0.57 mg/dL — ABNORMAL LOW (ref 0.61–1.24)
GFR calc Af Amer: >60 mL/min (ref >60)
GFR calc non Af Amer: >60 mL/min (ref >60)
Glucose, Bld: 116 mg/dL — ABNORMAL HIGH (ref 70–99)
Potassium: 4.9 mmol/L (ref 3.5–5.1)
Sodium: 132 mmol/L — ABNORMAL LOW (ref 135–145)

## 2020-03-04 LAB — ECHOCARDIOGRAM COMPLETE
S' Lateral: 3 cm
Weight: 1696 oz

## 2020-03-04 LAB — GLUCOSE, CAPILLARY: Glucose-Capillary: 125 mg/dL — ABNORMAL HIGH (ref 70–99)

## 2020-03-04 LAB — HEMOGLOBIN A1C
Hgb A1c MFr Bld: 5.3 % (ref 4.8–5.6)
Mean Plasma Glucose: 105.41 mg/dL

## 2020-03-04 LAB — TSH: TSH: 0.614 u[IU]/mL (ref 0.350–4.500)

## 2020-03-04 MED ORDER — ALBUTEROL SULFATE (2.5 MG/3ML) 0.083% IN NEBU
2.5000 mg | INHALATION_SOLUTION | RESPIRATORY_TRACT | Status: DC | PRN
  Administered 2020-03-04 – 2020-03-05 (×2): 2.5 mg via RESPIRATORY_TRACT
  Filled 2020-03-04 (×2): qty 3

## 2020-03-04 MED ORDER — ACETAMINOPHEN 325 MG PO TABS: 650.0000 mg | ORAL_TABLET | ORAL | Status: DC | PRN

## 2020-03-04 MED ORDER — ALPRAZOLAM 0.25 MG PO TABS
0.2500 mg | ORAL_TABLET | Freq: Two times a day (BID) | ORAL | Status: DC | PRN
  Administered 2020-03-04 – 2020-03-05 (×2): 0.25 mg via ORAL
  Filled 2020-03-04 (×2): qty 1

## 2020-03-04 MED ORDER — PREDNISONE 20 MG PO TABS
40.0000 mg | ORAL_TABLET | Freq: Every day | ORAL | Status: DC
  Administered 2020-03-04: 40 mg via ORAL
  Filled 2020-03-04: qty 2

## 2020-03-04 MED ORDER — ALBUTEROL SULFATE HFA 108 (90 BASE) MCG/ACT IN AERS: 2.0000 | INHALATION_SPRAY | Freq: Four times a day (QID) | RESPIRATORY_TRACT | Status: DC | PRN

## 2020-03-04 MED ORDER — MOMETASONE FURO-FORMOTEROL FUM 200-5 MCG/ACT IN AERO
2.0000 | INHALATION_SPRAY | Freq: Two times a day (BID) | RESPIRATORY_TRACT | Status: DC
  Administered 2020-03-04 – 2020-03-05 (×3): 2 via RESPIRATORY_TRACT
  Filled 2020-03-04: qty 8.8

## 2020-03-04 MED ORDER — UMECLIDINIUM BROMIDE 62.5 MCG/INH IN AEPB
1.0000 | INHALATION_SPRAY | Freq: Every day | RESPIRATORY_TRACT | Status: DC
  Administered 2020-03-04 – 2020-03-05 (×2): 1 via RESPIRATORY_TRACT
  Filled 2020-03-04: qty 7

## 2020-03-04 MED ORDER — METHYLPREDNISOLONE SODIUM SUCC 40 MG IJ SOLR
40.0000 mg | Freq: Two times a day (BID) | INTRAMUSCULAR | Status: DC
  Administered 2020-03-04 – 2020-03-05 (×2): 40 mg via INTRAVENOUS
  Filled 2020-03-04 (×2): qty 1

## 2020-03-04 MED ORDER — ONDANSETRON HCL 4 MG/2ML IJ SOLN: 4.0000 mg | Freq: Four times a day (QID) | INTRAMUSCULAR | Status: DC | PRN

## 2020-03-04 MED ORDER — ASPIRIN EC 81 MG PO TBEC
81.0000 mg | DELAYED_RELEASE_TABLET | Freq: Every day | ORAL | Status: DC
  Administered 2020-03-04: 81 mg via ORAL
  Filled 2020-03-04: qty 1

## 2020-03-04 MED ORDER — TRAZODONE HCL 50 MG PO TABS
100.0000 mg | ORAL_TABLET | Freq: Every day | ORAL | Status: DC
  Administered 2020-03-04: 100 mg via ORAL
  Filled 2020-03-04: qty 2

## 2020-03-04 MED ORDER — DILTIAZEM HCL 30 MG PO TABS
30.0000 mg | ORAL_TABLET | Freq: Four times a day (QID) | ORAL | Status: DC
  Administered 2020-03-04 – 2020-03-05 (×5): 30 mg via ORAL
  Filled 2020-03-04 (×5): qty 1

## 2020-03-04 MED ORDER — NICOTINE 14 MG/24HR TD PT24
14.0000 mg | MEDICATED_PATCH | Freq: Every day | TRANSDERMAL | Status: DC
  Administered 2020-03-04 – 2020-03-05 (×2): 14 mg via TRANSDERMAL
  Filled 2020-03-04 (×2): qty 1

## 2020-03-04 MED ORDER — APIXABAN 5 MG PO TABS
5.0000 mg | ORAL_TABLET | Freq: Two times a day (BID) | ORAL | Status: DC
  Administered 2020-03-04 – 2020-03-05 (×3): 5 mg via ORAL
  Filled 2020-03-04 (×4): qty 1

## 2020-03-04 MED ORDER — DOXYCYCLINE HYCLATE 100 MG PO TABS
100.0000 mg | ORAL_TABLET | Freq: Two times a day (BID) | ORAL | Status: DC
  Administered 2020-03-04 – 2020-03-05 (×3): 100 mg via ORAL
  Filled 2020-03-04 (×3): qty 1

## 2020-03-04 NOTE — Progress Notes (Addendum)
Patient Demographics:    Tony [name], is a 59 y.o. male, DOB - 1961/05/14, WPY:[id number]  Admit date - 03/03/2020   Admitting Physician Levie Heritage, DO  Outpatient Primary MD for the patient is Selinda Flavin, MD  LOS - 0   Chief Complaint  Patient presents with  . Shortness of Breath        Subjective:    [name] today has no fevers, no emesis,  No chest pain,   --Cough, shortness of breath palpitations and dizziness persist -Significant dyspnea on exertion and limited activity tolerance -Patient states that his shortness of breath is worse subjectively, becomes very anxious with attempts to ambulate him  Assessment  & Plan :    Principal Problem:   Acute on chronic respiratory failure with hypoxia (HCC) Active Problems:   COPD exacerbation (HCC)   Paroxysmal atrial fibrillation (HCC)   Cigarette smoker   Tobacco abuse   Chronic respiratory failure with hypoxia [facility])  Brief Summary:-  59 y.o. male with a history of COPD with chronic respiratory failure on home O2 2 L with ongoing tobacco abuse admitted on 03/03/2020 with acute on chronic hypoxic respiratory failure in the setting of paroxysmal atrial fibrillation with RVR as well as COPD exacerbation and noncompliance with oxygen at home due to the need to continue to smoke  A/p 1) acute on chronic hypoxic respiratory failure--- due to COPD exacerbation and A. fib with RVR --He required up to 4 L of oxygen via nasal cannula, PTA was on 2 L-- --currently weaning oxygen back down to his baseline -Patient remains quite symptomatic with dyspnea, cough, and hypoxia -- use  IV Solu-Medrol --Add doxycycline bronchodilators and mucolytics  2)Paroxysmal atrial fibrillation with RVR----await echocardiogram, continue apixaban for stroke prophylaxis and Cardizem for rate control -If EF is low we will switch to bisoprolol  3)Anxiety  disorder the patient with history of tobacco and alcohol abuse--- xanax-low-dose as needed -Trazodone nightly -Nicotine patch as ordered  4) generalized weakness and deconditioning--PT eval appreciated, no further PT follow-up advised  5) acute COPD exacerbation--- contributing to #1 above -Manage as above #1  Disposition/Need for in-Hospital Stay- patient unable to be discharged at this time due to --persistent dyspnea and worsening hypoxia requiring IV steroids and increased supplemental oxygen, A. fib with RVR requiring further rate control   Disposition: The patient is from: Home              Anticipated d/c is to: Home              Anticipated d/c date is: 1 day              Patient currently is not medically stable to d/c. Barriers: Not Clinically Stable- persistent dyspnea and worsening hypoxia requiring IV steroids and increased supplemental oxygen, A. fib with RVR requiring further rate control  Code Status : full  Family Communication:   (patient is alert, awake and coherent)  Consults  :  na  DVT Prophylaxis  : Apixaban  Lab Results  Component Value Date   PLT 253 03/03/2020    Inpatient Medications  Scheduled Meds: . apixaban  5 mg Oral BID  . aspirin EC  81 mg Oral Daily  . diltiazem  30 mg Oral Q6H  . mometasone-formoterol  2 puff Inhalation BID  . predniSONE  40 mg Oral Q breakfast  . traZODone  100 mg Oral QHS  . umeclidinium bromide  1 puff Inhalation Daily   Continuous Infusions: PRN Meds:.acetaminophen, albuterol, ALPRAZolam, ondansetron (ZOFRAN) IV    Anti-infectives (From admission, onward)   None        Objective:   Vitals:   03/04/20 0441 03/04/20 0835 03/04/20 1048 03/04/20 1404  BP: 123/79  122/84 107/78  Pulse: 91  98 88  Resp: 20   20  Temp: 97.8 F (36.6 C)   97.8 F (36.6 C)  TempSrc: Oral   Oral  SpO2: 100% 95% 100% 100%  Weight:        Wt Readings from Last 3 Encounters:  03/03/20 48.1 kg  01/11/19 52.2 kg  07/08/18  50.1 kg    No intake or output data in the 24 hours ending 03/04/20 1454  Physical Exam  Gen:- Awake Alert, significant dyspnea on exertion HEENT:- [place].AT, No sclera icterus Nose-  2 to 4 L/min Neck-Supple Neck,No JVD,.  Lungs-diminished breath sounds with scattered wheezes CV- S1, S2 normal, irregularly irregular  abd-  +ve B.Sounds, Abd Soft, No tenderness,    Extremity/Skin:- No  edema, pedal pulses present  Psych-affect is very anxious,, oriented x3 Neuro-generalized weakness, no new focal deficits, no tremors   Data Review:   Micro Results Recent Results (from the past 240 hour(s))  SARS Coronavirus 2 by RT PCR (hospital order, performed in [facility] hospital lab) Nasopharyngeal Nasopharyngeal Swab     Status: None   Collection Time: 03/03/20  4:11 PM   Specimen: Nasopharyngeal Swab  Result Value Ref Range Status   SARS Coronavirus 2 NEGATIVE NEGATIVE Final    Comment: (NOTE) SARS-CoV-2 target nucleic acids are NOT DETECTED.  The SARS-CoV-2 RNA is generally detectable in upper and lower respiratory specimens during the acute phase of infection. The lowest concentration of SARS-CoV-2 viral copies this assay can detect is 250 copies / mL. A negative result does not preclude SARS-CoV-2 infection and should not be used as the sole basis for treatment or other patient management decisions.  A negative result may occur with improper specimen collection / handling, submission of specimen other than nasopharyngeal swab, presence of viral mutation(s) within the areas targeted by this assay, and inadequate number of viral copies (<250 copies / mL). A negative result must be combined with clinical observations, patient history, and epidemiological information.  Fact Sheet for Patients:   BoilerBrush.com.cy  Fact Sheet for Healthcare Providers: https://pope.com/  This test is not yet approved or  cleared by the [place]  FDA and has been authorized for detection and/or diagnosis of SARS-CoV-2 by FDA under an Emergency Use Authorization (EUA).  This EUA will remain in effect (meaning this test can be used) for the duration of the COVID-19 declaration under Section 564(b)(1) of the Act, 21 U.S.C. section 360bbb-3(b)(1), unless the authorization is terminated or revoked sooner.  Performed at [facility], [address], [place], [place] [address]     Radiology Reports DG Chest [place] 1 View  Result Date: 03/03/2020 CLINICAL DATA:  Shortness of breath since this morning, emphysema EXAM: PORTABLE CHEST 1 VIEW COMPARISON:  05/29/2018 FINDINGS: Single frontal view of the chest demonstrates a stable cardiac silhouette. Lungs are markedly hyperinflated, with diffuse interstitial scarring and fibrosis compatible with chronic emphysema. No focal consolidation, effusion, or pneumothorax. No acute bony abnormalities. IMPRESSION: 1. Stable emphysema.  No acute process. Electronically Signed   By: Sharlet Salina M.D.   On: 03/03/2020 15:51     CBC Recent Labs  Lab 03/03/20 1514  WBC 9.8  HGB 14.6  HCT 44.4  PLT 253  MCV 88.3  MCH 29.0  MCHC 32.9  RDW 12.8  LYMPHSABS 0.8  MONOABS 1.2*  EOSABS 0.0  BASOSABS 0.1    Chemistries  Recent Labs  Lab 03/03/20 1514 03/04/20 0648  NA 131* 132*  K 4.2 4.9  CL 89* 89*  CO2 31 33*  GLUCOSE 129* 116*  BUN 11 11  CREATININE 0.69 0.57*  CALCIUM 9.1 8.9  MG 1.9  --    ------------------------------------------------------------------------------------------------------------------ No results for input(s): CHOL, HDL, LDLCALC, TRIG, CHOLHDL, LDLDIRECT in the last 72 hours.  No results found for: HGBA1C ------------------------------------------------------------------------------------------------------------------ Recent Labs    03/04/20 0648  TSH 0.614    ------------------------------------------------------------------------------------------------------------------ No results for input(s): VITAMINB12, FOLATE, FERRITIN, TIBC, IRON, RETICCTPCT in the last 72 hours.  Coagulation profile No results for input(s): INR, PROTIME in the last 168 hours.  No results for input(s): DDIMER in the last 72 hours.  Cardiac Enzymes No results for input(s): CKMB, TROPONINI, MYOGLOBIN in the last 168 hours.  Invalid input(s): CK ------------------------------------------------------------------------------------------------------------------    Component Value Date/Time   BNP 35.0 05/26/2017 0835    Shon Hale M.D on 03/04/2020 at 2:54 PM  Go to [email or web address] - for contact info  [facility] - Office  [phone]

## 2020-03-04 NOTE — Evaluation (Signed)
Physical Therapy Evaluation Patient Details Name: Tony [name] MRN: [id number] DOB: [birth year] Today's Date: 03/04/2020   History of Present Illness  Tony [name] is a 59 y.o. male with a history of COPD with chronic respiratory failure on home O2 2 L with ongoing tobacco abuse.  Patient presents with shortness of breath, cough has been worsening over the past couple of days.  Worse with ambulation and improved with rest.  Cough is nonproductive.  Patient came to the hospital for evaluation.  He was noted to be in atrial fibrillation with rapid ventricular rate.  He was given a breathing treatment and magnesium and his heart rate slowed.  Patient's breathing is much improved and is having intermittent atrial fibrillation now.  Has never been noted to have atrial fibrillation.  No fevers, chills, nausea, vomiting.    Clinical Impression  Patient functioning near baseline for functional mobility and gait.  Patient demonstrates good return for ambulation in room on 2 LPM with SpO2 at 94% and tolerated sitting up in chair after therapy.  Plan:  Patient discharged from physical therapy to care of nursing for ambulation daily as tolerated for length of stay.     Follow Up Recommendations No PT follow up;Supervision - Intermittent    Equipment Recommendations  None recommended by PT    Recommendations for Other Services       Precautions / Restrictions Precautions Precautions: None Restrictions Weight Bearing Restrictions: No      Mobility  Bed Mobility Overal bed mobility: Modified Independent             General bed mobility comments: HOB slightly raised  Transfers Overall transfer level: Independent Equipment used: None                Ambulation/Gait Ambulation/Gait assistance: Modified independent (Device/Increase time) Gait Distance (Feet): 22 Feet Assistive device: None Gait Pattern/deviations: WFL(Within Functional Limits) Gait velocity: slightly  decreased   General Gait Details: demonstrates good return for ambulation in room while on 2 LPM O2 without loss of balance  Stairs            Wheelchair Mobility    Modified Rankin (Stroke Patients Only)       Balance Overall balance assessment: No apparent balance deficits (not formally assessed)                                           Pertinent Vitals/Pain Pain Assessment: No/denies pain    Home Living Family/patient expects to be discharged to:: Private residence Living Arrangements: Other relatives Available Help at Discharge: Family;Available 24 hours/day Type of Home: Mobile home Home Access: Stairs to enter Entrance Stairs-Rails: None Entrance Stairs-Number of Steps: 6 Home Layout: One level Home Equipment: Cane - single point      Prior Function Level of Independence: Independent with assistive device(s)         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, drives, 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     Hand Dominance        Extremity/Trunk Assessment   Upper Extremity Assessment Upper Extremity Assessment: Overall WFL for tasks assessed    Lower Extremity Assessment Lower Extremity Assessment: Overall WFL for tasks assessed    Cervical / Trunk Assessment Cervical / Trunk Assessment: Normal  Communication   Communication: No difficulties  Cognition Arousal/Alertness: Awake/alert Behavior During Therapy: WFL for tasks assessed/performed Overall  Cognitive Status: Within Functional Limits for tasks assessed                                        General Comments      Exercises     Assessment/Plan    PT Assessment Patent does not need any further PT services  PT Problem List         PT Treatment Interventions      PT Goals (Current goals can be found in the Care Plan section)  Acute Rehab PT Goals Patient Stated Goal: return home with family to assist PT Goal Formulation: With patient Time For Goal  Achievement: 03/04/20 Potential to Achieve Goals: Good    Frequency     Barriers to discharge        Co-evaluation               AM-PAC PT "6 Clicks" Mobility  Outcome Measure Help needed turning from your back to your side while in a flat bed without using bedrails?: None Help needed moving from lying on your back to sitting on the side of a flat bed without using bedrails?: None Help needed moving to and from a bed to a chair (including a wheelchair)?: None Help needed standing up from a chair using your arms (e.g., wheelchair or bedside chair)?: None Help needed to walk in hospital room?: None Help needed climbing 3-5 steps with a railing? : A Little 6 Click Score: 23    End of Session Equipment Utilized During Treatment: Oxygen Activity Tolerance: Patient tolerated treatment well;Patient limited by fatigue Patient left: in chair;with call bell/phone within reach Nurse Communication: Mobility status PT Visit Diagnosis: Unsteadiness on feet (R26.81);Other abnormalities of gait and mobility (R26.89);Muscle weakness (generalized) (M62.81)    Time: 7353-2992 PT Time Calculation (min) (ACUTE ONLY): 15 min   Charges:   PT Evaluation $PT Eval Low Complexity: 1 Low PT Treatments $Therapeutic Activity: 8-22 mins        11:44 AM, 03/04/20 Ocie Bob, MPT Physical Therapist with [facility] [phone] office [phone] mobile phone

## 2020-03-04 NOTE — Progress Notes (Signed)
  Echocardiogram 2D Echocardiogram has been performed.  Tony [name] 03/04/2020, 12:25 PM

## 2020-03-05 DIAGNOSIS — J9621 Acute and chronic respiratory failure with hypoxia: Secondary | ICD-10-CM | POA: Diagnosis not present

## 2020-03-05 LAB — GLUCOSE, CAPILLARY: Glucose-Capillary: 120 mg/dL — ABNORMAL HIGH (ref 70–99)

## 2020-03-05 MED ORDER — SYMBICORT 160-4.5 MCG/ACT IN AERO: 2.0000 | INHALATION_SPRAY | Freq: Two times a day (BID) | RESPIRATORY_TRACT | 2 refills | Status: AC

## 2020-03-05 MED ORDER — DILTIAZEM HCL ER COATED BEADS 120 MG PO CP24
120.0000 mg | ORAL_CAPSULE | Freq: Once | ORAL | Status: AC
  Administered 2020-03-05: 120 mg via ORAL
  Filled 2020-03-05: qty 1

## 2020-03-05 MED ORDER — OMEPRAZOLE 20 MG PO CPDR: 20.0000 mg | DELAYED_RELEASE_CAPSULE | Freq: Every day | ORAL | 5 refills | Status: DC

## 2020-03-05 MED ORDER — TRAZODONE HCL 100 MG PO TABS: 100.0000 mg | ORAL_TABLET | Freq: Every day | ORAL | 4 refills | Status: DC

## 2020-03-05 MED ORDER — PREDNISONE 20 MG PO TABS
50.0000 mg | ORAL_TABLET | Freq: Once | ORAL | Status: AC
  Administered 2020-03-05: 50 mg via ORAL
  Filled 2020-03-05: qty 1

## 2020-03-05 MED ORDER — APIXABAN 5 MG PO TABS
5.0000 mg | ORAL_TABLET | Freq: Two times a day (BID) | ORAL | 5 refills | Status: DC
Start: 2020-03-05 — End: 2020-03-05

## 2020-03-05 MED ORDER — PREDNISONE 20 MG PO TABS: 40.0000 mg | ORAL_TABLET | Freq: Every day | ORAL | 0 refills | Status: DC

## 2020-03-05 MED ORDER — ALBUTEROL SULFATE HFA 108 (90 BASE) MCG/ACT IN AERS: 2.0000 | INHALATION_SPRAY | RESPIRATORY_TRACT | 3 refills | Status: DC | PRN

## 2020-03-05 MED ORDER — PANTOPRAZOLE SODIUM 40 MG PO TBEC
40.0000 mg | DELAYED_RELEASE_TABLET | Freq: Once | ORAL | Status: AC
  Administered 2020-03-05: 40 mg via ORAL
  Filled 2020-03-05: qty 1

## 2020-03-05 MED ORDER — HYDROXYZINE HCL 25 MG PO TABS: 25.0000 mg | ORAL_TABLET | Freq: Two times a day (BID) | ORAL | 0 refills | Status: AC | PRN

## 2020-03-05 MED ORDER — NICOTINE 14 MG/24HR TD PT24: 14.0000 mg | MEDICATED_PATCH | Freq: Every day | TRANSDERMAL | 0 refills | Status: DC

## 2020-03-05 MED ORDER — DILTIAZEM HCL ER COATED BEADS 120 MG PO CP24: 120.0000 mg | ORAL_CAPSULE | Freq: Every day | ORAL | 11 refills | Status: AC

## 2020-03-05 MED ORDER — ALBUTEROL SULFATE (2.5 MG/3ML) 0.083% IN NEBU: INHALATION_SOLUTION | RESPIRATORY_TRACT | 2 refills | Status: DC

## 2020-03-05 MED ORDER — ASPIRIN EC 81 MG PO TBEC: 81.0000 mg | DELAYED_RELEASE_TABLET | Freq: Every day | ORAL | 11 refills | Status: AC

## 2020-03-05 MED ORDER — DOXYCYCLINE HYCLATE 100 MG PO TABS: 100.0000 mg | ORAL_TABLET | Freq: Two times a day (BID) | ORAL | 0 refills | Status: AC

## 2020-03-05 MED ORDER — SPIRIVA RESPIMAT 2.5 MCG/ACT IN AERS: INHALATION_SPRAY | RESPIRATORY_TRACT | 2 refills | Status: AC

## 2020-03-05 NOTE — Discharge Summary (Signed)
Tony [name], is a 59 y.o. male  DOB Aug 02, 1960  MRN [id number].  Admission date:  03/03/2020  Admitting Physician  Levie Heritage, DO  Discharge Date:  03/05/2020   Primary MD  Selinda Flavin, MD  Recommendations for primary care physician for things to follow:   1)  please Avoid ibuprofen/Advil/Aleve/Motrin/Goody Powders/Naproxen/BC powders/Meloxicam/Diclofenac/Indomethacin and other Nonsteroidal anti-inflammatory medications as these will make you more likely to bleed and can cause stomach ulcers, can also cause Kidney problems.   2) complete abstinence from tobacco strongly advised--- you may use nicotine patch to help you quit smoking  3)you need oxygen at home at 2 L via nasal cannula continuously while awake and while asleep--- smoking or having open fires around oxygen can cause fire, significant injury and death  4) please note that there has been several changes to your medications  5) please follow-up with your primary care physician within a week for recheck and reevaluation   Admission Diagnosis  Paroxysmal atrial fibrillation (HCC) [I48.0] PAF (paroxysmal atrial fibrillation) (HCC) [I48.0] COPD exacerbation (HCC) [J44.1]   Discharge Diagnosis  Paroxysmal atrial fibrillation (HCC) [I48.0] PAF (paroxysmal atrial fibrillation) (HCC) [I48.0] COPD exacerbation (HCC) [J44.1]    Principal Problem:   Acute on chronic respiratory failure with hypoxia (HCC) Active Problems:   COPD exacerbation (HCC)   Paroxysmal atrial fibrillation (HCC)   Cigarette smoker   Tobacco abuse   Chronic respiratory failure with hypoxia (HCC)      Past Medical History:  Diagnosis Date  . Asthma   . Bronchiolitis   . COPD (chronic obstructive pulmonary disease) (HCC)   . Emphysema   . Emphysema lung (HCC)   . Shortness of breath     Past Surgical History:  Procedure Laterality Date  . COLONOSCOPY  N/A 08/17/2012   Procedure: COLONOSCOPY;  Surgeon: Corbin Ade, MD;  Location: [facility];  Service: Endoscopy;  Laterality: N/A;  9:30 AM  . CYST EXCISION Right    posterior neck- 59 years old     HPI  from the history and physical done on the day of admission:    Chief Complaint: SOB  HPI: Tony [name] is a 59 y.o. male with a history of COPD with chronic respiratory failure on home O2 2 L with ongoing tobacco abuse.  Patient presents with shortness of breath, cough has been worsening over the past couple of days.  Worse with ambulation and improved with rest.  Cough is nonproductive.  Patient came to the hospital for evaluation.  He was noted to be in atrial fibrillation with rapid ventricular rate.  He was given a breathing treatment and magnesium and his heart rate slowed.  Patient's breathing is much improved and is having intermittent atrial fibrillation now.  Has never been noted to have atrial fibrillation.  No fevers, chills, nausea, vomiting.   Review of Systems:    Pt denies any fevers, chills, nausea, vomiting, diarrhea, constipation, abdominal pain, palpitations, headache, vision changes, lightheadedness, dizziness, melena, rectal bleeding.  Review of  systems are otherwise negative     Hospital Course:   Brief Summary:- 59 y.o.malewith51 a history of COPD with chronic respiratory failure on home O2 2 L with ongoing tobacco abuse admitted on 03/03/2020 with acute on chronic hypoxic respiratory failure in the setting of paroxysmal atrial fibrillation with RVR as well as COPD exacerbation and noncompliance with oxygen at home due to the need to continue to smoke  A/p 1) acute on chronic hypoxic respiratory failure--- due to COPD exacerbation and A. fib with RVR --He required up to 4 L of oxygen via nasal cannula,  PTA was on 2 L-- --currently patient is back to 2 L of oxygen which is his baseline -Patient was quite symptomatic with dyspnea, cough, and  hypoxia -Treated with IV Solu-Medrol, doxycycline, mucolytics bronchodilators -Overall much improved, okay to discharge home on prednisone and doxycycline  2)Paroxysmal atrial fibrillation with RVR---- -echo with EF of 50 to 55%  -No significant valvular abnormalities  -TSH is 0.614  CHA2DS2- VASc score   is = 0 (zero)   Which is  equal to =0.2  % annual risk of stroke  Pt has No CHF, No DM2. No HTN and pt is 59 yrs old This patients CHA2DS2-VASc Score and unadjusted Ischemic Stroke Rate (% per year) is equal to 0.2 % stroke rate/year from a score of 0  3)Anxiety disorder the patient with history of tobacco and alcohol abuse--- xanax-low-dose as needed -Trazodone nightly -Nicotine patch as ordered Hydroxyzine as needed  4) generalized weakness and deconditioning--PT eval appreciated, no further PT follow-up advised  5) acute COPD exacerbation--- contributing to #1 above -Manage as above #1 -  Disposition --- discharge home  Code Status : full  Family Communication:   (patient is alert, awake and coherent)  Consults  :  na   Discharge Condition: stable  Follow UP   Follow-up Information    Selinda Flavin, MD. Schedule an appointment as soon as possible for a visit in 1 week(s).   Specialty: Family Medicine Why: for Recheck and Re-evaluation Contact information: [address] [place] [address] [phone]                Consults obtained - na  Diet and Activity recommendation:  As advised  Discharge Instructions    Discharge Instructions    Call MD for:  difficulty breathing, headache or visual disturbances   Complete by: As directed    Call MD for:  persistant dizziness or light-headedness   Complete by: As directed    Call MD for:  persistant nausea and vomiting   Complete by: As directed    Call MD for:  severe uncontrolled pain   Complete by: As directed    Call MD for:  temperature >100.4   Complete by: As directed    Diet - low sodium  heart healthy   Complete by: As directed    Discharge instructions   Complete by: As directed    1)  please Avoid ibuprofen/Advil/Aleve/Motrin/Goody Powders/Naproxen/BC powders/Meloxicam/Diclofenac/Indomethacin and other Nonsteroidal anti-inflammatory medications as these will make you more likely to bleed and can cause stomach ulcers, can also cause Kidney problems.   2) complete abstinence from tobacco strongly advised--- you may use nicotine patch to help you quit smoking  3)you need oxygen at home at 2 L via nasal cannula continuously while awake and while asleep--- smoking or having open fires around oxygen can cause fire, significant injury and death  4) please note that there has been  several changes to your medications  5) please follow-up with your primary care physician within a week for recheck and reevaluation   Increase activity slowly   Complete by: As directed         Discharge Medications     Allergies as of 03/05/2020      Reactions   Bee Venom Anaphylaxis      Medication List    TAKE these medications   albuterol 108 (90 Base) MCG/ACT inhaler Commonly known as: VENTOLIN HFA Inhale 2 puffs into the lungs every 4 (four) hours as needed for wheezing or shortness of breath. Shortness Of Breath What changed:   when to take this  reasons to take this   albuterol (2.5 MG/3ML) 0.083% nebulizer solution Commonly known as: PROVENTIL USE 1 VIAL IN NEBULIZER EVERY 4 HOURS AS NEEDED FOR WHEEZING OR SHORTNESS OF BREATH What changed: See the new instructions.   aspirin EC 81 MG tablet Take 1 tablet (81 mg total) by mouth daily with breakfast. What changed: when to take this   diltiazem 120 MG 24 hr capsule Commonly known as: Cardizem CD Take 1 capsule (120 mg total) by mouth daily.   doxycycline 100 MG tablet Commonly known as: VIBRA-TABS Take 1 tablet (100 mg total) by mouth 2 (two) times daily for 5 days. For lung infection   Flutter Devi 1 Device by Does  not apply route as directed.   hydrOXYzine 25 MG tablet Commonly known as: ATARAX/VISTARIL Take 1 tablet (25 mg total) by mouth 2 (two) times daily as needed for anxiety.   nicotine 14 mg/24hr patch Commonly known as: NICODERM CQ - dosed in mg/24 hours Place 1 patch (14 mg total) onto the skin daily.   omeprazole 20 MG capsule Commonly known as: PriLOSEC Take 1 capsule (20 mg total) by mouth daily.   OXYGEN O2 2LPM "When I am at home"   predniSONE 20 MG tablet Commonly known as: Deltasone Take 2 tablets (40 mg total) by mouth daily with breakfast.   Spiriva Respimat 2.5 MCG/ACT Aers Generic drug: Tiotropium Bromide Monohydrate INHALE 2 SPRAY(S) BY MOUTH ONCE DAILY What changed: See the new instructions.   Symbicort 160-4.5 MCG/ACT inhaler Generic drug: budesonide-formoterol Inhale 2 puffs into the lungs 2 (two) times daily.   traZODone 100 MG tablet Commonly known as: DESYREL Take 1 tablet (100 mg total) by mouth at bedtime. For sleep/anxiety      Major procedures and Radiology Reports - PLEASE review detailed and final reports for all details, in brief -  DG Chest Port 1 View  Result Date: 03/03/2020 CLINICAL DATA:  Shortness of breath since this morning, emphysema EXAM: PORTABLE CHEST 1 VIEW COMPARISON:  05/29/2018 FINDINGS: Single frontal view of the chest demonstrates a stable cardiac silhouette. Lungs are markedly hyperinflated, with diffuse interstitial scarring and fibrosis compatible with chronic emphysema. No focal consolidation, effusion, or pneumothorax. No acute bony abnormalities. IMPRESSION: 1. Stable emphysema.  No acute process. Electronically Signed   By: Sharlet Salina M.D.   On: 03/03/2020 15:51   ECHOCARDIOGRAM COMPLETE  Result Date: 03/04/2020    ECHOCARDIOGRAM REPORT   Patient Name:   Tony [name] Date of Exam: 03/04/2020 Medical Rec #:  [id number]        Height:       65.0 in Accession #:    [id number]       Weight:       106.0 lb Date of Birth:   1960-11-06  BSA:          1.510 m Patient Age:    59 years         BP:           122/84 mmHg Patient Gender: M                HR:           98 bpm. [facility] Procedure: 2D Echo Indications:    Atrial Fibrillation  History:        Patient has no prior history of Echocardiogram examinations.                 COPD.  Sonographer:    Thurman Coyerasey Kirkpatrick RDCS (AE) Referring Phys: [id number] JACOB J STINSON IMPRESSIONS  1. Left ventricular ejection fraction, by estimation, is 50 to 55%. The left ventricle has low normal function. The left ventricle has no regional wall motion abnormalities. Left ventricular diastolic parameters are indeterminate.  2. Right ventricular systolic function is normal. The right ventricular size is normal. There is normal pulmonary artery systolic pressure. The estimated right ventricular systolic pressure is 28.6 mmHg.  3. The mitral valve is normal in structure. Trivial mitral valve regurgitation. No evidence of mitral stenosis.  4. The aortic valve is tricuspid. Aortic valve regurgitation is not visualized. Mild aortic valve sclerosis is present, with no evidence of aortic valve stenosis.  5. The inferior vena cava is normal in size with greater than 50% respiratory variability, suggesting right atrial pressure of 3 mmHg. FINDINGS  Left Ventricle: Left ventricular ejection fraction, by estimation, is 50 to 55%. The left ventricle has low normal function. The left ventricle has no regional wall motion abnormalities. The left ventricular internal cavity size was normal in size. There is no left ventricular hypertrophy. Left ventricular diastolic parameters are indeterminate. Right Ventricle: The right ventricular size is normal. No increase in right ventricular wall thickness. Right ventricular systolic function is normal. There is normal pulmonary artery systolic pressure. The tricuspid regurgitant velocity is 2.53 m/s, and  with an assumed right atrial pressure of 3 mmHg, the  estimated right ventricular systolic pressure is 28.6 mmHg. Left Atrium: Left atrial size was normal in size. Right Atrium: Right atrial size was normal in size. Pericardium: There is no evidence of pericardial effusion. Mitral Valve: The mitral valve is normal in structure. Trivial mitral valve regurgitation. No evidence of mitral valve stenosis. Tricuspid Valve: The tricuspid valve is normal in structure. Tricuspid valve regurgitation is not demonstrated. No evidence of tricuspid stenosis. Aortic Valve: The aortic valve is tricuspid. Aortic valve regurgitation is not visualized. Mild aortic valve sclerosis is present, with no evidence of aortic valve stenosis. Pulmonic Valve: The pulmonic valve was normal in structure. Pulmonic valve regurgitation is not visualized. No evidence of pulmonic stenosis. Aorta: The aortic root is normal in size and structure. Venous: The inferior vena cava is normal in size with greater than 50% respiratory variability, suggesting right atrial pressure of 3 mmHg. IAS/Shunts: No atrial level shunt detected by color flow Doppler.  LEFT VENTRICLE PLAX 2D LVIDd:         4.15 cm  Diastology LVIDs:         3.00 cm  LV e' medial:  7.18 cm/s LV PW:         0.99 cm  LV e' lateral: 11.40 cm/s LV IVS:        0.73 cm LVOT diam:     2.00 cm  LVOT Area:     3.14 cm  RIGHT VENTRICLE RV S prime:     10.20 cm/s TAPSE (M-mode): 1.8 cm LEFT ATRIUM             Index       RIGHT ATRIUM           Index LA diam:        2.00 cm 1.32 cm/m  RA Area:     13.00 cm LA Vol (A2C):   19.1 ml 12.65 ml/m RA Volume:   26.70 ml  17.68 ml/m LA Vol (A4C):   25.9 ml 17.15 ml/m LA Biplane Vol: 23.8 ml 15.76 ml/m   AORTA Ao Root diam: 2.80 cm TRICUSPID VALVE TR Peak grad:   25.6 mmHg TR Vmax:        253.00 cm/s  SHUNTS Systemic Diam: 2.00 cm Armanda Magic MD Electronically signed by Armanda Magic MD Signature Date/Time: 03/04/2020/4:19:53 PM    Final     Micro Results  Recent Results (from the past 240 hour(s))  SARS  Coronavirus 2 by RT PCR (hospital order, performed in [facility] hospital lab) Nasopharyngeal Nasopharyngeal Swab     Status: None   Collection Time: 03/03/20  4:11 PM   Specimen: Nasopharyngeal Swab  Result Value Ref Range Status   SARS Coronavirus 2 NEGATIVE NEGATIVE Final    Comment: (NOTE) SARS-CoV-2 target nucleic acids are NOT DETECTED.  The SARS-CoV-2 RNA is generally detectable in upper and lower respiratory specimens during the acute phase of infection. The lowest concentration of SARS-CoV-2 viral copies this assay can detect is 250 copies / mL. A negative result does not preclude SARS-CoV-2 infection and should not be used as the sole basis for treatment or other patient management decisions.  A negative result may occur with improper specimen collection / handling, submission of specimen other than nasopharyngeal swab, presence of viral mutation(s) within the areas targeted by this assay, and inadequate number of viral copies (<250 copies / mL). A negative result must be combined with clinical observations, patient history, and epidemiological information.  Fact Sheet for Patients:   BoilerBrush.com.cy  Fact Sheet for Healthcare Providers: https://pope.com/  This test is not yet approved or  cleared by the [place] FDA and has been authorized for detection and/or diagnosis of SARS-CoV-2 by FDA under an Emergency Use Authorization (EUA).  This EUA will remain in effect (meaning this test can be used) for the duration of the COVID-19 declaration under Section 564(b)(1) of the Act, 21 U.S.C. section 360bbb-3(b)(1), unless the authorization is terminated or revoked sooner.  Performed at [facility], [address] [place], [place] [address]    Today   Subjective    Tony [name] today has no  New complaints No fever  Or chills   No Nausea, Vomiting or Diarrhea -No significant shortness of breath at rest,  dyspnea on exertion has improved significantly, -Cough is improved significantly   Patient has been seen and examined prior to discharge   Objective   Blood pressure (!) 128/93, pulse 88, temperature 98 F (36.7 C), resp. rate (!) 22, weight 48.1 kg, SpO2 99 %.   Intake/Output Summary (Last 24 hours) at 03/05/2020 1037 Last data filed at 03/05/2020 0400 Gross per 24 hour  Intake --  Output 500 ml  Net -500 ml   Exam Gen:- Awake Alert,  no conversational dyspnea  HEENT:- [place].AT, No sclera icterus Nose- [place] 2L/min Neck-Supple Neck,No JVD,.  Lungs-improved air movement without wheezing  CV-  S1, S2 normal, irregularly irregular , heart rate around 90 abd-  +ve B.Sounds, Abd Soft, No tenderness,    Extremity/Skin:- No  edema, pedal pulses present  Psych-affect is appropriate, oriented x3 Neuro- no new focal deficits, no tremors   Data Review   CBC w Diff:  Lab Results  Component Value Date   WBC 9.8 03/03/2020   HGB 14.6 03/03/2020   HCT 44.4 03/03/2020   PLT 253 03/03/2020   LYMPHOPCT 8 03/03/2020   MONOPCT 12 03/03/2020   EOSPCT 0 03/03/2020   BASOPCT 1 03/03/2020    CMP:  Lab Results  Component Value Date   NA 132 (L) 03/04/2020   K 4.9 03/04/2020   CL 89 (L) 03/04/2020   CO2 33 (H) 03/04/2020   BUN 11 03/04/2020   CREATININE 0.57 (L) 03/04/2020   PROT 6.6 05/29/2018   ALBUMIN 3.7 05/29/2018   BILITOT 0.9 05/29/2018   ALKPHOS 59 05/29/2018   AST 22 05/29/2018   ALT 17 05/29/2018  .   Total Discharge time is about 33 minutes  Shon Hale M.D on 03/05/2020 at 10:37 AM  Go to [email or web address] -  for contact info  [facility] - Office  [phone]

## 2020-03-05 NOTE — Discharge Instructions (Signed)
-   1)  please Avoid ibuprofen/Advil/Aleve/Motrin/Goody Powders/Naproxen/BC powders/Meloxicam/Diclofenac/Indomethacin and other Nonsteroidal anti-inflammatory medications as these will make you more likely to bleed and can cause stomach ulcers, can also cause Kidney problems.   2) complete abstinence from tobacco strongly advised--- you may use nicotine patch to help you quit smoking  3)you need oxygen at home at 2 L via nasal cannula continuously while awake and while asleep--- smoking or having open fires around oxygen can cause fire, significant injury and death  4) please note that there has been several changes to your medications  5) please follow-up with your primary care physician within a week for recheck and reevaluation

## 2020-03-05 NOTE — Progress Notes (Signed)
Nsg Discharge Note  Admit Date:  03/03/2020 Discharge date: 03/05/2020   Astrid Drafts to be D/C'd Home per MD order.  AVS completed.  Patient's sister Djibril Glogowski able to verbalize understanding.  Discharge Medication: Allergies as of 03/05/2020      Reactions   Bee Venom Anaphylaxis      Medication List    TAKE these medications   albuterol 108 (90 Base) MCG/ACT inhaler Commonly known as: VENTOLIN HFA Inhale 2 puffs into the lungs every 4 (four) hours as needed for wheezing or shortness of breath. Shortness Of Breath What changed:   when to take this  reasons to take this   albuterol (2.5 MG/3ML) 0.083% nebulizer solution Commonly known as: PROVENTIL USE 1 VIAL IN NEBULIZER EVERY 4 HOURS AS NEEDED FOR WHEEZING OR SHORTNESS OF BREATH What changed: See the new instructions.   aspirin EC 81 MG tablet Take 1 tablet (81 mg total) by mouth daily with breakfast. What changed: when to take this   diltiazem 120 MG 24 hr capsule Commonly known as: Cardizem CD Take 1 capsule (120 mg total) by mouth daily.   doxycycline 100 MG tablet Commonly known as: VIBRA-TABS Take 1 tablet (100 mg total) by mouth 2 (two) times daily for 5 days. For lung infection   Flutter Devi 1 Device by Does not apply route as directed.   hydrOXYzine 25 MG tablet Commonly known as: ATARAX/VISTARIL Take 1 tablet (25 mg total) by mouth 2 (two) times daily as needed for anxiety.   nicotine 14 mg/24hr patch Commonly known as: NICODERM CQ - dosed in mg/24 hours Place 1 patch (14 mg total) onto the skin daily.   omeprazole 20 MG capsule Commonly known as: PriLOSEC Take 1 capsule (20 mg total) by mouth daily.   OXYGEN O2 2LPM "When I am at home"   predniSONE 20 MG tablet Commonly known as: Deltasone Take 2 tablets (40 mg total) by mouth daily with breakfast.   Spiriva Respimat 2.5 MCG/ACT Aers Generic drug: Tiotropium Bromide Monohydrate INHALE 2 SPRAY(S) BY MOUTH ONCE DAILY What changed:  See the new instructions.   Symbicort 160-4.5 MCG/ACT inhaler Generic drug: budesonide-formoterol Inhale 2 puffs into the lungs 2 (two) times daily.   traZODone 100 MG tablet Commonly known as: DESYREL Take 1 tablet (100 mg total) by mouth at bedtime. For sleep/anxiety       Discharge Assessment: Vitals:   03/05/20 0600 03/05/20 1025  BP: (!) 128/93   Pulse: 88   Resp: (!) 22   Temp:    SpO2: 98% 99%   Skin clean, dry and intact without evidence of skin break down, no evidence of skin tears noted. IV catheter discontinued intact. Site without signs and symptoms of complications - no redness or edema noted at insertion site, patient denies c/o pain - only slight tenderness at site.  Dressing with slight pressure applied.  D/c Instructions-Education: Discharge instructions given to patient/family with verbalized understanding. D/c education completed with patient/family including follow up instructions, medication list, d/c activities limitations if indicated, with other d/c instructions as indicated by MD - patient able to verbalize understanding, all questions fully answered. Patient instructed to return to ED, call 911, or call MD for any changes in condition.  Patient escorted via WC, and D/C home via private auto.  Jethro Poling, RN 03/05/2020 12:27 PM

## 2020-04-05 ENCOUNTER — Inpatient Hospital Stay: Payer: Medicaid Other | Admitting: Pulmonary Disease

## 2020-04-24 ENCOUNTER — Other Ambulatory Visit: Payer: Self-pay

## 2020-04-24 ENCOUNTER — Ambulatory Visit (INDEPENDENT_AMBULATORY_CARE_PROVIDER_SITE_OTHER): Payer: Medicaid Other | Admitting: Internal Medicine

## 2020-04-24 ENCOUNTER — Encounter: Payer: Self-pay | Admitting: Internal Medicine

## 2020-04-24 DIAGNOSIS — J449 Chronic obstructive pulmonary disease, unspecified: Secondary | ICD-10-CM | POA: Diagnosis not present

## 2020-04-24 DIAGNOSIS — F17210 Nicotine dependence, cigarettes, uncomplicated: Secondary | ICD-10-CM | POA: Diagnosis not present

## 2020-04-24 DIAGNOSIS — J9611 Chronic respiratory failure with hypoxia: Secondary | ICD-10-CM | POA: Diagnosis not present

## 2020-04-24 NOTE — Progress Notes (Signed)
Subjective:     Patient ID: Tony [name], male   DOB: 03-19-1961     MRN: [id number]    Brief patient profile:  59 yowm active smoker dx copd  2014 with onset of doe in 2010 and maint on advair/ albuterol referred to pulmonary clinic 05/20/2016 by [facility] by Kizzie Furnish, NP with GOLD IV criteria 05/20/16 and MS phenotype with nl alpha one AT levels documented 07/08/16       History of Present Illness  05/20/2016 1st [facility] office visit/ Season Astacio  maint rx advair 250 bid/ neb alb tid and saba hfa prn  Chief Complaint  Patient presents with  . Pulmonary Consult    Referred by Kizzie Furnish, NP. Pt c/o SOB since 2014, worse "for a while".   He gets winded just walking short distances such as walking to his back yard. He also c/o left side pain. He also c/o cough- prod with thick, clear to white sputum.    doe x MMRC3 = can't walk 100 yards even at a slow pace at a flat grade s stopping due to sob  Without much fluctuation  Has bilateral L CP  24/7 x 3 years but worse with coughing and when lying on L side  rec Plan A = Automatic = Symbicort 160 Take 2 puffs first thing in am and then another 2 puffs about 12 hours later.  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 He complains of worsen nonproductive cough without fever, chills, hemoptysis. Initial CXR showed atelectasis vs early interstitial pneumonia. The patient was started on IV solumedrol, Duonebs and antibiotics.    Discharge Diagnoses:  Acute respiratory failure with hypoxia -secondary to COPD exacerbation -presently stable on2L [place] -very little  functional reserve--dyspneic with minimal exertion -ambulatory pulse ox showed desaturation on RA -set up home oxygen--2L  COPD Exacerbation -continueDuoNebs q 4 hours during the hospitalzation -d/c spiriva -continue solu-medrol>>>prednisone taper over 2 weeks after d/c -continuepulmicort -continue ceftriaxone>>home with levofloxacin 2 days  -do not feel pt has pneumonia--urine strep pneumoniae antigen nonspecific -slow improvementeach day  Tobacco abuse -cessation discussed -nicotine patch  Poor Health Literacy -pt cannot read or write  Alcohol dependence -drinks up to 6 beers daily -CIWA -no signs of withdrawal     07/11/2017  f/u ov/Thurza Kwiecinski re:  Transition of care s/p aeCopd /newly on 02 2lpm 25/7  poor hfa and still smoking Chief Complaint  Patient presents with  . Follow-up    Pt states he has good days and bad days. On 07/09/17 and 07/10/17, pt states his breathing was a lot worse with the weather change. Breathing has been doing good today. Pt does have occ coughing but is unable to get any mucus up. Denies any CP.   overall about the same since d/c from hospital = [facility] = can't walk 100 yards even at a slow pace at a flat grade s stopping due to sob  Even on 02/ has difficulty with chest congestion/ rattling but what mucus he does bring up is mucoid, thick, not purulent worse in am's  but able to sleep ok on 2 pillows  No obvious patterns day to day or daytime variability or assoc   mucus plugs or hemoptysis or cp or chest tightness, subjective wheeze or overt sinus or hb symptoms. No unusual exposure hx or h/o childhood pna/ asthma or knowledge of premature birth.  Sleeping ok ok 2lpm on 2 pillows without nocturnal   exacerbation  of respiratory  c/o's or need for noct saba. Also denies any obvious fluctuation of symptoms with weather or environmental changes or other aggravating or alleviating factors except as outlined above  rec For cough > mucinex 1200 mg every 12  hour and flutter valve as much as possible The key is to stop smoking completely before smoking completely stops you!  Prevnar 13 and flu vaccines today  See your dentist asap  Plan A = Automatic = symbicort/ spiriva daily as you are  Plan B = Backup Only use your albuterol (proair) as a rescue medication  Plan C = Crisis - only use your albuterol nebulizer if you first try Plan B and it fails to help > ok to use the nebulizer up to every 4 hours but if start needing it regularly call for immediate appointment        01/05/2018  f/u ov/Dennies Coate re: copd gold iv/ 02 dep / still smoking on symb / spiriva smi  Chief Complaint  Patient presents with  . Follow-up    Breathing is overall doing well. He is using his albuterol inhaler 2-3 x per day and neb about once per day.   Dyspnea:  Food lion one ailse on 02 = MMRC3 = can't walk 100 yards even at a slow pace at a flat grade s stopping due to sob   Cough:  Better/ no am flare  SABA use: as above/ mostly when goes out in heat and over does it 02: 2lpm 24/7   rec The key is to stop smoking completely before smoking completely stops you!  Work on inhaler technique   07/08/2018  f/u ov/Rogerick Baldwin re: GOLD IV/ 02 dep but poor insight into how to use it /maint spiriva/ symbicort  Chief Complaint  Patient presents with  . Follow-up    Pt states his breathing is bad today b/c he is in our office and he feels hot. He is using his albuterol inhaler and neb both about once per wk "depends on the weather".   Dyspnea: 50 ft with or without 02 has to stop  Cough: rattling first thing am  Sleeping: flat in bed on side  SABA use: minimal  02: 2lpm at hs and at rest sitting per concentrator rec Plan A = Automatic = symbiocort and spiriva as you are Work on inhaler technique:  Plan B = Backup Only use your albuterol inhaler(Proventil)  as a rescue medication  Plan C = Crisis - only use your albuterol nebulizer if you first try Plan B and it fails to help >  ok to use the nebulizer up to every 4 hours but if start needing it regularly call for immediate appointment     01/11/2019  f/u ov/Alok Minshall re:  GOLD IV / 02 dep maint rx symb/spiriva much worse sob with mask use Chief Complaint  Patient presents with  . Follow-up  Dyspnea:  MMRC3 = can't walk 100 yards even at a slow pace at a flat grade s stopping due to sob / not using 02 with activity as rec  Cough: none  Sleeping: no  resp complaint  SABA use: way over using  02: only uses 02 3lpm hs / rarely using POC 2lpm  rec rec you monitor your 0xygen level any time you decide to take your 02 off any reason sitting vs walking with goal of keeping 02 level above 90% at all times Only use your albuterol as a rescue medication   Admission date:  03/03/2020  Admitting Physician  Levie Heritage, DO  Discharge Date:  03/05/2020   Primary MD  Selinda Flavin, MD  Recommendations for primary care physician for things to follow:   1)  please Avoid ibuprofen/Advil/Aleve/Motrin/Goody Powders/Naproxen/BC powders/Meloxicam/Diclofenac/Indomethacin and other Nonsteroidal anti-inflammatory medications as these will make you more likely to bleed and can cause stomach ulcers, can also cause Kidney problems.   2) complete abstinence from tobacco strongly advised--- you may use nicotine patch to help you quit smoking  3)you need oxygen at home at 2 L via nasal cannula continuously while awake and while asleep--- smoking or having open fires around oxygen can cause fire, significant injury and death  4) please note that there has been several changes to your medications  5) please follow-up with your primary care physician within a week for recheck and reevaluation   Admission Diagnosis  Paroxysmal atrial fibrillation (HCC) [I48.0] PAF (paroxysmal atrial fibrillation) (HCC) [I48.0] COPD exacerbation (HCC) [J44.1]   Discharge Diagnosis  Paroxysmal atrial fibrillation (HCC) [I48.0] PAF (paroxysmal  atrial fibrillation) (HCC) [I48.0] COPD exacerbation (HCC) [J44.1]    Principal Problem:   Acute on chronic respiratory failure with hypoxia (HCC) Active Problems:   COPD exacerbation (HCC)   Paroxysmal atrial fibrillation (HCC)   Cigarette smoker   Tobacco abuse   Chronic respiratory failure with hypoxia (HCC)          Past Medical History:  Diagnosis Date  . Asthma   . Bronchiolitis   . COPD (chronic obstructive pulmonary disease) (HCC)   . Emphysema   . Emphysema lung (HCC)   . Shortness of breath          Past Surgical History:  Procedure Laterality Date  . COLONOSCOPY N/A 08/17/2012   Procedure: COLONOSCOPY;  Surgeon: Corbin Ade, MD;  Location: [facility];  Service: Endoscopy;  Laterality: N/A;  9:30 AM  . CYST EXCISION Right    posterior neck- 59 years old     HPI  from the history and physical done on the day of admission:    Chief Complaint:SOB  JSE:GBTDV A Mitchellis a 59 y.o.malewith a history of COPD with chronic respiratory failure on home O2 2 L with ongoing tobacco abuse. Patient presents with shortness of breath, cough has been worsening over the past couple of days. Worse with ambulation and improved with rest. Cough is nonproductive. Patient came to the hospital for evaluation. He was noted to be in atrial fibrillation with rapid ventricular rate. He was given a breathing treatment and magnesium and his heart rate slowed. Patient's breathing is much improved and is having intermittent atrial fibrillation now. Has never been noted to have atrial fibrillation. No fevers, chills, nausea, vomiting.   Review of Systems:    Pt denies any fevers, chills, nausea, vomiting, diarrhea, constipation, abdominal pain, palpitations, headache, vision changes, lightheadedness, dizziness, melena, rectal bleeding. Review of systems are otherwise negative     Hospital Course:   Brief Summary:- 59 y.o.malewith a history of  COPD with chronic respiratory failure on home O2 2 L with ongoing tobacco abuseadmitted on 03/03/2020 with acute on chronic  hypoxic respiratory failure in the setting of paroxysmal atrial fibrillation with RVR as well as COPD exacerbation and noncompliance with oxygen at home due to the need to continue to smoke  A/p 1)acute on chronic hypoxic respiratory failure---due to COPD exacerbation and A. fib with RVR --He required up to 4 L of oxygen via nasal cannula,  PTA was on 2 L-- --currently patient is back to 2 L of oxygen which is his baseline -Patient was quite symptomatic with dyspnea, cough, and hypoxia -Treated with IV Solu-Medrol, doxycycline, mucolytics bronchodilators -Overall much improved, okay to discharge home on prednisone and doxycycline  2)Paroxysmal atrial fibrillation with RVR---- -echo with EF of 50 to 55%  -No significant valvular abnormalities  -TSH is 0.614 CHA2DS2- VASc score   is = 0 (zero)   Which is  equal to =0.2  % annual risk of stroke  Pt has No CHF, No DM2. No HTN and pt is 59 yrs old This patients CHA2DS2-VASc Score and unadjusted Ischemic Stroke Rate (% per year) is equal to 0.2 % stroke rate/year from a score of 0  3)Anxiety disorder the patient with history of tobacco and alcohol abuse--- xanax-low-dose as needed -Trazodone nightly -Nicotine patch as ordered Hydroxyzine as needed  4)generalized weakness and deconditioning--PT eval appreciated,no further PT follow-up advised  5)acute COPD exacerbation---contributing to #1 above -Manage as above #1      04/24/2020  f/u ov/[place] office/Ilai Hiller re: post hosp f/u / resumed smoking  Chief Complaint  Patient presents with  . Follow-up    shortness of breath with exertion  Dyspnea:  Food lion nl pace/ on 2lpm  Cough:minimal am  Sleeping: able to lie flat ok one  SABA use: sev times a day hfa/ not using neb 02: 2lpm 24/7    No obvious day to day or daytime variability or assoc excess/  purulent sputum or mucus plugs or hemoptysis or cp or chest tightness, subjective wheeze or overt sinus or hb symptoms.   Sleeping ok  without nocturnal  or early am exacerbation  of respiratory  c/o's or need for noct saba. Also denies any obvious fluctuation of symptoms with weather or environmental changes or other aggravating or alleviating factors except as outlined above   No unusual exposure hx or h/o childhood pna/ asthma or knowledge of premature birth.  Current Allergies, Complete Past Medical History, Past Surgical History, Family History, and Social History were reviewed in [facility] electronic medical record.  ROS  The following are not active complaints unless bolded Hoarseness, sore throat, dysphagia, dental problems, itching, sneezing,  nasal congestion or discharge of excess mucus or purulent secretions, ear ache,   fever, chills, sweats, unintended wt loss or wt gain, classically pleuritic or exertional cp,  orthopnea pnd or arm/hand swelling  or leg swelling, presyncope, palpitations, abdominal pain, anorexia, nausea, vomiting, diarrhea  or change in bowel habits or change in bladder habits, change in stools or change in urine, dysuria, hematuria,  rash, arthralgias, visual complaints, headache, numbness, weakness or ataxia or problems with walking or coordination,  change in mood or  memory.        Current Meds  Medication Sig  . albuterol (PROVENTIL) (2.5 MG/3ML) 0.083% nebulizer solution USE 1 VIAL IN NEBULIZER EVERY 4 HOURS AS NEEDED FOR WHEEZING OR SHORTNESS OF BREATH  . albuterol (VENTOLIN HFA) 108 (90 Base) MCG/ACT inhaler Inhale 2 puffs into the lungs every 4 (four) hours as needed for wheezing or shortness of breath. Shortness Of Breath  .  aspirin EC 81 MG tablet Take 1 tablet (81 mg total) by mouth daily with breakfast.  . diltiazem (CARDIZEM CD) 120 MG 24 hr capsule Take 1 capsule (120 mg total) by mouth daily.  . hydrOXYzine (ATARAX/VISTARIL) 25 MG tablet Take 1  tablet (25 mg total) by mouth 2 (two) times daily as needed for anxiety.  . nicotine (NICODERM CQ - DOSED IN MG/24 HOURS) 14 mg/24hr patch Place 1 patch (14 mg total) onto the skin daily.  Marland Kitchen omeprazole (PRILOSEC) 20 MG capsule Take 1 capsule (20 mg total) by mouth daily.  . OXYGEN O2 2LPM "When I am at home"  . predniSONE (DELTASONE) 20 MG tablet Take 2 tablets (40 mg total) by mouth daily with breakfast.  . Respiratory Therapy Supplies (FLUTTER) DEVI 1 Device by Does not apply route as directed.  . SYMBICORT 160-4.5 MCG/ACT inhaler Inhale 2 puffs into the lungs 2 (two) times daily.  . Tiotropium Bromide Monohydrate (SPIRIVA RESPIMAT) 2.5 MCG/ACT AERS INHALE 2 SPRAY(S) BY MOUTH ONCE DAILY                  Objective:   Physical Exam      04/24/2020         119  01/11/2019         115  10/06/2017        113  08/25/2017          114  07/08/2016        116  05/20/16 115 lb 6.4 oz (52.3 kg)  10/29/12 133 lb (60.3 kg)  08/17/12 128 lb (58.1 kg)    Vital signs reviewed  04/24/2020  - Note at rest 02 sats  98% on 2lpm pulsed    HEENT : pt wearing mask not removed for exam due to covid -19 concerns.    NECK :  without JVD/Nodes/TM/ nl carotid upstrokes bilaterally   LUNGS: no acc muscle use,  Mod barrel  contour chest wall with bilateral  Distant bs s audible wheeze and  without cough on insp or exp maneuvers and mod  Hyperresonant  to  percussion bilaterally     CV:  RRR  no s3 or murmur or increase in P2, and no edema   ABD:  soft and nontender with pos mid insp Hoover's  in the supine position. No bruits or organomegaly appreciated, bowel sounds nl  MS:     ext warm without deformities, calf tenderness, cyanosis or clubbing No obvious joint restrictions   SKIN: warm and dry without lesions    NEURO:  alert, approp, nl sensorium with  no motor or cerebellar deficits apparent.              Assessment:

## 2020-04-24 NOTE — Patient Instructions (Signed)
Make sure you check your oxygen saturations at highest level of activity to be sure it stays over 90% and adjust  02 flow upward to maintain this level if needed but remember to turn it back to previous settings when you stop (to conserve your supply).   The key is to stop smoking completely before smoking completely stops you!      Please schedule a follow up visit in 6  months but call sooner if needed

## 2020-04-25 ENCOUNTER — Encounter: Payer: Self-pay | Admitting: Internal Medicine

## 2020-04-25 NOTE — Assessment & Plan Note (Signed)
Active smoker 05/20/2016    try symbicort 160 2bid  - Spirometry 05/20/2016  FEV1 0.83 (24%)  Ratio 45 p am advair / neb   - PFT's  07/08/2016  FEV1 1.05 (29 % ) ratio 41  p 9 % improvement from saba p symb 160 x 2 prior to study with DLCO  50 % corrects to 56  % for alv volume   - 07/08/2016   > try symb 160/spiriva respimat 2.5   X 2 puff each am  - Alpha one AT screen 07/08/2016 >>  MS  Level 127 - 07/11/2017  After extensive coaching inhaler device  effectiveness =    75% from a baseline of 25% (Ti too short) - 07/11/2017 added flutter valve  - PFT's  08/25/2017  FEV1 0.87 (25 % ) ratio 38  p 4 % improvement from saba p symb/spiriva prior to study  - 01/11/2019  After extensive coaching inhaler device,  effectiveness =    75% (short Ti)   Group D in terms of symptom/risk and laba/lama/ICS  therefore appropriate rx at this point >>>  Symb/spiriva approp  I spent extra time with pt today reviewing appropriate use of albuterol for prn use on exertion with the following points: 1) saba is for relief of sob that does not improve by walking a slower pace or resting but rather if the pt does not improve after trying this first. 2) If the pt is convinced, as many are, that saba helps recover from activity faster then it's easy to tell if this is the case by re-challenging : ie stop, take the inhaler, then p 5 minutes try the exact same activity (intensity of workload) that just caused the symptoms and see if they are substantially diminished or not after saba 3) if there is an activity that reproducibly causes the symptoms, try the saba 15 min before the activity on alternate days   If in fact the saba really does help, then fine to continue to use it prn but advised may need to look closer at the maintenance regimen being used to achieve better control of airways disease with exertion.

## 2020-04-25 NOTE — Assessment & Plan Note (Signed)
Newly placed on 02 at d/c from cap admit  05/31/17 @ 2lpm 24/7  - 07/08/2018   Walked 2lpm poc x one lap =  approx 250 ft - stopped due to "don't want to go farther" but no desat or obvious wob "at the pace he wants to walk" = relatively slow  Advised re monitoring 02 sats with goals of > 90%

## 2020-04-25 NOTE — Assessment & Plan Note (Signed)
Counseled re importance of smoking cessation but did not meet time criteria for separate billing           Each maintenance medication was reviewed in detail including emphasizing most importantly the difference between maintenance and prns and under what circumstances the prns are to be triggered using an action plan format where appropriate.  Total time for H and P, chart review, counseling, reviewing hfa/smi/02 devices and generating customized AVS unique to this office visit / charting = 23 min

## 2020-05-29 ENCOUNTER — Emergency Department (HOSPITAL_COMMUNITY)
Admission: EM | Admit: 2020-05-29 | Discharge: 2020-05-29 | Disposition: A | Discharge status: Home / Self Care | Payer: Medicaid Other | Attending: Emergency Medicine | Admitting: Emergency Medicine

## 2020-05-29 DIAGNOSIS — Z7982 Long term (current) use of aspirin: Secondary | ICD-10-CM | POA: Diagnosis not present

## 2020-05-29 DIAGNOSIS — J441 Chronic obstructive pulmonary disease with (acute) exacerbation: Secondary | ICD-10-CM | POA: Diagnosis not present

## 2020-05-29 DIAGNOSIS — F17210 Nicotine dependence, cigarettes, uncomplicated: Secondary | ICD-10-CM | POA: Diagnosis not present

## 2020-05-29 DIAGNOSIS — J45909 Unspecified asthma, uncomplicated: Secondary | ICD-10-CM | POA: Insufficient documentation

## 2020-05-29 DIAGNOSIS — R040 Epistaxis: Secondary | ICD-10-CM | POA: Insufficient documentation

## 2020-05-29 LAB — BASIC METABOLIC PANEL
Anion gap: 6 (ref 5–15)
BUN: 18 mg/dL (ref 6–20)
CO2: 32 mmol/L (ref 22–32)
Calcium: 8.6 mg/dL — ABNORMAL LOW (ref 8.9–10.3)
Chloride: 101 mmol/L (ref 98–111)
Creatinine, Ser: 0.53 mg/dL — ABNORMAL LOW (ref 0.61–1.24)
GFR, Estimated: >60 mL/min (ref >60)
Glucose, Bld: 114 mg/dL — ABNORMAL HIGH (ref 70–99)
Potassium: 3.9 mmol/L (ref 3.5–5.1)
Sodium: 139 mmol/L (ref 135–145)

## 2020-05-29 LAB — CBC WITH DIFFERENTIAL/PLATELET
Abs Immature Granulocytes: 0.93 10*3/uL — ABNORMAL HIGH (ref 0.00–0.07)
Basophils Absolute: 0.1 10*3/uL (ref 0.0–0.1)
Basophils Relative: 1 %
Eosinophils Absolute: 0 10*3/uL (ref 0.0–0.5)
Eosinophils Relative: 0 %
HCT: 35.8 % — ABNORMAL LOW (ref 39.0–52.0)
Hemoglobin: 11.4 g/dL — ABNORMAL LOW (ref 13.0–17.0)
Immature Granulocytes: 4 %
Lymphocytes Relative: 5 %
Lymphs Abs: 1.3 10*3/uL (ref 0.7–4.0)
MCH: 29.5 pg (ref 26.0–34.0)
MCHC: 31.8 g/dL (ref 30.0–36.0)
MCV: 92.5 fL (ref 80.0–100.0)
Monocytes Absolute: 1.5 10*3/uL — ABNORMAL HIGH (ref 0.1–1.0)
Monocytes Relative: 7 %
Neutro Abs: 19.4 10*3/uL — ABNORMAL HIGH (ref 1.7–7.7)
Neutrophils Relative %: 83 %
Platelets: 335 10*3/uL (ref 150–400)
RBC: 3.87 MIL/uL — ABNORMAL LOW (ref 4.22–5.81)
RDW: 14.9 % (ref 11.5–15.5)
WBC: 23.3 10*3/uL — ABNORMAL HIGH (ref 4.0–10.5)
nRBC: 0 % (ref 0.0–0.2)

## 2020-05-29 MED ORDER — TRANEXAMIC ACID FOR EPISTAXIS
500.0000 mg | Freq: Once | TOPICAL | Status: AC
  Administered 2020-05-29: 500 mg via TOPICAL
  Filled 2020-05-29: qty 10

## 2020-05-29 MED ORDER — OXYMETAZOLINE HCL 0.05 % NA SOLN
1.0000 | Freq: Once | NASAL | Status: AC
  Administered 2020-05-29: 1 via NASAL
  Filled 2020-05-29: qty 30

## 2020-05-29 MED ORDER — LIDOCAINE HCL URETHRAL/MUCOSAL 2 % EX GEL
1.0000 "application " | Freq: Once | CUTANEOUS | Status: AC
  Administered 2020-05-29: 1 via TOPICAL

## 2020-05-29 NOTE — ED Triage Notes (Signed)
Per pt massive nosebleed began around 1pm this afternoon, got better, but worsened around 5p. Pt has blood all over clothes and shoes. Baseline pt is on 2L [place] and says his nose has been stuffy.

## 2020-05-29 NOTE — ED Provider Notes (Signed)
[facility] EMERGENCY DEPARTMENT Provider Note   CSN: [id number] Arrival date & time: 05/29/20  1949     History Chief Complaint  Patient presents with  . Epistaxis    Tony [name] is a 59 y.o. male.  HPI      Tony [name] is a 59 y.o. male, with a history of COPD on chronic oxygen, asthma, presenting to the ED with nosebleed beginning spontaneously while at rest around 5 PM this evening. He states he began to have bleeding out of both nares simultaneously. Denies anticoagulation. He is on chronic supplemental O2. Denies fever/chills, new cough, shortness of breath, chest pain, choking, dizziness, headache, facial pain, trauma, or any other complaints.  Past Medical History:  Diagnosis Date  . Asthma   . Bronchiolitis   . COPD (chronic obstructive pulmonary disease) (HCC)   . Emphysema   . Emphysema lung (HCC)   . Shortness of breath     Patient Active Problem List   Diagnosis Date Noted  . Paroxysmal atrial fibrillation (HCC) 03/03/2020  . Chronic respiratory failure with hypoxia (HCC) 07/12/2017  . Poor dentition 07/12/2017  . Alcohol dependence (HCC) 05/29/2017  . Acute respiratory failure with hypoxia (HCC) 05/28/2017  . COPD exacerbation (HCC) 05/26/2017  . Acute on chronic respiratory failure with hypoxia (HCC) 05/26/2017  . CAP (community acquired pneumonia) 05/26/2017  . Cigarette smoker 05/20/2016  . 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 COPD GOLD IV / still smoking  07/31/2012  . Hypokalemia 07/31/2012  . Dyspnea 07/31/2012  . Tobacco abuse 07/31/2012  . Weight loss 07/31/2012  . OPEN FRACTURE DISTAL PHALANX OR PHALANGES HAND 07/06/2007    Past Surgical History:  Procedure Laterality Date  . COLONOSCOPY N/A 08/17/2012   Procedure: COLONOSCOPY;  Surgeon: Corbin Ade, MD;  Location: [facility];  Service: Endoscopy;  Laterality: N/A;  9:30 AM  . CYST EXCISION Right    posterior neck- 59 years old        Family History  Problem Relation Age of Onset  . Cancer Mother   . COPD Brother     Social History   Tobacco Use  . Smoking status: Current Every Day Smoker    Packs/day: 0.50    Years: 40.00    Pack years: 20.00    Types: Cigarettes  . Smokeless tobacco: Never Used  . Tobacco comment: 1 cigarette per day 04/24/2020  Vaping Use  . Vaping Use: Never used  Substance Use Topics  . Alcohol use: No  . Drug use: No    Home Medications Prior to Admission medications   Medication Sig Start Date End Date Taking? Authorizing Provider  aspirin 81 MG EC tablet Take by mouth. 03/05/20  Yes [provider]  aspirin EC 81 MG tablet Take 1 tablet (81 mg total) by mouth daily with breakfast. 03/05/20  Yes Emokpae, Courage, MD  diltiazem (TIAZAC) 120 MG 24 hr capsule Take by mouth. 03/07/20  Yes [provider]  hydrOXYzine (ATARAX/VISTARIL) 25 MG tablet Take 1 tablet (25 mg total) by mouth 2 (two) times daily as needed for anxiety. 03/05/20  Yes Shon Hale, MD  LORazepam (ATIVAN) 0.5 MG tablet  04/20/20  Yes [provider]  mometasone-formoterol (DULERA) 200-5 MCG/ACT AERO Inhale into the lungs. 04/05/20  Yes [provider]  OXYGEN O2 2LPM "When I am at home"   Yes [provider]  SYMBICORT 160-4.5 MCG/ACT inhaler Inhale 2 puffs into the lungs 2 (  two) times daily. 03/05/20  Yes Emokpae, Courage, MD  Tiotropium Bromide Monohydrate (SPIRIVA RESPIMAT) 2.5 MCG/ACT AERS INHALE 2 SPRAY(S) BY MOUTH ONCE DAILY 03/05/20  Yes Emokpae, Courage, MD  traMADol (ULTRAM) 50 MG tablet Take by mouth. 03/27/20  Yes [provider]  albuterol (PROVENTIL) (2.5 MG/3ML) 0.083% nebulizer solution USE 1 VIAL IN NEBULIZER EVERY 4 HOURS AS NEEDED FOR WHEEZING OR SHORTNESS OF BREATH 03/05/20   Emokpae, Courage, MD  albuterol (VENTOLIN HFA) 108 (90 Base) MCG/ACT inhaler Inhale 2 puffs into the lungs every 4 (four) hours as needed for wheezing or shortness of  breath. Shortness Of Breath 03/05/20   Emokpae, Courage, MD  DILT-XR 120 MG 24 hr capsule Take 120 mg by mouth daily. 03/27/20   [provider]  diltiazem (CARDIZEM CD) 120 MG 24 hr capsule Take 1 capsule (120 mg total) by mouth daily. 03/05/20 03/05/21  Shon Hale, MD  levofloxacin (LEVAQUIN) 750 MG tablet Take 750 mg by mouth daily. 03/31/20   [provider]  nicotine (NICODERM CQ - DOSED IN MG/24 HOURS) 14 mg/24hr patch Place 1 patch (14 mg total) onto the skin daily. Patient not taking: Reported on 05/29/2020 03/05/20   Shon Hale, MD  omeprazole (PRILOSEC) 20 MG capsule Take 1 capsule (20 mg total) by mouth daily. 03/05/20 03/05/21  Shon Hale, MD  predniSONE (DELTASONE) 20 MG tablet Take 2 tablets (40 mg total) by mouth daily with breakfast. 03/05/20   Shon Hale, MD  Respiratory Therapy Supplies (FLUTTER) DEVI 1 Device by Does not apply route as directed. 07/11/17   Nyoka Cowden, MD  traZODone (DESYREL) 100 MG tablet Take 100 mg by mouth at bedtime. 05/26/20   [provider]    Allergies    Bee venom  Review of Systems   Review of Systems  Constitutional: Negative for chills and fever.  HENT: Positive for nosebleeds. Negative for facial swelling, sore throat, trouble swallowing and voice change.   Respiratory: Negative for cough and shortness of breath.   Cardiovascular: Negative for chest pain.  Gastrointestinal: Negative for nausea and vomiting.  Neurological: Negative for dizziness, syncope, weakness and headaches.  All other systems reviewed and are negative.   Physical Exam Updated Vital Signs BP (!) 159/107 (BP Location: Right Arm)   Pulse (!) 110   Temp 98 F (36.7 C) (Oral)   Resp (!) 22   Ht 5\' 7"  (1.702 m)   Wt 54.4 kg   SpO2 100%   BMI 18.79 kg/m   Physical Exam Vitals and nursing note reviewed.  Constitutional:      General: He is not in acute distress.    Appearance: He is well-developed. He is not  diaphoretic.  HENT:     Head: Normocephalic and atraumatic.     Nose:     Comments: Clotting blood in both nares.  Seems to be steady bleeding out of both nares. No facial tenderness, swelling, or deformity.  After having patient apply pressure for about 10 minutes, he was reexamined there is no evidence of hemorrhage on the right, but a small amount of hemorrhage on the left.    Mouth/Throat:     Mouth: Mucous membranes are moist.     Pharynx: Oropharynx is clear.     Comments: Some blood in the posterior pharynx, but no noted free-flowing hemorrhage. Eyes:     Conjunctiva/sclera: Conjunctivae normal.  Cardiovascular:     Rate and Rhythm: Normal rate and regular rhythm.     Pulses: Normal  pulses.          Radial pulses are 2+ on the right side and 2+ on the left side.     Heart sounds: Normal heart sounds.  Pulmonary:     Effort: Pulmonary effort is normal. No respiratory distress.     Breath sounds: Normal breath sounds.  Abdominal:     Palpations: Abdomen is soft.     Tenderness: There is no abdominal tenderness. There is no guarding.  Musculoskeletal:     Cervical back: Neck supple.  Lymphadenopathy:     Cervical: No cervical adenopathy.  Skin:    General: Skin is warm and dry.  Neurological:     Mental Status: He is alert.  Psychiatric:        Mood and Affect: Mood and affect normal.        Speech: Speech normal.        Behavior: Behavior normal.     ED Results / Procedures / Treatments   Labs (all labs ordered are listed, but only abnormal results are displayed) Labs Reviewed  BASIC METABOLIC PANEL  CBC WITH DIFFERENTIAL/PLATELET    EKG None  Radiology No results found.  Procedures .Epistaxis Management  Date/Time: 05/29/2020 8:38 PM Performed by: Anselm Pancoast, PA-C Authorized by: Anselm Pancoast, PA-C   Consent:    Consent obtained:  Verbal   Consent given by:  Patient   Risks discussed:  Bleeding, infection, nasal injury and pain Anesthesia (see  MAR for exact dosages):    Anesthesia method:  Topical application   Topical anesthetic:  Lidocaine gel Procedure details:    Treatment site:  L posterior   Repair method: Rhino Rocket 7.5 cm.   Treatment complexity:  Extensive   Treatment episode: initial   Post-procedure details:    Assessment:  Bleeding stopped   Patient tolerance of procedure:  Tolerated well, no immediate complications Comments:     TXA was applied to the [employer] prior to placement.   (including critical care time)  Medications Ordered in ED Medications  oxymetazoline (AFRIN) 0.05 % nasal spray 1 spray (1 spray Each Nare Given 05/29/20 2013)  lidocaine (XYLOCAINE) 2 % jelly 1 application (1 application Topical Given 05/29/20 2045)  tranexamic acid (CYKLOKAPRON) 1000 MG/10ML topical solution 500 mg (500 mg Topical Given 05/29/20 2044)    ED Course  I have reviewed the triage vital signs and the nursing notes.  Pertinent labs & imaging results that were available during my care of the patient were reviewed by me and considered in my medical decision making (see chart for details).    MDM Rules/Calculators/A&P                          Patient presents with atraumatic epistaxis. Multiple rounds of direct pressure were attempted, Afrin applied, each without permanent success in hemostasis. Rhino Rocket placed in the left nare since on subsequent exam this seemed to be the most likely source of bleeding.  Due to the bleeding at one point out of both nares, it was thought patient may have posterior bleed.  Findings and plan of care discussed with Adalberto Cole, MD. Dr. Jeraldine Loots personally evaluated and examined this patient. Dr. Jeraldine Loots continued to care for this patient after the end of my shift.   Final Clinical Impression(s) / ED Diagnoses Final diagnoses:  None    Rx / DC Orders ED Discharge Orders    None  Concepcion LivingJoy, Brittney Mucha C, PA-C 05/29/20 2108    Gerhard MunchLockwood, Robert, MD 05/29/20 2217

## 2020-05-29 NOTE — Discharge Instructions (Addendum)
As discussed, it is important that you monitor your condition carefully and do not hesitate to return here for concerning changes. Otherwise, follow-up with our ENT colleagues in 3 days for consideration of removal of the packing device.

## 2020-10-30 ENCOUNTER — Telehealth: Payer: Self-pay | Admitting: Internal Medicine

## 2020-10-30 ENCOUNTER — Ambulatory Visit: Payer: Medicaid Other | Admitting: Internal Medicine

## 2020-10-30 NOTE — Telephone Encounter (Signed)
Put him in for televisit for 830 am 10/31/20

## 2020-10-30 NOTE — Telephone Encounter (Signed)
MW please advise if you're ok with a televist or to be worked in. Thanks!

## 2020-10-30 NOTE — Telephone Encounter (Signed)
Spoke with sister Talbert Forest (dpr on file), offered televisit tomorrow at 8:30. Pt and sister declined, stating that they prefer an in-person visit. Offered an appt with MW in [place], pt and sister declined. Offered an appt with another provider in [place], pt and sister declined. Sister scheduled appt on 6/20 as previously mentioned. Nothing further needed- forwarding to MW as Lorain Childes.

## 2020-10-30 NOTE — Progress Notes (Deleted)
Subjective:     Patient ID: Tony [name], male   DOB: 03-19-1961     MRN: [id number]    Brief patient profile:  60 yowm active smoker dx copd  2014 with onset of doe in 2010 and maint on advair/ albuterol referred to pulmonary clinic 05/20/2016 by [facility] by Kizzie Furnish, NP with GOLD IV criteria 05/20/16 and MS phenotype with nl alpha one AT levels documented 07/08/16       History of Present Illness  05/20/2016 1st [facility] office visit/ Ala Kratz  maint rx advair 250 bid/ neb alb tid and saba hfa prn  Chief Complaint  Patient presents with  . Pulmonary Consult    Referred by Kizzie Furnish, NP. Pt c/o SOB since 2014, worse "for a while".   He gets winded just walking short distances such as walking to his back yard. He also c/o left side pain. He also c/o cough- prod with thick, clear to white sputum.    doe x MMRC3 = can't walk 100 yards even at a slow pace at a flat grade s stopping due to sob  Without much fluctuation  Has bilateral L CP  24/7 x 3 years but worse with coughing and when lying on L side  rec Plan A = Automatic = Symbicort 160 Take 2 puffs first thing in am and then another 2 puffs about 12 hours later.  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 He complains of worsen nonproductive cough without fever, chills, hemoptysis. Initial CXR showed atelectasis vs early interstitial pneumonia. The patient was started on IV solumedrol, Duonebs and antibiotics.    Discharge Diagnoses:  Acute respiratory failure with hypoxia -secondary to COPD exacerbation -presently stable on2L [place] -very little  functional reserve--dyspneic with minimal exertion -ambulatory pulse ox showed desaturation on RA -set up home oxygen--2L  COPD Exacerbation -continueDuoNebs q 4 hours during the hospitalzation -d/c spiriva -continue solu-medrol>>>prednisone taper over 2 weeks after d/c -continuepulmicort -continue ceftriaxone>>home with levofloxacin 2 days  -do not feel pt has pneumonia--urine strep pneumoniae antigen nonspecific -slow improvementeach day  Tobacco abuse -cessation discussed -nicotine patch  Poor Health Literacy -pt cannot read or write  Alcohol dependence -drinks up to 6 beers daily -CIWA -no signs of withdrawal     07/11/2017  f/u ov/Gabrielly Mccrystal re:  Transition of care s/p aeCopd /newly on 02 2lpm 25/7  poor hfa and still smoking Chief Complaint  Patient presents with  . Follow-up    Pt states he has good days and bad days. On 07/09/17 and 07/10/17, pt states his breathing was a lot worse with the weather change. Breathing has been doing good today. Pt does have occ coughing but is unable to get any mucus up. Denies any CP.   overall about the same since d/c from hospital = [facility] = can't walk 100 yards even at a slow pace at a flat grade s stopping due to sob  Even on 02/ has difficulty with chest congestion/ rattling but what mucus he does bring up is mucoid, thick, not purulent worse in am's  but able to sleep ok on 2 pillows  No obvious patterns day to day or daytime variability or assoc   mucus plugs or hemoptysis or cp or chest tightness, subjective wheeze or overt sinus or hb symptoms. No unusual exposure hx or h/o childhood pna/ asthma or knowledge of premature birth.  Sleeping ok ok 2lpm on 2 pillows without nocturnal   exacerbation  of respiratory  c/o's or need for noct saba. Also denies any obvious fluctuation of symptoms with weather or environmental changes or other aggravating or alleviating factors except as outlined above  rec For cough > mucinex 1200 mg every 12  hour and flutter valve as much as possible The key is to stop smoking completely before smoking completely stops you!  Prevnar 13 and flu vaccines today  See your dentist asap  Plan A = Automatic = symbicort/ spiriva daily as you are  Plan B = Backup Only use your albuterol (proair) as a rescue medication  Plan C = Crisis - only use your albuterol nebulizer if you first try Plan B and it fails to help > ok to use the nebulizer up to every 4 hours but if start needing it regularly call for immediate appointment        01/05/2018  f/u ov/Haidyn Chadderdon re: copd gold iv/ 02 dep / still smoking on symb / spiriva smi  Chief Complaint  Patient presents with  . Follow-up    Breathing is overall doing well. He is using his albuterol inhaler 2-3 x per day and neb about once per day.   Dyspnea:  Food lion one ailse on 02 = MMRC3 = can't walk 100 yards even at a slow pace at a flat grade s stopping due to sob   Cough:  Better/ no am flare  SABA use: as above/ mostly when goes out in heat and over does it 02: 2lpm 24/7   rec The key is to stop smoking completely before smoking completely stops you!  Work on inhaler technique   07/08/2018  f/u ov/Lauree Yurick re: GOLD IV/ 02 dep but poor insight into how to use it /maint spiriva/ symbicort  Chief Complaint  Patient presents with  . Follow-up    Pt states his breathing is bad today b/c he is in our office and he feels hot. He is using his albuterol inhaler and neb both about once per wk "depends on the weather".   Dyspnea: 50 ft with or without 02 has to stop  Cough: rattling first thing am  Sleeping: flat in bed on side  SABA use: minimal  02: 2lpm at hs and at rest sitting per concentrator rec Plan A = Automatic = symbiocort and spiriva as you are Work on inhaler technique:  Plan B = Backup Only use your albuterol inhaler(Proventil)  as a rescue medication  Plan C = Crisis - only use your albuterol nebulizer if you first try Plan B and it fails to help >  ok to use the nebulizer up to every 4 hours but if start needing it regularly call for immediate appointment     01/11/2019  f/u ov/Dilynn Munroe re:  GOLD IV / 02 dep maint rx symb/spiriva much worse sob with mask use Chief Complaint  Patient presents with  . Follow-up  Dyspnea:  MMRC3 = can't walk 100 yards even at a slow pace at a flat grade s stopping due to sob / not using 02 with activity as rec  Cough: none  Sleeping: no  resp complaint  SABA use: way over using  02: only uses 02 3lpm hs / rarely using POC 2lpm  rec rec you monitor your 0xygen level any time you decide to take your 02 off any reason sitting vs walking with goal of keeping 02 level above 90% at all times Only use your albuterol as a rescue medication   Admission date:  03/03/2020  Admitting Physician  Levie Heritage, DO  Discharge Date:  03/05/2020   Primary MD  Selinda Flavin, MD  Recommendations for primary care physician for things to follow:   1)  please Avoid ibuprofen/Advil/Aleve/Motrin/Goody Powders/Naproxen/BC powders/Meloxicam/Diclofenac/Indomethacin and other Nonsteroidal anti-inflammatory medications as these will make you more likely to bleed and can cause stomach ulcers, can also cause Kidney problems.   2) complete abstinence from tobacco strongly advised--- you may use nicotine patch to help you quit smoking  3)you need oxygen at home at 2 L via nasal cannula continuously while awake and while asleep--- smoking or having open fires around oxygen can cause fire, significant injury and death  4) please note that there has been several changes to your medications  5) please follow-up with your primary care physician within a week for recheck and reevaluation   Admission Diagnosis  Paroxysmal atrial fibrillation (HCC) [I48.0] PAF (paroxysmal atrial fibrillation) (HCC) [I48.0] COPD exacerbation (HCC) [J44.1]   Discharge Diagnosis  Paroxysmal atrial fibrillation (HCC) [I48.0] PAF (paroxysmal  atrial fibrillation) (HCC) [I48.0] COPD exacerbation (HCC) [J44.1]    Principal Problem:   Acute on chronic respiratory failure with hypoxia (HCC) Active Problems:   COPD exacerbation (HCC)   Paroxysmal atrial fibrillation (HCC)   Cigarette smoker   Tobacco abuse   Chronic respiratory failure with hypoxia (HCC)          Past Medical History:  Diagnosis Date  . Asthma   . Bronchiolitis   . COPD (chronic obstructive pulmonary disease) (HCC)   . Emphysema   . Emphysema lung (HCC)   . Shortness of breath          Past Surgical History:  Procedure Laterality Date  . COLONOSCOPY N/A 08/17/2012   Procedure: COLONOSCOPY;  Surgeon: Corbin Ade, MD;  Location: [facility];  Service: Endoscopy;  Laterality: N/A;  9:30 AM  . CYST EXCISION Right    posterior neck- 60 years old     HPI  from the history and physical done on the day of admission:    Chief Complaint:SOB  JSE:GBTDV A Mitchellis a 59 y.o.malewith a history of COPD with chronic respiratory failure on home O2 2 L with ongoing tobacco abuse. Patient presents with shortness of breath, cough has been worsening over the past couple of days. Worse with ambulation and improved with rest. Cough is nonproductive. Patient came to the hospital for evaluation. He was noted to be in atrial fibrillation with rapid ventricular rate. He was given a breathing treatment and magnesium and his heart rate slowed. Patient's breathing is much improved and is having intermittent atrial fibrillation now. Has never been noted to have atrial fibrillation. No fevers, chills, nausea, vomiting.   Review of Systems:    Pt denies any fevers, chills, nausea, vomiting, diarrhea, constipation, abdominal pain, palpitations, headache, vision changes, lightheadedness, dizziness, melena, rectal bleeding. Review of systems are otherwise negative     Hospital Course:   Brief Summary:- 60 y.o.malewith a history of  COPD with chronic respiratory failure on home O2 2 L with ongoing tobacco abuseadmitted on 03/03/2020 with acute on chronic  hypoxic respiratory failure in the setting of paroxysmal atrial fibrillation with RVR as well as COPD exacerbation and noncompliance with oxygen at home due to the need to continue to smoke  A/p 1)acute on chronic hypoxic respiratory failure---due to COPD exacerbation and A. fib with RVR --He required up to 4 L of oxygen via nasal cannula,  PTA was on 2 L-- --currently patient is back to 2 L of oxygen which is his baseline -Patient was quite symptomatic with dyspnea, cough, and hypoxia -Treated with IV Solu-Medrol, doxycycline, mucolytics bronchodilators -Overall much improved, okay to discharge home on prednisone and doxycycline  2)Paroxysmal atrial fibrillation with RVR---- -echo with EF of 50 to 55%  -No significant valvular abnormalities  -TSH is 0.614 CHA2DS2- VASc score   is = 0 (zero)   Which is  equal to =0.2  % annual risk of stroke  Pt has No CHF, No DM2. No HTN and pt is 60 yrs old This patients CHA2DS2-VASc Score and unadjusted Ischemic Stroke Rate (% per year) is equal to 0.2 % stroke rate/year from a score of 0  3)Anxiety disorder the patient with history of tobacco and alcohol abuse--- xanax-low-dose as needed -Trazodone nightly -Nicotine patch as ordered Hydroxyzine as needed  4)generalized weakness and deconditioning--PT eval appreciated,no further PT follow-up advised  5)acute COPD exacerbation---contributing to #1 above -Manage as above #1      04/24/2020  f/u ov/[place] office/Estephany Perot re: post hosp f/u / resumed smoking  Chief Complaint  Patient presents with  . Follow-up    shortness of breath with exertion  Dyspnea:  Food lion nl pace/ on 2lpm  Cough:minimal am  Sleeping: able to lie flat ok one  SABA use: sev times a day hfa/ not using neb 02: 2lpm 24/7  rec Make sure you check your oxygen saturations at highest level  of activity  The key is to stop smoking completely before smoking completely stops you!   10/30/2020  f/u ov/Lena Gores re:  No chief complaint on file.   Dyspnea:  *** Cough: *** Sleeping: *** SABA use: *** 02: *** Covid status:   ***   No obvious day to day or daytime variability or assoc excess/ purulent sputum or mucus plugs or hemoptysis or cp or chest tightness, subjective wheeze or overt sinus or hb symptoms.   *** without nocturnal  or early am exacerbation  of respiratory  c/o's or need for noct saba. Also denies any obvious fluctuation of symptoms with weather or environmental changes or other aggravating or alleviating factors except as outlined above   No unusual exposure hx or h/o childhood pna/ asthma or knowledge of premature birth.  Current Allergies, Complete Past Medical History, Past Surgical History, Family History, and Social History were reviewed in [employer] record.  ROS  The following are not active complaints unless bolded Hoarseness, sore throat, dysphagia, dental problems, itching, sneezing,  nasal congestion or discharge of excess mucus or purulent secretions, ear ache,   fever, chills, sweats, unintended wt loss or wt gain, classically pleuritic or exertional cp,  orthopnea pnd or arm/hand swelling  or leg swelling, presyncope, palpitations, abdominal pain, anorexia, nausea, vomiting, diarrhea  or change in bowel habits or change in bladder habits, change in stools or change in urine, dysuria, hematuria,  rash, arthralgias, visual complaints, headache, numbness, weakness or ataxia or problems with walking or coordination,  change in mood or  memory.        No outpatient medications have been marked as  taking for the 10/30/20 encounter (Appointment) with Nyoka Cowden, MD.                    Objective:   Physical Exam     10/30/2020           *** 04/24/2020         119  01/11/2019         115  10/06/2017        113  08/25/2017           114  07/08/2016        116  05/20/16 115 lb 6.4 oz (52.3 kg)  10/29/12 133 lb (60.3 kg)  08/17/12 128 lb (58.1 kg)      Vital signs reviewed  10/30/2020  - Note at rest 02 sats  ***% on ***   General appearance:    ***       Mod bar***         Assessment:

## 2020-12-11 ENCOUNTER — Encounter: Payer: Self-pay | Admitting: Internal Medicine

## 2020-12-11 ENCOUNTER — Other Ambulatory Visit: Payer: Self-pay

## 2020-12-11 ENCOUNTER — Ambulatory Visit (INDEPENDENT_AMBULATORY_CARE_PROVIDER_SITE_OTHER): Payer: Medicaid Other | Admitting: Internal Medicine

## 2020-12-11 DIAGNOSIS — J9611 Chronic respiratory failure with hypoxia: Secondary | ICD-10-CM | POA: Diagnosis not present

## 2020-12-11 DIAGNOSIS — F17210 Nicotine dependence, cigarettes, uncomplicated: Secondary | ICD-10-CM | POA: Diagnosis not present

## 2020-12-11 DIAGNOSIS — J449 Chronic obstructive pulmonary disease, unspecified: Secondary | ICD-10-CM | POA: Diagnosis not present

## 2020-12-11 MED ORDER — AZITHROMYCIN 250 MG PO TABS: ORAL_TABLET | ORAL | 0 refills | Status: DC

## 2020-12-11 NOTE — Patient Instructions (Signed)
The key is to stop smoking completely before smoking completely stops you!   Work on inhaler technique:  relax and gently blow all the way out then take a nice smooth full deep breath back in, triggering the inhaler at same time you start breathing in.  Hold for up to 5 seconds if you can. Blow out thru nose. Rinse and gargle with water when done.  If mouth or throat bother you at all,  try brushing teeth/gums/tongue with arm and hammer toothpaste/ make a slurry and gargle and spit out.    Make sure you check your oxygen saturation  at your highest level of activity  to be sure it stays over 90% and adjust  02 flow upward to maintain this level if needed but remember to turn it back to previous settings when you stop (to conserve your supply).     Zpak    Please schedule a follow up visit in 6 months but call sooner if needed

## 2020-12-11 NOTE — Progress Notes (Signed)
Subjective:     Patient ID: Tony [name], male   DOB: [birth year]     MRN: [id number]    Brief patient profile:  60 yowm active smoker dx copd  2014 with onset of doe in 2010 and maint on advair/ albuterol referred to pulmonary clinic 05/20/2016 by [facility] by Kizzie Furnish, NP with GOLD IV criteria 05/20/16 and MS phenotype with nl alpha one AT levels documented 07/08/16       History of Present Illness  05/20/2016 1st [facility] office visit/ Tony [name]  maint rx advair 250 bid/ neb alb tid and saba hfa prn  Chief Complaint  Patient presents with   Pulmonary Consult    Referred by Kizzie Furnish, NP. Pt c/o SOB since 2014, worse "for a while".   He gets winded just walking short distances such as walking to his back yard. He also c/o left side pain. He also c/o cough- prod with thick, clear to white sputum.    doe x MMRC3 = can't walk 100 yards even at a slow pace at a flat grade s stopping due to sob  Without much fluctuation  Has bilateral L CP  24/7 x 3 years but worse with coughing and when lying on L side  rec Plan A = Automatic = Symbicort 160 Take 2 puffs first thing in am and then another 2 puffs about 12 hours later.  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  He complains of worsen nonproductive cough without fever, chills, hemoptysis.  Initial CXR showed atelectasis vs early interstitial pneumonia.  The patient was started on IV solumedrol, Duonebs and antibiotics.       Discharge Diagnoses:  Acute respiratory failure with hypoxia -secondary to COPD exacerbation -presently stable on 2L [place] -very little  functional reserve--dyspneic with minimal exertion -ambulatory pulse ox showed desaturation on RA -set up home oxygen--2L   COPD Exacerbation -continue DuoNebs q 4 hours during the hospitalzation -d/c spiriva -continue solu-medrol>>>prednisone taper over 2 weeks after d/c -continue pulmicort -continue ceftriaxone>>home with levofloxacin 2 days  -do not feel pt has pneumonia--urine strep pneumoniae antigen nonspecific -slow improvement each day   Tobacco abuse -cessation discussed -nicotine patch   Poor Health Literacy -pt cannot read or write   Alcohol dependence -drinks up to 6 beers daily -CIWA -no signs of withdrawal      07/11/2017  f/u ov/Tony [name] re:  Transition of care s/p aeCopd /newly on 02 2lpm 25/7  poor hfa and still smoking Chief Complaint  Patient presents with   Follow-up    Pt states he has good days and bad days. On 07/09/17 and 07/10/17, pt states his breathing was a lot worse with the weather change. Breathing has been doing good today. Pt does have occ coughing but is unable to get any mucus up. Denies any CP.   overall about the same since d/c from hospital = [name] = can't walk 100 yards even at a slow pace at a flat grade s stopping due to sob  Even on 02/ has difficulty with  chest congestion/ rattling but what mucus he does bring up is mucoid, thick, not purulent worse in am's but able to sleep ok on 2 pillows  No obvious patterns day to day or daytime variability or assoc   mucus plugs or hemoptysis or cp or chest tightness, subjective wheeze or overt sinus or hb symptoms. No unusual exposure hx or h/o childhood pna/ asthma or knowledge of premature birth.  Sleeping ok ok 2lpm on 2 pillows without nocturnal   exacerbation  of respiratory  c/o's or need for noct saba. Also denies any obvious fluctuation of symptoms with weather or environmental changes or other aggravating or alleviating factors except as outlined above  rec For cough > mucinex 1200 mg every 12  hour and flutter valve as much as possible The key is to stop smoking completely before smoking completely stops you!  Prevnar 13 and flu vaccines today  See your dentist asap  Plan A = Automatic = symbicort/ spiriva daily as you are  Plan B = Backup Only use your albuterol (proair) as a rescue medication  Plan C = Crisis - only use your albuterol nebulizer if you first try Plan B and it fails to help > ok to use the nebulizer up to every 4 hours but if start needing it regularly call for immediate appointment        01/05/2018  f/u ov/Tony [name] re: copd gold iv/ 02 dep / still smoking on symb / spiriva smi  Chief Complaint  Patient presents with   Follow-up    Breathing is overall doing well. He is using his albuterol inhaler 2-3 x per day and neb about once per day.   Dyspnea:  [employer] one ailse on 02 = MMRC3 = can't walk 100 yards even at a slow pace at a flat grade s stopping due to sob   Cough:  Better/ no am flare  SABA use: as above/ mostly when goes out in heat and over does it 02: 2lpm 24/7   rec The key is to stop smoking completely before smoking completely stops you!  Work on inhaler technique   07/08/2018  f/u ov/Tony [name] re: GOLD IV/ 02 dep but poor insight into how to use it /maint spiriva/ symbicort  Chief Complaint  Patient presents with   Follow-up    Pt states his breathing is bad today b/c he is in our office and he feels hot. He is using his albuterol inhaler and neb both about once per wk "depends on the weather".   Dyspnea: 50 ft with or without 02 has to stop  Cough: rattling first thing am  Sleeping: flat in bed on side  SABA use: minimal  02: 2lpm at hs and at rest sitting per concentrator rec Plan A = Automatic = symbiocort and spiriva as you are Work on inhaler technique:  Plan B = Backup Only use your albuterol inhaler(Proventil)  as a rescue medication  Plan C = Crisis - only use your albuterol nebulizer if you first try Plan B and it fails to help > ok  to use the nebulizer up to every 4 hours but if start needing it regularly call for immediate appointment     01/11/2019  f/u ov/Tony [name] re:  GOLD IV / 02 dep maint rx symb/spiriva much worse sob with mask use Chief Complaint  Patient presents with   Follow-up  Dyspnea:  MMRC3 = can't walk 100 yards even at a slow pace at a flat grade s  stopping due to sob / not using 02 with activity as rec  Cough: none  Sleeping: no resp complaint  SABA use: way over using  02: only uses 02 3lpm hs / rarely using POC 2lpm  rec rec you monitor your 0xygen level any time you decide to take your 02 off any reason sitting vs walking with goal of keeping 02 level above 90% at all times Only use your albuterol as a rescue medication   Admission date:  03/03/2020  Admitting Physician  Levie HeritageJacob J Stinson, DO   Discharge Date:  03/05/2020    Primary MD  Selinda FlavinHoward, Kevin, MD   Recommendations for primary care physician for things to follow:    1)  please Avoid ibuprofen/Advil/Aleve/Motrin/Goody Powders/Naproxen/BC powders/Meloxicam/Diclofenac/Indomethacin and other Nonsteroidal anti-inflammatory medications as these will make you more likely to bleed and can cause stomach ulcers, can also cause Kidney problems.    2) complete abstinence from tobacco strongly advised--- you may use nicotine patch to help you quit smoking   3)you need oxygen at home at 2 L via nasal cannula continuously while awake and while asleep--- smoking or having open fires around oxygen can cause fire, significant injury and death   4) please note that there has been several changes to your medications   5) please follow-up with your primary care physician within a week for recheck and reevaluation     Admission Diagnosis  Paroxysmal atrial fibrillation (HCC) [I48.0] PAF (paroxysmal atrial fibrillation) (HCC) [I48.0] COPD exacerbation (HCC) [J44.1]     Discharge Diagnosis  Paroxysmal atrial fibrillation (HCC) [I48.0] PAF (paroxysmal  atrial fibrillation) (HCC) [I48.0] COPD exacerbation (HCC) [J44.1]     Principal Problem:   Acute on chronic respiratory failure with hypoxia (HCC) Active Problems:   COPD exacerbation (HCC)   Paroxysmal atrial fibrillation (HCC)   Cigarette smoker   Tobacco abuse   Chronic respiratory failure with hypoxia (HCC)           Past Medical History:  Diagnosis Date   Asthma     Bronchiolitis     COPD (chronic obstructive pulmonary disease) (HCC)     Emphysema     Emphysema lung (HCC)     Shortness of breath             Past Surgical History:  Procedure Laterality Date   COLONOSCOPY N/A 08/17/2012    Procedure: COLONOSCOPY;  Surgeon: Corbin Adeobert M Rourk, MD;  Location: [facility];  Service: Endoscopy;  Laterality: N/A;  9:30 AM   CYST EXCISION Right      posterior neck- 60 years old       HPI  from the history and physical done on the day of admission:     Chief Complaint: SOB   HPI: Tony [name] is a 60 y.o. male with a history of COPD with chronic respiratory failure on home O2 2 L with ongoing tobacco abuse.  Patient presents with shortness of breath, cough has been worsening over the past couple of days.  Worse with ambulation and improved with rest.  Cough is nonproductive.  Patient came to the hospital for evaluation.  He was noted to be in atrial fibrillation with rapid ventricular rate.  He was given a breathing treatment and magnesium and his heart rate slowed.  Patient's breathing is much improved and is having intermittent atrial fibrillation now.  Has never been noted to have atrial fibrillation.  No fevers, chills, nausea, vomiting.     Review of Systems:  Pt denies any fevers, chills, nausea, vomiting, diarrhea, constipation, abdominal pain, palpitations, headache, vision changes, lightheadedness, dizziness, melena, rectal bleeding.  Review of systems are otherwise negative        Hospital Course:    Brief Summary:-  60 y.o. male with a history of COPD  with chronic respiratory failure on home O2 2 L with ongoing tobacco abuse admitted on 03/03/2020 with acute on chronic hypoxic respiratory failure in the setting of paroxysmal atrial fibrillation with RVR as well as COPD exacerbation and noncompliance with oxygen at home due to the need to continue to smoke   A/p 1) acute on chronic hypoxic respiratory failure--- due to COPD exacerbation and A. fib with RVR --He required up to 4 L of oxygen via nasal cannula,  PTA was on 2 L-- --currently patient is back to 2 L of oxygen which is his baseline -Patient was quite symptomatic with dyspnea, cough, and hypoxia -Treated with IV Solu-Medrol, doxycycline, mucolytics bronchodilators -Overall much improved, okay to discharge home on prednisone and doxycycline   2)Paroxysmal atrial fibrillation with RVR---- -echo with EF of 50 to 55%  -No significant valvular abnormalities  -TSH is 0.614  CHA2DS2- VASc score   is = 0 (zero)   Which is  equal to =0.2  % annual risk of stroke  Pt has No CHF, No DM2. No HTN and pt is 61 yrs old This patients CHA2DS2-VASc Score and unadjusted Ischemic Stroke Rate (% per year) is equal to 0.2 % stroke rate/year from a score of 0   3)Anxiety disorder the patient with history of tobacco and alcohol abuse--- xanax-low-dose as needed -Trazodone nightly -Nicotine patch as ordered Hydroxyzine as needed   4) generalized weakness and deconditioning--PT eval appreciated, no further PT follow-up advised   5) acute COPD exacerbation--- contributing to #1 above -Manage as above #1      04/24/2020  f/u ov/[place] office/Lisle Skillman re: post hosp f/u / resumed smoking  Chief Complaint  Patient presents with   Follow-up    shortness of breath with exertion  Dyspnea:  [employer] nl pace/ on 2lpm  Cough:minimal am  Sleeping: able to lie flat ok one  SABA use: sev times a day hfa/ not using neb 02: 2lpm 24/7  Rec Make sure you check your oxygen saturations at highest level of  activity  The key is to stop smoking completely before smoking completely stops you!    12/11/2020  f/u ov/[place] office/Aubree Doody re: copd GOLD IV/ 02 dep maint on symb 160/spiriva/ Chief Complaint  Patient presents with   Follow-up    Breathing is unchanged since the last visit. He has been coughing up yellow sputum last few days. Using albuterol inhaler about 2 x per day.     Dyspnea:  [employer]/ [employer]  Cough: minimal change / worse in amx 30 min Sleeping: 2lpm/ flat bed 2 pillows  SABA use: not prechallenging or rechallenging 02: 2lpm 24/7  Covid status: vax x 3     No obvious day to day or daytime variability or assoc excess/ purulent sputum or mucus plugs or hemoptysis or cp or chest tightness, subjective wheeze or overt sinus or hb symptoms.   Sleeping  without nocturnal    exacerbation  of respiratory  c/o's or need for noct saba. Also denies any obvious fluctuation of symptoms with weather or environmental changes or other aggravating or alleviating factors except as outlined above   No unusual exposure hx or h/o childhood pna/ asthma or  knowledge of premature birth.  Current Allergies, Complete Past Medical History, Past Surgical History, Family History, and Social History were reviewed in [employer] record.  ROS  The following are not active complaints unless bolded Hoarseness, sore throat, dysphagia, dental problems, itching, sneezing,  nasal congestion or discharge of excess mucus or purulent secretions, ear ache,   fever, chills, sweats, unintended wt loss or wt gain, classically pleuritic or exertional cp,  orthopnea pnd or arm/hand swelling  or leg swelling, presyncope, palpitations, abdominal pain, anorexia, nausea, vomiting, diarrhea  or change in bowel habits or change in bladder habits, change in stools or change in urine, dysuria, hematuria,  rash, arthralgias, visual complaints, headache, numbness, weakness or ataxia or problems with  walking or coordination,  change in mood or  memory.        Current Meds  Medication Sig   albuterol (PROVENTIL) (2.5 MG/3ML) 0.083% nebulizer solution USE 1 VIAL IN NEBULIZER EVERY 4 HOURS AS NEEDED FOR WHEEZING OR SHORTNESS OF BREATH   albuterol (VENTOLIN HFA) 108 (90 Base) MCG/ACT inhaler Inhale 2 puffs into the lungs every 4 (four) hours as needed for wheezing or shortness of breath. Shortness Of Breath   aspirin EC 81 MG tablet Take 1 tablet (81 mg total) by mouth daily with breakfast.   diltiazem (CARDIZEM CD) 120 MG 24 hr capsule Take 1 capsule (120 mg total) by mouth daily.   hydrOXYzine (ATARAX/VISTARIL) 25 MG tablet Take 1 tablet (25 mg total) by mouth 2 (two) times daily as needed for anxiety.   LORazepam (ATIVAN) 0.5 MG tablet Take 0.5 mg by mouth every 6 (six) hours as needed for anxiety.    nicotine (NICODERM CQ - DOSED IN MG/24 HOURS) 14 mg/24hr patch Place 1 patch (14 mg total) onto the skin daily.   OXYGEN O2 2LPM "When I am at home"   Respiratory Therapy Supplies (FLUTTER) DEVI 1 Device by Does not apply route as directed.   SYMBICORT 160-4.5 MCG/ACT inhaler Inhale 2 puffs into the lungs 2 (two) times daily.   Tiotropium Bromide Monohydrate (SPIRIVA RESPIMAT) 2.5 MCG/ACT AERS INHALE 2 SPRAY(S) BY MOUTH ONCE DAILY   traMADol (ULTRAM) 50 MG tablet Take by mouth.   traZODone (DESYREL) 100 MG tablet Take 100 mg by mouth at bedtime.             Objective:   Physical Exam     12/11/2020        122 04/24/2020         119  01/11/2019         115  10/06/2017        113  08/25/2017          114  07/08/2016        116  05/20/16 115 lb 6.4 oz (52.3 kg)  10/29/12 133 lb (60.3 kg)  08/17/12 128 lb (58.1 kg)      Vital signs reviewed  12/11/2020  - Note at rest 02 sats  100% on 2kon   General appearance:    amb wm nad  >> stated age  / congested cough     HEENT : pt wearing mask not removed for exam due to covid -19 concerns.    NECK :  without JVD/Nodes/TM/ nl carotid  upstrokes bilaterally   LUNGS: no acc muscle use,  Mod barrel  contour chest wall with bilateral  Distant bs s audible wheeze and  without cough on insp or exp maneuvers and mod  Hyperresonant  to  percussion bilaterally     CV:  RRR  no s3 or murmur or increase in P2, and no edema   ABD:  soft and nontender with pos mid insp Hoover's  in the supine position. No bruits or organomegaly appreciated, bowel sounds nl  MS:     ext warm without deformities, calf tenderness, cyanosis or - mild  clubbing No obvious joint restrictions   SKIN: warm and dry without lesions    NEURO:  alert, approp, nl sensorium with  no motor or cerebellar deficits apparent.             Assessment:

## 2020-12-12 ENCOUNTER — Encounter: Payer: Self-pay | Admitting: Internal Medicine

## 2020-12-12 NOTE — Assessment & Plan Note (Signed)
Counseled re importance of smoking cessation but did not meet time criteria for separate billing   °

## 2020-12-12 NOTE — Assessment & Plan Note (Addendum)
Active smoker 05/20/2016    try symbicort 160 2bid  - Spirometry 05/20/2016  FEV1 0.83 (24%)  Ratio 45 p am advair / neb   - PFT's  07/08/2016  FEV1 1.05 (29 % ) ratio 41  p 9 % improvement from saba p symb 160 x 2 prior to study with DLCO  50 % corrects to 56  % for alv volume   - 07/08/2016   > try symb 160/spiriva respimat 2.5   X 2 puff each am  - Alpha one AT screen 07/08/2016 >>  MS  Level 127 - 07/11/2017  After extensive coaching inhaler device  effectiveness =    75% from a baseline of 25% (Ti too short) - 07/11/2017 added flutter valve  - PFT's  08/25/2017  FEV1 0.87 (25 % ) ratio 38  p 4 % improvement from saba p symb/spiriva prior to study  - 12/11/2020  After extensive coaching inhaler device,  effectiveness =    75% (short ti)   rec zpak for mild flare bronchitis related to smoking   Group D in terms of symptom/risk and laba/lama/ICS  therefore appropriate rx at this point >>>  Continue symb 160 and spiriva and approp saba  I spent extra time with pt today reviewing appropriate use of albuterol for prn use on exertion with the following points: 1) saba is for relief of sob that does not improve by walking a slower pace or resting but rather if the pt does not improve after trying this first. 2) If the pt is convinced, as many are, that saba helps recover from activity faster then it's easy to tell if this is the case by re-challenging : ie stop, take the inhaler, then p 5 minutes try the exact same activity (intensity of workload) that just caused the symptoms and see if they are substantially diminished or not after saba 3) if there is an activity that reproducibly causes the symptoms, try the saba 15 min before the activity on alternate days   If in fact the saba really does help, then fine to continue to use it prn but advised may need to look closer at the maintenance regimen being used to achieve better control of airways disease with exertion.

## 2020-12-12 NOTE — Assessment & Plan Note (Signed)
Newly placed on 02 at d/c from cap admit  05/31/17 @ 2lpm 24/7  - 07/08/2018   Walked 2lpm poc x one lap =  approx 250 ft - stopped due to "don't want to go farther" but no desat or obvious wob "at the pace he wants to walk" = relatively slow  Again advised: Make sure you check your oxygen saturation  at your highest level of activity  to be sure it stays over 90% and adjust  02 flow upward to maintain this level if needed but remember to turn it back to previous settings when you stop (to conserve your supply).    F/u q 6 m          Each maintenance medication was reviewed in detail including emphasizing most importantly the difference between maintenance and prns and under what circumstances the prns are to be triggered using an action plan format where appropriate.  Total time for H and P, chart review, counseling, reviewing hfa/02 device(s) and generating customized AVS unique to this office visit / same day charting > 30 min

## 2021-08-14 ENCOUNTER — Ambulatory Visit (INDEPENDENT_AMBULATORY_CARE_PROVIDER_SITE_OTHER): Payer: Medicaid Other | Admitting: Internal Medicine

## 2021-08-14 ENCOUNTER — Encounter: Payer: Self-pay | Admitting: Internal Medicine

## 2021-08-14 ENCOUNTER — Other Ambulatory Visit: Payer: Self-pay

## 2021-08-14 DIAGNOSIS — J9611 Chronic respiratory failure with hypoxia: Secondary | ICD-10-CM | POA: Diagnosis not present

## 2021-08-14 DIAGNOSIS — F17210 Nicotine dependence, cigarettes, uncomplicated: Secondary | ICD-10-CM

## 2021-08-14 DIAGNOSIS — J449 Chronic obstructive pulmonary disease, unspecified: Secondary | ICD-10-CM

## 2021-08-14 NOTE — Progress Notes (Signed)
Subjective:     Patient ID: Tony [name], male   DOB: January 09, 1961     MRN: [id number]    Brief patient profile:  48 yowm active smoker dx copd  2014 with onset of doe in 2010 and maint on advair/ albuterol referred to pulmonary clinic 05/20/2016 by [place] by Royce Macadamia, NP with GOLD IV criteria 05/20/16 and MS phenotype with nl alpha one AT levels documented 07/08/16       History of Present Illness  05/20/2016 1st [facility] office visit/ Tony [name]  maint rx advair 250 bid/ neb alb tid and saba hfa prn  Chief Complaint  Patient presents with   Pulmonary Consult    Referred by Royce Macadamia, NP. Pt c/o SOB since 2014, worse "for a while".   He gets winded just walking short distances such as walking to his back yard. He also c/o left side pain. He also c/o cough- prod with thick, clear to white sputum.    doe x MMRC3 = can't walk 100 yards even at a slow pace at a flat grade s stopping due to sob  Without much fluctuation  Has bilateral L CP  24/7 x 3 years but worse with coughing and when lying on L side  rec Plan A = Automatic = Symbicort 160 Take 2 puffs first thing in am and then another 2 puffs about 12 hours later.  Work on inhaler technique:  Plan B = Backup Only use your albuterol as a rescue medication   Plan C = Crisis - only use your albuterol nebulizer if you first try Plan B     Admission date:  03/03/2020    Discharge Date:  03/05/2020   Discharge Diagnosis  Paroxysmal atrial fibrillation (HCC) [I48.0] PAF (paroxysmal atrial fibrillation) (HCC) [I48.0] COPD exacerbation (HCC) [J44.1]   Acute on chronic respiratory failure with hypoxia (HCC) A/p 1) acute on chronic hypoxic respiratory failure--- due to COPD exacerbation and A. fib with RVR --He required up to 4 L of oxygen via nasal cannula,  PTA was on 2 L-- >>>back to 2 L of oxygen which is his baseline    2)Paroxysmal atrial fibrillation with RVR---- -echo with EF of 50 to 55%  -No  significant valvular abnormalities  -TSH is 0.614  CHA2DS2- VASc score   is = 0 (zero)   Which is  equal to =0.2  % annual risk of stroke  Pt has No CHF, No DM2. No HTN and pt is 61 yrs old This patients CHA2DS2-VASc Score and unadjusted Ischemic Stroke Rate (% per year) is equal to 0.2 % stroke rate/year from a score of 0   3)Anxiety disorder the patient with history of tobacco and alcohol abuse--- xanax-low-dose as needed -Trazodone nightly -Nicotine patch as ordered Hydroxyzine as needed   4) generalized weakness and deconditioning--PT eval appreciated, no further PT follow-up advised     12/11/2020  f/u ov/[facility] office/Tony [name] re: copd GOLD IV/ 02 dep maint on symb 160/spiriva/ Chief Complaint  Patient presents with   Follow-up    Breathing is unchanged since the last visit. He has been coughing up yellow sputum last few days. Using albuterol inhaler about 2 x per day.     Dyspnea:  [employer]/ [employer]  Cough: minimal change / worse in amx 30 min Sleeping: 2lpm/ flat bed 2 pillows  SABA use: not prechallenging or rechallenging 02: 2lpm 24/7  Covid status: vax x 3 Rec The key is to stop smoking  completely before smoking completely stops you!  Work on inhaler technique:  Make sure you check your oxygen saturation  at your highest level of activity  to be sure it stays over 90%  Zpak        08/14/2021  f/u ov/[facility] office/Tony [name] re: GOLD 4/ 02 dep  maint on symb/spiriva/02   Chief Complaint  Patient presents with   Follow-up    SOB and cough have not improved.    Dyspnea:  [employer] shopping, pushing cart, HC parking  Also walking in front of house  Cough: minimal / still smoking / rattling mucoid  Sleeping: bed is flat one pillow  SABA use: rarely  02: 2lpm 24/7 does not titrate though has meter  Covid status: max vax per pet  Lung cancer screening > referred today      No obvious day to day or daytime variability or assoc excess/ purulent sputum or mucus  plugs or hemoptysis or cp or chest tightness, subjective wheeze or overt sinus or hb symptoms.   Sleeping  without nocturnal  or early am exacerbation  of respiratory  c/o's or need for noct saba. Also denies any obvious fluctuation of symptoms with weather or environmental changes or other aggravating or alleviating factors except as outlined above   No unusual exposure hx or h/o childhood pna/ asthma or knowledge of premature birth.  Current Allergies, Complete Past Medical History, Past Surgical History, Family History, and Social History were reviewed in [employer] record.  ROS  The following are not active complaints unless bolded Hoarseness, sore throat, dysphagia, dental problems, itching, sneezing,  nasal congestion or discharge of excess mucus or purulent secretions, ear ache,   fever, chills, sweats, unintended wt loss or wt gain, classically pleuritic or exertional cp,  orthopnea pnd or arm/hand swelling  or leg swelling, presyncope, palpitations, abdominal pain, anorexia, nausea, vomiting, diarrhea  or change in bowel habits or change in bladder habits, change in stools or change in urine, dysuria, hematuria,  rash, arthralgias, visual complaints, headache, numbness, weakness or ataxia or problems with walking or coordination,  change in mood or  memory.        Current Meds  Medication Sig   albuterol (PROVENTIL) (2.5 MG/3ML) 0.083% nebulizer solution USE 1 VIAL IN NEBULIZER EVERY 4 HOURS AS NEEDED FOR WHEEZING OR SHORTNESS OF BREATH   albuterol (VENTOLIN HFA) 108 (90 Base) MCG/ACT inhaler Inhale 2 puffs into the lungs every 4 (four) hours as needed for wheezing or shortness of breath. Shortness Of Breath   aspirin EC 81 MG tablet Take 1 tablet (81 mg total) by mouth daily with breakfast.   azithromycin (ZITHROMAX) 250 MG tablet 2 on day one then one daily x 4 days   hydrOXYzine (ATARAX/VISTARIL) 25 MG tablet Take 1 tablet (25 mg total) by mouth 2 (two) times  daily as needed for anxiety.   LORazepam (ATIVAN) 0.5 MG tablet Take 0.5 mg by mouth every 6 (six) hours as needed for anxiety.    OXYGEN O2 2LPM "When I am at home"   Respiratory Therapy Supplies (FLUTTER) DEVI 1 Device by Does not apply route as directed.   SYMBICORT 160-4.5 MCG/ACT inhaler Inhale 2 puffs into the lungs 2 (two) times daily.   Tiotropium Bromide Monohydrate (SPIRIVA RESPIMAT) 2.5 MCG/ACT AERS INHALE 2 SPRAY(S) BY MOUTH ONCE DAILY                  Objective:   Physical Exam   08/14/2021  115   12/11/2020        122 04/24/2020         119  01/11/2019         115  10/06/2017        113  08/25/2017          114  07/08/2016        116  05/20/16 115 lb 6.4 oz (52.3 kg)  10/29/12 133 lb (60.3 kg)  08/17/12 128 lb (58.1 kg)     Vital signs reviewed  08/14/2021  - Note at rest 02 sats  97% on 2lpm pulse   General appearance:    chronically ill thin wm / rattling cough on voluntary maneuver  HEENT : pt wearing mask not removed for exam due to covid -19 concerns.    NECK :  without JVD/Nodes/TM/ nl carotid upstrokes bilaterally   LUNGS: no acc muscle use,  Mod barrel  contour chest wall with bilateral  Distant bs s audible wheeze and  without cough on insp or exp maneuvers and mod  Hyperresonant  to  percussion bilaterally     CV:  RRR  no s3 or murmur or increase in P2, and no edema   ABD:  soft and nontender with pos mid insp Hoover's  in the supine position. No bruits or organomegaly appreciated, bowel sounds nl  MS:     ext warm without deformities, calf tenderness, cyanosis - Mild clubbing No obvious joint restrictions   SKIN: warm and dry without lesions    NEURO:  alert, approp, nl sensorium with  no motor or cerebellar deficits apparent.             Assessment:

## 2021-08-14 NOTE — Patient Instructions (Addendum)
I will be referring you for lung cancer screening - they will call you   We will walk you today to make sure you have enough to stay 90%  Make sure you check your oxygen saturation  AT  your highest level of activity (not after you stop)   to be sure it stays over 90% and adjust  02 flow upward to maintain this level if needed but remember to turn it back to previous settings when you stop (to conserve your supply).   Ok to try albuterol 15 min before an activity (on alternating days)  that you know would usually make you short of breath and see if it makes any difference ( and if makes none then don't take albuterol after activity unless you can't catch your breath as this means it's the resting that helps, not the albuterol.  Please schedule a follow up visit in 6 months but call sooner if needed

## 2021-08-14 NOTE — Assessment & Plan Note (Signed)
Newly placed on 02 at d/c from cap admit  05/31/17 @ 2lpm 24/7  - 07/08/2018   Walked 2lpm poc x one lap =  approx 250 ft - stopped due to "don't want to go farther" but no desat or obvious wob "at the pace he wants to walk" = relatively slow - 08/14/2021   Walked on 2lpm pulse  x  3  lap(s) =  approx 450  ft  @ mod pace, stopped due to end of study with lowest 02 sats 93% with sob on 3rd lap    Again advised: 'Make sure you check your oxygen saturation  AT  your highest level of activity (not after you stop)   to be sure it stays over 90% and adjust  02 flow upward to maintain this level if needed but remember to turn it back to previous settings when you stop (to conserve your supply).

## 2021-08-14 NOTE — Assessment & Plan Note (Signed)
Active smoker 05/20/2016    try symbicort 160 2bid  - Spirometry 05/20/2016  FEV1 0.83 (24%)  Ratio 45 p am advair / neb   - PFT's  07/08/2016  FEV1 1.05 (29 % ) ratio 41  p 9 % improvement from saba p symb 160 x 2 prior to study with DLCO  50 % corrects to 56  % for alv volume   - 07/08/2016   > try symb 160/spiriva respimat 2.5   X 2 puff each am  - Alpha one AT screen 07/08/2016 >>  MS  Level 127 - 07/11/2017  After extensive coaching inhaler device  effectiveness =    75% from a baseline of 25% (Ti too short) - 07/11/2017 added flutter valve  - PFT's  08/25/2017  FEV1 0.87 (25 % ) ratio 38  p 4 % improvement from saba p symb/spiriva prior to study  08/14/2021  After extensive coaching inhaler device,  effectiveness =    80% with hfa    Group D in terms of symptom/risk and laba/lama/ICS  therefore appropriate rx at this point >>>  Symbicort/ spiriva  And approp saba  Re SABA :  I spent extra time with pt today reviewing appropriate use of albuterol for prn use on exertion with the following points: 1) saba is for relief of sob that does not improve by walking a slower pace or resting but rather if the pt does not improve after trying this first. 2) If the pt is convinced, as many are, that saba helps recover from activity faster then it's easy to tell if this is the case by re-challenging : ie stop, take the inhaler, then p 5 minutes try the exact same activity (intensity of workload) that just caused the symptoms and see if they are substantially diminished or not after saba 3) if there is an activity that reproducibly causes the symptoms, try the saba 15 min before the activity on alternate days   If in fact the saba really does help, then fine to continue to use it prn but advised may need to look closer at the maintenance regimen being used to achieve better control of airways disease with exertion.

## 2021-08-14 NOTE — Assessment & Plan Note (Addendum)
Counseled re importance of smoking cessation but did not meet time criteria for separate billing    Low-dose CT lung cancer screening is recommended for patients who are 41-61 years of age with a 20+ pack-year history of smoking and who are currently smoking or quit <=15 years ago. No coughing up blood  No unintentional weight loss of > 15 pounds in the last 6 months  >>>  Referred for screening   Each maintenance medication was reviewed in detail including emphasizing most importantly the difference between maintenance and prns and under what circumstances the prns are to be triggered using an action plan format where appropriate.  Total time for H and P, chart review, counseling, reviewing hfa/smi/02 device(s) , directly observing portions of ambulatory 02 saturation study/ and generating customized AVS unique to this office visit / same day charting  > 30 min

## 2021-11-15 ENCOUNTER — Inpatient Hospital Stay (HOSPITAL_COMMUNITY)
Admission: EM | Admit: 2021-11-15 | Discharge: 2021-11-16 | DRG: 189 | Disposition: A | Discharge status: Home / Self Care | Payer: Medicaid Other | Attending: Family Medicine | Admitting: Family Medicine

## 2021-11-15 ENCOUNTER — Emergency Department (HOSPITAL_COMMUNITY): Payer: Medicaid Other

## 2021-11-15 ENCOUNTER — Encounter (HOSPITAL_COMMUNITY): Payer: Self-pay | Admitting: *Deleted

## 2021-11-15 ENCOUNTER — Other Ambulatory Visit: Payer: Self-pay

## 2021-11-15 DIAGNOSIS — Z825 Family history of asthma and other chronic lower respiratory diseases: Secondary | ICD-10-CM | POA: Diagnosis not present

## 2021-11-15 DIAGNOSIS — J189 Pneumonia, unspecified organism: Secondary | ICD-10-CM

## 2021-11-15 DIAGNOSIS — E43 Unspecified severe protein-calorie malnutrition: Secondary | ICD-10-CM | POA: Diagnosis present

## 2021-11-15 DIAGNOSIS — F1020 Alcohol dependence, uncomplicated: Secondary | ICD-10-CM | POA: Diagnosis present

## 2021-11-15 DIAGNOSIS — E871 Hypo-osmolality and hyponatremia: Secondary | ICD-10-CM

## 2021-11-15 DIAGNOSIS — Z91030 Bee allergy status: Secondary | ICD-10-CM

## 2021-11-15 DIAGNOSIS — R634 Abnormal weight loss: Secondary | ICD-10-CM | POA: Diagnosis not present

## 2021-11-15 DIAGNOSIS — K089 Disorder of teeth and supporting structures, unspecified: Secondary | ICD-10-CM

## 2021-11-15 DIAGNOSIS — R64 Cachexia: Secondary | ICD-10-CM | POA: Diagnosis present

## 2021-11-15 DIAGNOSIS — F17210 Nicotine dependence, cigarettes, uncomplicated: Secondary | ICD-10-CM | POA: Diagnosis present

## 2021-11-15 DIAGNOSIS — Z20822 Contact with and (suspected) exposure to covid-19: Secondary | ICD-10-CM | POA: Diagnosis present

## 2021-11-15 DIAGNOSIS — J9620 Acute and chronic respiratory failure, unspecified whether with hypoxia or hypercapnia: Secondary | ICD-10-CM | POA: Diagnosis present

## 2021-11-15 DIAGNOSIS — I480 Paroxysmal atrial fibrillation: Secondary | ICD-10-CM

## 2021-11-15 DIAGNOSIS — Z9981 Dependence on supplemental oxygen: Secondary | ICD-10-CM | POA: Diagnosis not present

## 2021-11-15 DIAGNOSIS — J449 Chronic obstructive pulmonary disease, unspecified: Secondary | ICD-10-CM | POA: Diagnosis present

## 2021-11-15 DIAGNOSIS — Z681 Body mass index (BMI) 19 or less, adult: Secondary | ICD-10-CM | POA: Diagnosis not present

## 2021-11-15 DIAGNOSIS — D72829 Elevated white blood cell count, unspecified: Secondary | ICD-10-CM | POA: Diagnosis not present

## 2021-11-15 DIAGNOSIS — Z79899 Other long term (current) drug therapy: Secondary | ICD-10-CM | POA: Diagnosis not present

## 2021-11-15 DIAGNOSIS — J441 Chronic obstructive pulmonary disease with (acute) exacerbation: Principal | ICD-10-CM

## 2021-11-15 DIAGNOSIS — Z720 Tobacco use: Secondary | ICD-10-CM | POA: Diagnosis not present

## 2021-11-15 DIAGNOSIS — Z7951 Long term (current) use of inhaled steroids: Secondary | ICD-10-CM

## 2021-11-15 DIAGNOSIS — Z7982 Long term (current) use of aspirin: Secondary | ICD-10-CM | POA: Diagnosis not present

## 2021-11-15 DIAGNOSIS — J439 Emphysema, unspecified: Secondary | ICD-10-CM | POA: Diagnosis present

## 2021-11-15 DIAGNOSIS — R0602 Shortness of breath: Secondary | ICD-10-CM | POA: Diagnosis present

## 2021-11-15 LAB — CBC WITH DIFFERENTIAL/PLATELET
Band Neutrophils: 1 %
Basophils Absolute: 0 10*3/uL (ref 0.0–0.1)
Basophils Relative: 0 %
Eosinophils Absolute: 0 10*3/uL (ref 0.0–0.5)
Eosinophils Relative: 0 %
HCT: 40.7 % (ref 39.0–52.0)
Hemoglobin: 13.5 g/dL (ref 13.0–17.0)
Lymphocytes Relative: 7 %
Lymphs Abs: 1.9 10*3/uL (ref 0.7–4.0)
MCH: 29 pg (ref 26.0–34.0)
MCHC: 33.2 g/dL (ref 30.0–36.0)
MCV: 87.5 fL (ref 80.0–100.0)
Monocytes Absolute: 1.6 10*3/uL — ABNORMAL HIGH (ref 0.1–1.0)
Monocytes Relative: 6 %
Myelocytes: 2 %
Neutro Abs: 22.5 10*3/uL — ABNORMAL HIGH (ref 1.7–7.7)
Neutrophils Relative %: 84 %
Platelets: 343 10*3/uL (ref 150–400)
RBC: 4.65 MIL/uL (ref 4.22–5.81)
RDW: 12.5 % (ref 11.5–15.5)
WBC: 26.5 10*3/uL — ABNORMAL HIGH (ref 4.0–10.5)
nRBC: 0 % (ref 0.0–0.2)

## 2021-11-15 LAB — COMPREHENSIVE METABOLIC PANEL
ALT: 15 U/L (ref 0–44)
AST: 18 U/L (ref 15–41)
Albumin: 4 g/dL (ref 3.5–5.0)
Alkaline Phosphatase: 71 U/L (ref 38–126)
Anion gap: 6 (ref 5–15)
BUN: 21 mg/dL — ABNORMAL HIGH (ref 6–20)
CO2: 29 mmol/L (ref 22–32)
Calcium: 8.7 mg/dL — ABNORMAL LOW (ref 8.9–10.3)
Chloride: 96 mmol/L — ABNORMAL LOW (ref 98–111)
Creatinine, Ser: 1 mg/dL (ref 0.61–1.24)
GFR, Estimated: >60 mL/min (ref >60)
Glucose, Bld: 95 mg/dL (ref 70–99)
Potassium: 3.9 mmol/L (ref 3.5–5.1)
Sodium: 131 mmol/L — ABNORMAL LOW (ref 135–145)
Total Bilirubin: 1 mg/dL (ref 0.3–1.2)
Total Protein: 7.5 g/dL (ref 6.5–8.1)

## 2021-11-15 LAB — SARS CORONAVIRUS 2 BY RT PCR: SARS Coronavirus 2 by RT PCR: NEGATIVE

## 2021-11-15 LAB — BLOOD GAS, VENOUS
Acid-Base Excess: 4.2 mmol/L — ABNORMAL HIGH (ref 0.0–2.0)
Bicarbonate: 31.1 mmol/L — ABNORMAL HIGH (ref 20.0–28.0)
Drawn by: 12816
FIO2: 60 %
O2 Saturation: 59.9 %
Patient temperature: 37
pCO2, Ven: 55 mmHg (ref 44–60)
pH, Ven: 7.36 (ref 7.25–7.43)
pO2, Ven: 35 mmHg (ref 32–45)

## 2021-11-15 LAB — HIV ANTIBODY (ROUTINE TESTING W REFLEX): HIV Screen 4th Generation wRfx: NONREACTIVE

## 2021-11-15 LAB — MRSA NEXT GEN BY PCR, NASAL: MRSA by PCR Next Gen: NOT DETECTED

## 2021-11-15 LAB — TROPONIN I (HIGH SENSITIVITY): Troponin I (High Sensitivity): 3 ng/L (ref <18)

## 2021-11-15 LAB — STREP PNEUMONIAE URINARY ANTIGEN: Strep Pneumo Urinary Antigen: NEGATIVE

## 2021-11-15 MED ORDER — BISACODYL 5 MG PO TBEC: 5.0000 mg | DELAYED_RELEASE_TABLET | Freq: Every day | ORAL | Status: DC | PRN

## 2021-11-15 MED ORDER — METHYLPREDNISOLONE SODIUM SUCC 125 MG IJ SOLR
80.0000 mg | Freq: Two times a day (BID) | INTRAMUSCULAR | Status: AC
  Administered 2021-11-15 – 2021-11-16 (×2): 80 mg via INTRAVENOUS
  Filled 2021-11-15 (×2): qty 2

## 2021-11-15 MED ORDER — PANTOPRAZOLE SODIUM 40 MG PO TBEC
40.0000 mg | DELAYED_RELEASE_TABLET | Freq: Every evening | ORAL | Status: DC
  Administered 2021-11-15: 40 mg via ORAL
  Filled 2021-11-15: qty 1

## 2021-11-15 MED ORDER — CHLORHEXIDINE GLUCONATE CLOTH 2 % EX PADS
6.0000 | MEDICATED_PAD | Freq: Every day | CUTANEOUS | Status: DC
  Administered 2021-11-15: 6 via TOPICAL

## 2021-11-15 MED ORDER — ONDANSETRON HCL 4 MG/2ML IJ SOLN: 4.0000 mg | Freq: Four times a day (QID) | INTRAMUSCULAR | Status: DC | PRN

## 2021-11-15 MED ORDER — LORAZEPAM 0.5 MG PO TABS: 0.5000 mg | ORAL_TABLET | Freq: Four times a day (QID) | ORAL | Status: DC | PRN

## 2021-11-15 MED ORDER — MORPHINE SULFATE (PF) 4 MG/ML IV SOLN
2.0000 mg | Freq: Once | INTRAVENOUS | Status: AC
  Administered 2021-11-15: 2 mg via INTRAVENOUS
  Filled 2021-11-15: qty 1

## 2021-11-15 MED ORDER — ACETAMINOPHEN 325 MG PO TABS: 650.0000 mg | ORAL_TABLET | Freq: Four times a day (QID) | ORAL | Status: DC | PRN

## 2021-11-15 MED ORDER — ALBUTEROL SULFATE (2.5 MG/3ML) 0.083% IN NEBU
2.5000 mg | INHALATION_SOLUTION | Freq: Once | RESPIRATORY_TRACT | Status: AC
  Administered 2021-11-15: 2.5 mg via RESPIRATORY_TRACT

## 2021-11-15 MED ORDER — GUAIFENESIN ER 600 MG PO TB12
1200.0000 mg | ORAL_TABLET | Freq: Two times a day (BID) | ORAL | Status: DC
  Administered 2021-11-15 – 2021-11-16 (×3): 1200 mg via ORAL
  Filled 2021-11-15 (×3): qty 2

## 2021-11-15 MED ORDER — ACETAMINOPHEN 650 MG RE SUPP: 650.0000 mg | Freq: Four times a day (QID) | RECTAL | Status: DC | PRN

## 2021-11-15 MED ORDER — SODIUM CHLORIDE 0.9 % IV SOLN: INTRAVENOUS | Status: DC

## 2021-11-15 MED ORDER — ASPIRIN 81 MG PO TBEC
81.0000 mg | DELAYED_RELEASE_TABLET | Freq: Every day | ORAL | Status: DC
Start: 2021-11-16 — End: 2021-11-16
  Administered 2021-11-16: 81 mg via ORAL
  Filled 2021-11-15: qty 1

## 2021-11-15 MED ORDER — NICOTINE 21 MG/24HR TD PT24
21.0000 mg | MEDICATED_PATCH | Freq: Every day | TRANSDERMAL | Status: DC
  Administered 2021-11-15 – 2021-11-16 (×2): 21 mg via TRANSDERMAL
  Filled 2021-11-15 (×2): qty 1

## 2021-11-15 MED ORDER — DOXYCYCLINE HYCLATE 100 MG PO TABS
100.0000 mg | ORAL_TABLET | Freq: Once | ORAL | Status: AC
  Administered 2021-11-15: 100 mg via ORAL
  Filled 2021-11-15: qty 1

## 2021-11-15 MED ORDER — TRAZODONE HCL 50 MG PO TABS: 50.0000 mg | ORAL_TABLET | Freq: Every evening | ORAL | Status: DC | PRN

## 2021-11-15 MED ORDER — MOMETASONE FURO-FORMOTEROL FUM 200-5 MCG/ACT IN AERO
2.0000 | INHALATION_SPRAY | Freq: Two times a day (BID) | RESPIRATORY_TRACT | Status: DC
Start: 2021-11-15 — End: 2021-11-16
  Administered 2021-11-15 – 2021-11-16 (×2): 2 via RESPIRATORY_TRACT
  Filled 2021-11-15: qty 8.8

## 2021-11-15 MED ORDER — ALBUTEROL SULFATE (2.5 MG/3ML) 0.083% IN NEBU
10.0000 mg/h | INHALATION_SOLUTION | RESPIRATORY_TRACT | Status: AC
  Administered 2021-11-15: 10 mg/h via RESPIRATORY_TRACT
  Filled 2021-11-15: qty 3

## 2021-11-15 MED ORDER — MAGNESIUM SULFATE 2 GM/50ML IV SOLN
2.0000 g | Freq: Once | INTRAVENOUS | Status: AC
  Administered 2021-11-15: 2 g via INTRAVENOUS
  Filled 2021-11-15: qty 50

## 2021-11-15 MED ORDER — IPRATROPIUM-ALBUTEROL 0.5-2.5 (3) MG/3ML IN SOLN
3.0000 mL | Freq: Four times a day (QID) | RESPIRATORY_TRACT | Status: DC
  Administered 2021-11-15 – 2021-11-16 (×3): 3 mL via RESPIRATORY_TRACT
  Filled 2021-11-15 (×3): qty 3

## 2021-11-15 MED ORDER — ONDANSETRON HCL 4 MG PO TABS: 4.0000 mg | ORAL_TABLET | Freq: Four times a day (QID) | ORAL | Status: DC | PRN

## 2021-11-15 MED ORDER — OXYCODONE HCL 5 MG PO TABS
5.0000 mg | ORAL_TABLET | ORAL | Status: DC | PRN
  Administered 2021-11-15 – 2021-11-16 (×2): 5 mg via ORAL
  Filled 2021-11-15 (×2): qty 1

## 2021-11-15 MED ORDER — LORAZEPAM 2 MG/ML IJ SOLN
0.5000 mg | Freq: Once | INTRAMUSCULAR | Status: AC
Start: 2021-11-15 — End: 2021-11-15
  Administered 2021-11-15: 0.5 mg via INTRAVENOUS
  Filled 2021-11-15: qty 1

## 2021-11-15 MED ORDER — ENOXAPARIN SODIUM 40 MG/0.4ML IJ SOSY
40.0000 mg | PREFILLED_SYRINGE | INTRAMUSCULAR | Status: DC
  Administered 2021-11-15: 40 mg via SUBCUTANEOUS
  Filled 2021-11-15: qty 0.4

## 2021-11-15 MED ORDER — IPRATROPIUM-ALBUTEROL 0.5-2.5 (3) MG/3ML IN SOLN
3.0000 mL | Freq: Once | RESPIRATORY_TRACT | Status: AC
  Administered 2021-11-15: 3 mL via RESPIRATORY_TRACT

## 2021-11-15 MED ORDER — SODIUM CHLORIDE 0.9 % IV SOLN
500.0000 mg | INTRAVENOUS | Status: DC
  Administered 2021-11-16: 500 mg via INTRAVENOUS
  Filled 2021-11-15: qty 5

## 2021-11-15 MED ORDER — FENTANYL CITRATE PF 50 MCG/ML IJ SOSY: 12.5000 ug | PREFILLED_SYRINGE | INTRAMUSCULAR | Status: DC | PRN

## 2021-11-15 MED ORDER — METHYLPREDNISOLONE SODIUM SUCC 125 MG IJ SOLR
125.0000 mg | Freq: Once | INTRAMUSCULAR | Status: AC
  Administered 2021-11-15: 125 mg via INTRAVENOUS
  Filled 2021-11-15: qty 2

## 2021-11-15 MED ORDER — SODIUM CHLORIDE 0.9 % IV SOLN
2.0000 g | INTRAVENOUS | Status: DC
  Administered 2021-11-16: 2 g via INTRAVENOUS
  Filled 2021-11-15: qty 20

## 2021-11-15 MED ORDER — DILTIAZEM HCL ER COATED BEADS 120 MG PO CP24
120.0000 mg | ORAL_CAPSULE | Freq: Every day | ORAL | Status: DC
Start: 2021-11-16 — End: 2021-11-16
  Administered 2021-11-16: 120 mg via ORAL
  Filled 2021-11-15: qty 1

## 2021-11-15 MED ORDER — SODIUM CHLORIDE 0.9 % IV SOLN
1.0000 g | Freq: Once | INTRAVENOUS | Status: AC
  Administered 2021-11-15: 1 g via INTRAVENOUS
  Filled 2021-11-15: qty 10

## 2021-11-15 MED ORDER — DEXTROMETHORPHAN POLISTIREX ER 30 MG/5ML PO SUER: 30.0000 mg | Freq: Two times a day (BID) | ORAL | Status: DC | PRN

## 2021-11-15 NOTE — Hospital Course (Addendum)
61 year old gentleman lifelong smoker with stage IV COPD, followed by pulmonologist Dr. Melvyn Novas, presented to ED with left sided pleuritic chest pain and shortness of breath that woke him up from sleeping.  He has had increasing productive cough, shortness of breath and difficulty speaking since this morning.  He did try using nebulizer therapies at home but no significant improvement. He was seen in ED and given nebulizer therapies but had to be placed on bipap due to increasing respiratory distress.  He was sent for CXR with findings of severe COPD and left lower pneumonia.  His WBC was elevated at 26 and he had not been taking steroids prior to arrival.  He was started on antibiotics and steroids and admission to hospital was requested.    11/16/2021:  Pt reporting that he is feeling better and wants to go home today.  No SOB. Back to baseline oxygen requirement.  DC home with close outpatient follow up.

## 2021-11-15 NOTE — Assessment & Plan Note (Signed)
--   He had a CHAD2Vasc score of 0 and had not been anticoagulated -- resume home diltiazem for heart rate control

## 2021-11-15 NOTE — Assessment & Plan Note (Signed)
--   nicotine patch ordered -- smoking cessation counseling ordered

## 2021-11-15 NOTE — Assessment & Plan Note (Addendum)
--   pt is severely emaciated and we are requesting a consultation with dietitian.  -- He will need further outpatient follow up and surveillance

## 2021-11-15 NOTE — Assessment & Plan Note (Signed)
--   he is now requiring bipap therapy  -- he is being admitted to stepdown ICU

## 2021-11-15 NOTE — Assessment & Plan Note (Addendum)
--   secondary to pneumonia and steroids  -- unfortunately blood cultures were not done prior to antibiotics given, I did order for them to be done anyway and will follow them but no growth -- complete course of antibiotics and steroids and then have repeat CBC/diff done on outpatient basis.

## 2021-11-15 NOTE — H&P (Signed)
History and Physical  [place] [id number] DOB: June 04, 1961 DOA: 11/15/2021  PCP: Ralph Leyden, FNP  Patient coming from: Home  Level of care: Stepdown  I have personally briefly reviewed patient's old medical records in [place]  Chief Complaint: chest pain   HPI: Tony [name] is a 61 year old gentleman lifelong smoker with stage IV COPD, followed by pulmonologist Dr. Melvyn Novas, presented to ED with left sided pleuritic chest pain and shortness of breath that woke him up from sleeping.  He has had increasing productive cough, shortness of breath and difficulty speaking since this morning.  He did try using nebulizer therapies at home but no significant improvement. He was seen in ED and given nebulizer therapies but had to be placed on bipap due to increasing respiratory distress.  He was sent for CXR with findings of severe COPD and left lower pneumonia.  His WBC was elevated at 26 and he had not been taking steroids prior to arrival.  He was started on antibiotics and steroids and admission to hospital was requested.    Review of Systems: Review of Systems  Constitutional:  Positive for weight loss. Negative for chills, diaphoresis and fever.  HENT: Negative.    Eyes: Negative.   Respiratory:  Positive for cough, sputum production, shortness of breath and wheezing. Negative for hemoptysis.   Cardiovascular:  Positive for chest pain. Negative for palpitations and leg swelling.  Gastrointestinal: Negative.   Genitourinary: Negative.   Musculoskeletal: Negative.   Skin: Negative.   Neurological: Negative.   Endo/Heme/Allergies: Negative.   Psychiatric/Behavioral: Negative.    All other systems reviewed and are negative.   Past Medical History:  Diagnosis Date   Asthma    Bronchiolitis    COPD (chronic obstructive pulmonary disease) (HCC)    Emphysema    Emphysema lung (HCC)    Shortness of breath     Past Surgical History:  Procedure  Laterality Date   COLONOSCOPY N/A 08/17/2012   Procedure: COLONOSCOPY;  Surgeon: Daneil Dolin, MD;  Location: [facility];  Service: Endoscopy;  Laterality: N/A;  9:30 AM   CYST EXCISION Right    posterior neck- 61 years old     reports that he has been smoking cigarettes. He has a 20.00 pack-year smoking history. He has never used smokeless tobacco. He reports that he does not drink alcohol and does not use drugs.  Allergies  Allergen Reactions   Bee Venom Anaphylaxis    Family History  Problem Relation Age of Onset   Cancer Mother    COPD Brother     Prior to Admission medications   Medication Sig Start Date End Date Taking? Authorizing Provider  albuterol (PROVENTIL) (2.5 MG/3ML) 0.083% nebulizer solution USE 1 VIAL IN NEBULIZER EVERY 4 HOURS AS NEEDED FOR WHEEZING OR SHORTNESS OF BREATH 03/05/20   Emokpae, Courage, MD  albuterol (VENTOLIN HFA) 108 (90 Base) MCG/ACT inhaler Inhale 2 puffs into the lungs every 4 (four) hours as needed for wheezing or shortness of breath. Shortness Of Breath 03/05/20   Roxan Hockey, MD  aspirin EC 81 MG tablet Take 1 tablet (81 mg total) by mouth daily with breakfast. 03/05/20   Roxan Hockey, MD  azithromycin (ZITHROMAX) 250 MG tablet 2 on day one then one daily x 4 days 12/11/20   Tanda Rockers, MD  diltiazem (CARDIZEM CD) 120 MG 24 hr capsule Take 1 capsule (120 mg total) by mouth daily. 03/05/20 03/05/21  Roxan Hockey, MD  hydrOXYzine (ATARAX/VISTARIL) 25 MG tablet Take 1 tablet (25 mg total) by mouth 2 (two) times daily as needed for anxiety. 03/05/20   Roxan Hockey, MD  LORazepam (ATIVAN) 0.5 MG tablet Take 0.5 mg by mouth every 6 (six) hours as needed for anxiety.  04/20/20   [provider]  OXYGEN O2 2LPM "When I am at home"    [provider]  Respiratory Therapy Supplies (FLUTTER) DEVI 1 Device by Does not apply route as directed. 07/11/17   Tanda Rockers, MD  SYMBICORT 160-4.5 MCG/ACT inhaler Inhale 2 puffs  into the lungs 2 (two) times daily. 03/05/20   Roxan Hockey, MD  Tiotropium Bromide Monohydrate (SPIRIVA RESPIMAT) 2.5 MCG/ACT AERS INHALE 2 SPRAY(S) BY MOUTH ONCE DAILY 03/05/20   Roxan Hockey, MD    Physical Exam: Vitals:   11/15/21 1330 11/15/21 1355 11/15/21 1400 11/15/21 1430  BP: 108/74  116/86 117/87  Pulse: 99 92 94 99  Resp: 17 16 19  (!) 21  Temp:      TempSrc:      SpO2: 98% 100% 100% 100%  Weight:      Height:       Constitutional: severely emaciated male on bipap, he is alert and mentating normally. Eyes: PERRL, lids and conjunctivae normal.   ENMT: Mucous membranes are moist. Edentulous.   Neck: normal, supple, no masses, no thyromegaly.  Respiratory: poor air movement. Moderate increased work of breathing.   Cardiovascular: normal s1, s2 sounds, no murmurs / rubs / gallops. No extremity edema. 2+ pedal pulses. No carotid bruits.  Abdomen: no tenderness, no masses palpated. No hepatosplenomegaly. Bowel sounds positive.  Musculoskeletal: no clubbing / cyanosis. No joint deformity upper and lower extremities. Good ROM, no contractures. Normal muscle tone.  Skin: no rashes, lesions, ulcers. No induration Neurologic: CN 2-12 grossly intact. Sensation intact, DTR normal. Strength 5/5 in all 4.  Psychiatric: poor judgment and insight. Alert and oriented x 3. Normal mood.   Labs on Admission: I have personally reviewed following labs and imaging studies  CBC: Recent Labs  Lab 11/15/21 1220  WBC 26.5*  NEUTROABS 22.5*  HGB 13.5  HCT 40.7  MCV 87.5  PLT [id number]   Basic Metabolic Panel: Recent Labs  Lab 11/15/21 1220  NA 131*  K 3.9  CL 96*  CO2 29  GLUCOSE 95  BUN 21*  CREATININE 1.00  CALCIUM 8.7*   GFR: Estimated Creatinine Clearance: 58 mL/min (by C-G formula based on SCr of 1 mg/dL). Liver Function Tests: Recent Labs  Lab 11/15/21 1220  AST 18  ALT 15  ALKPHOS 71  BILITOT 1.0  PROT 7.5  ALBUMIN 4.0   No results for input(s): LIPASE, AMYLASE  in the last 168 hours. No results for input(s): AMMONIA in the last 168 hours. Coagulation Profile: No results for input(s): INR, PROTIME in the last 168 hours. Cardiac Enzymes: No results for input(s): CKTOTAL, CKMB, CKMBINDEX, TROPONINI in the last 168 hours. BNP (last 3 results) No results for input(s): PROBNP in the last 8760 hours. HbA1C: No results for input(s): HGBA1C in the last 72 hours. CBG: No results for input(s): GLUCAP in the last 168 hours. Lipid Profile: No results for input(s): CHOL, HDL, LDLCALC, TRIG, CHOLHDL, LDLDIRECT in the last 72 hours. Thyroid Function Tests: No results for input(s): TSH, T4TOTAL, FREET4, T3FREE, THYROIDAB in the last 72 hours. Anemia Panel: No results for input(s): VITAMINB12, FOLATE, FERRITIN, TIBC, IRON, RETICCTPCT in the last 72 hours. Urine analysis:  Component Value Date/Time   COLORURINE YELLOW 03/02/2010 0852   APPEARANCEUR CLEAR 03/02/2010 0852   LABSPEC 1.005 03/02/2010 0852   PHURINE 6.5 03/02/2010 0852   GLUCOSEU NEGATIVE 03/02/2010 0852   HGBUR SMALL (A) 03/02/2010 0852   BILIRUBINUR NEGATIVE 03/02/2010 0852   KETONESUR NEGATIVE 03/02/2010 0852   PROTEINUR NEGATIVE 03/02/2010 0852   UROBILINOGEN 0.2 03/02/2010 0852   NITRITE NEGATIVE 03/02/2010 0852   LEUKOCYTESUR TRACE (A) 03/02/2010 0852    Radiological Exams on Admission: DG Chest Portable 1 View  Result Date: 11/15/2021 CLINICAL DATA:  COPD shortness of breath, left chest pain EXAM: PORTABLE CHEST 1 VIEW COMPARISON:  03/03/2020 FINDINGS: Cardiac size is within normal limits. There are no signs of alveolar pulmonary edema. Low position of diaphragms suggests COPD. Emphysematous changes are noted in both lungs. There is small focus of subtle increase in interstitial markings in the lateral aspect of left mid lung fields. There is no focal pulmonary consolidation. There is no significant pleural effusion or pneumothorax. IMPRESSION: Severe COPD. There is new small focus  of increased interstitial markings in the lateral left mid lung fields which may suggest scarring or interstitial pneumonia. There is no focal pulmonary consolidation. There is no pleural effusion or pneumothorax. Electronically Signed   By: Elmer Picker M.D.   On: 11/15/2021 12:36     Assessment/Plan Principal Problem:   Acute and chronic respiratory failure (acute-on-chronic) (HCC) Active Problems:   COPD GOLD IV / still smoking    Tobacco abuse   Abnormal weight loss   COPD exacerbation (HCC)   CAP (community acquired pneumonia)   Alcohol dependence ([place])   Poor dentition   Paroxysmal atrial fibrillation (HCC)   Hyponatremia   Leukocytosis   Severe protein-calorie malnutrition (HCC)    Assessment and Plan: * Acute and chronic respiratory failure (acute-on-chronic) (HCC) -- he is now requiring bipap therapy  -- he is being admitted to stepdown ICU   Severe protein-calorie malnutrition ([place]) -- consultation to dietitian requested   Leukocytosis -- secondary to pneumonia -- unfortunately blood cultures were not done prior to antibiotics given, I did order for them to be done anyway and will follow them   Hyponatremia -- appears severely emaciated and malnourished -- Query SIADH and worry about possible occult malignancy -- he will need further outpatient follow up and surveillance  -- treating with IV fluid for now and recheck labs in AM   Paroxysmal atrial fibrillation ([place]) -- He had a CHAD2Vasc score of 0 and had not been anticoagulated -- resume home diltiazem for heart rate control   Poor dentition -- poor overall health -- consult to dietitian -- soft food diet ordered   Alcohol dependence ([place]) -- reports no recent alcohol use -- monitor for s/s of alcohol withdrawal   CAP (community acquired pneumonia) -- left lower lobe disease, treating with broad spectrum antibiotic coverage -- checking procalcitonin -- checking strep pneum and legionella  urinary antigens -- supplement oxygen -- mucinex ordered BID -- delsym for cough ordered   COPD exacerbation ([place]) -- severe disease, on bipap, continue for now -- admit to stepdown ICU -- continue IV steroids, antibiotics and scheduled bronchodilators  Abnormal weight loss -- pt is severely emaciated and we are requesting a consultation with dietitian  Tobacco abuse -- nicotine patch ordered -- smoking cessation counseling ordered  COPD GOLD IV / still smoking  -- Pt lifelong active smoker but now says he wants to quit.  -- He is high risk for decompensation and  intubation -- bronchodilators as ordered  -- may need to get pulmonology involved depending on clinical course  Critical Care Procedure Note Authorized and Performed by: Murvin Natal MD  Total Critical Care time:  65 mins Due to a high probability of clinically significant, life threatening deterioration, the patient required my highest level of preparedness to intervene emergently and I personally spent this critical care time directly and personally managing the patient.  This critical care time included obtaining a history; examining the patient, pulse oximetry; ordering and review of studies; arranging urgent treatment with development of a management plan; evaluation of patient's response of treatment; frequent reassessment; and discussions with other providers.  This critical care time was performed to assess and manage the high probability of imminent and life threatening deterioration that could result in multi-organ failure.  It was exclusive of separately billable procedures and treating other patients and teaching time.    DVT prophylaxis: enoxaparin   Code Status: full   Family Communication:   Disposition Plan: TBD   Consults called: dietitian   Admission status: INP  Level of care: Stepdown Irwin Brakeman MD [facility] How to contact the [facility] Attending or Consulting provider [place] or covering  provider during after hours [place], for this patient?  Check the care team in [facility] and look for a) attending/consulting [facility] provider listed and b) the [facility] team listed Log into [email or web address] and use [place]'s universal password to access. If you do not have the password, please contact the hospital operator. Locate the [facility] provider you are looking for under [facility] and page to a number that you can be directly reached. If you still have difficulty reaching the provider, please page the [facility] (Director on Call) for the Hospitalists listed on amion for assistance.   If 7PM-7AM, please contact night-coverage [email or web address] Password [facility]  11/15/2021, 4:29 PM

## 2021-11-15 NOTE — Assessment & Plan Note (Signed)
--   consultation to dietitian requested

## 2021-11-15 NOTE — Assessment & Plan Note (Signed)
--   poor overall health -- consult to dietitian -- soft food diet ordered

## 2021-11-15 NOTE — ED Triage Notes (Signed)
BIB [place] for respiratory distress, sob and wheezing onset this am, also reports L rib pain.

## 2021-11-15 NOTE — ED Notes (Signed)
Resting comfortably on CAT neb and Bipap, pain decreased, NAD, calm, interactive.

## 2021-11-15 NOTE — Assessment & Plan Note (Addendum)
--   appears severely emaciated and malnourished -- Query SIADH and worry about possible occult malignancy -- he will need further outpatient follow up and surveillance  -- treating with IV fluid AND RESOLVED

## 2021-11-15 NOTE — ED Notes (Signed)
Xray at [facility] 

## 2021-11-15 NOTE — ED Notes (Signed)
Neb in progress, xray ordered.

## 2021-11-15 NOTE — ED Provider Notes (Signed)
[facility] EMERGENCY DEPARTMENT Provider Note   CSN: [id number] Arrival date & time: 11/15/21  1159     History  Chief Complaint  Patient presents with   Shortness of Breath    Tony [name] is a 61 y.o. male with history of COPD, emphysema, on 2 L nasal cannula baseline, presenting to ED with shortness of breath and left-sided chest pain.  Patient ports he woke up with symptoms this morning, pleuritic left lower sided chest pain, coughing, difficulty breathing.  He tried a nebulizer treatment which did not get better.  EMS gave the patient additional nebulizers, no steroids.  He still feels like he is working hard to breathe, and his wife at bedside agrees.  He reports he is a lifelong smoker, but states he has now quit smoking as of this morning.  He has not been on steroids recently.  He was admitted for COPD exacerbation back in September of last year, per my review of the medical records.   HPI     Home Medications Prior to Admission medications   Medication Sig Start Date End Date Taking? Authorizing Provider  albuterol (PROVENTIL) (2.5 MG/3ML) 0.083% nebulizer solution USE 1 VIAL IN NEBULIZER EVERY 4 HOURS AS NEEDED FOR WHEEZING OR SHORTNESS OF BREATH 03/05/20   Emokpae, Courage, MD  albuterol (VENTOLIN HFA) 108 (90 Base) MCG/ACT inhaler Inhale 2 puffs into the lungs every 4 (four) hours as needed for wheezing or shortness of breath. Shortness Of Breath 03/05/20   Shon Hale, MD  aspirin EC 81 MG tablet Take 1 tablet (81 mg total) by mouth daily with breakfast. 03/05/20   Shon Hale, MD  azithromycin (ZITHROMAX) 250 MG tablet 2 on day one then one daily x 4 days 12/11/20   Nyoka Cowden, MD  diltiazem (CARDIZEM CD) 120 MG 24 hr capsule Take 1 capsule (120 mg total) by mouth daily. 03/05/20 03/05/21  Shon Hale, MD  hydrOXYzine (ATARAX/VISTARIL) 25 MG tablet Take 1 tablet (25 mg total) by mouth 2 (two) times daily as needed for anxiety. 03/05/20   Shon Hale, MD  LORazepam (ATIVAN) 0.5 MG tablet Take 0.5 mg by mouth every 6 (six) hours as needed for anxiety.  04/20/20   [provider]  OXYGEN O2 2LPM "When I am at home"    [provider]  Respiratory Therapy Supplies (FLUTTER) DEVI 1 Device by Does not apply route as directed. 07/11/17   Nyoka Cowden, MD  SYMBICORT 160-4.5 MCG/ACT inhaler Inhale 2 puffs into the lungs 2 (two) times daily. 03/05/20   Shon Hale, MD  Tiotropium Bromide Monohydrate (SPIRIVA RESPIMAT) 2.5 MCG/ACT AERS INHALE 2 SPRAY(S) BY MOUTH ONCE DAILY 03/05/20   Shon Hale, MD      Allergies    Bee venom    Review of Systems   Review of Systems  Physical Exam Updated Vital Signs BP 117/87   Pulse 99   Temp 98.5 F (36.9 C) (Oral)   Resp (!) 21   Ht 5\' 6"  (1.676 m)   Wt 52.2 kg   SpO2 100%   BMI 18.56 kg/m  Physical Exam Constitutional:      General: He is not in acute distress.    Comments: Thin, frail  HENT:     Head: Normocephalic and atraumatic.  Eyes:     Conjunctiva/sclera: Conjunctivae normal.     Pupils: Pupils are equal, round, and reactive to light.  Cardiovascular:     Rate and Rhythm: Normal rate and regular  rhythm.  Pulmonary:     Effort: Pulmonary effort is normal. No respiratory distress.     Comments: Inspiratory and expiratory wheezing Subcostal retractions, speaking in short sentences Abdominal:     General: There is no distension.     Tenderness: There is no abdominal tenderness.  Skin:    General: Skin is warm and dry.  Neurological:     General: No focal deficit present.     Mental Status: He is alert. Mental status is at baseline.  Psychiatric:        Mood and Affect: Mood normal.        Behavior: Behavior normal.    ED Results / Procedures / Treatments   Labs (all labs ordered are listed, but only abnormal results are displayed) Labs Reviewed  COMPREHENSIVE METABOLIC PANEL - Abnormal; Notable for the following components:      Result  Value   Sodium 131 (*)    Chloride 96 (*)    BUN 21 (*)    Calcium 8.7 (*)    All other components within normal limits  CBC WITH DIFFERENTIAL/PLATELET - Abnormal; Notable for the following components:   WBC 26.5 (*)    Neutro Abs 22.5 (*)    Monocytes Absolute 1.6 (*)    All other components within normal limits  SARS CORONAVIRUS 2 BY RT PCR  CULTURE, BLOOD (ROUTINE X 2)  CULTURE, BLOOD (ROUTINE X 2)  EXPECTORATED SPUTUM ASSESSMENT W GRAM STAIN, RFLX TO RESP C  BLOOD GAS, VENOUS  HIV ANTIBODY (ROUTINE TESTING W REFLEX)  LEGIONELLA PNEUMOPHILA SEROGP 1 UR AG  STREP PNEUMONIAE URINARY ANTIGEN  I-STAT VENOUS BLOOD GAS, ED  TROPONIN I (HIGH SENSITIVITY)    EKG EKG Interpretation  Date/Time:  Thursday Nov 15 2021 12:06:14 EDT Ventricular Rate:  104 PR Interval:  141 QRS Duration: 122 QT Interval:  339 QTC Calculation: 446 R Axis:   71 Text Interpretation: Sinus tachycardia Nonspecific intraventricular conduction delay Confirmed by Alvester Chou [phone]) on 11/15/2021 12:46:20 PM  Radiology DG Chest Portable 1 View  Result Date: 11/15/2021 CLINICAL DATA:  COPD shortness of breath, left chest pain EXAM: PORTABLE CHEST 1 VIEW COMPARISON:  03/03/2020 FINDINGS: Cardiac size is within normal limits. There are no signs of alveolar pulmonary edema. Low position of diaphragms suggests COPD. Emphysematous changes are noted in both lungs. There is small focus of subtle increase in interstitial markings in the lateral aspect of left mid lung fields. There is no focal pulmonary consolidation. There is no significant pleural effusion or pneumothorax. IMPRESSION: Severe COPD. There is new small focus of increased interstitial markings in the lateral left mid lung fields which may suggest scarring or interstitial pneumonia. There is no focal pulmonary consolidation. There is no pleural effusion or pneumothorax. Electronically Signed   By: Ernie Avena M.D.   On: 11/15/2021 12:36     Procedures .Critical Care Performed by: Terald Sleeper, MD Authorized by: Terald Sleeper, MD   Critical care provider statement:    Critical care time (minutes):  45   Critical care time was exclusive of:  Separately billable procedures and treating other patients   Critical care was necessary to treat or prevent imminent or life-threatening deterioration of the following conditions:  Respiratory failure   Critical care was time spent personally by me on the following activities:  Ordering and performing treatments and interventions, ordering and review of laboratory studies, ordering and review of radiographic studies, pulse oximetry, review of old charts, examination of patient  and evaluation of patient's response to treatment   Care discussed with: admitting provider   Comments:     COPD exacerbation requiring BiPAP, IV treatment, management of pneumonia    Medications Ordered in ED Medications  albuterol (PROVENTIL) (2.5 MG/3ML) 0.083% nebulizer solution (10 mg/hr Nebulization New Bag/Given 11/15/21 1354)  methylPREDNISolone sodium succinate (SOLU-MEDROL) 125 mg/2 mL injection 80 mg (has no administration in time range)  cefTRIAXone (ROCEPHIN) 2 g in sodium chloride 0.9 % 100 mL IVPB (has no administration in time range)  azithromycin (ZITHROMAX) 500 mg in sodium chloride 0.9 % 250 mL IVPB (has no administration in time range)  ipratropium-albuterol (DUONEB) 0.5-2.5 (3) MG/3ML nebulizer solution 3 mL (3 mLs Nebulization Not Given 11/15/21 1503)  enoxaparin (LOVENOX) injection 40 mg (has no administration in time range)  0.9 %  sodium chloride infusion (has no administration in time range)  acetaminophen (TYLENOL) tablet 650 mg (has no administration in time range)    Or  acetaminophen (TYLENOL) suppository 650 mg (has no administration in time range)  guaiFENesin (MUCINEX) 12 hr tablet 1,200 mg (has no administration in time range)  pantoprazole (PROTONIX) EC tablet 40 mg (has  no administration in time range)  oxyCODONE (Oxy IR/ROXICODONE) immediate release tablet 5 mg (has no administration in time range)  fentaNYL (SUBLIMAZE) injection 12.5 mcg (has no administration in time range)  dextromethorphan (DELSYM) 30 MG/5ML liquid 30 mg (has no administration in time range)  traZODone (DESYREL) tablet 50 mg (has no administration in time range)  bisacodyl (DULCOLAX) EC tablet 5 mg (has no administration in time range)  ondansetron (ZOFRAN) tablet 4 mg (has no administration in time range)    Or  ondansetron (ZOFRAN) injection 4 mg (has no administration in time range)  nicotine (NICODERM CQ - dosed in mg/24 hours) patch 21 mg (has no administration in time range)  LORazepam (ATIVAN) tablet 0.5 mg (has no administration in time range)  mometasone-formoterol (DULERA) 200-5 MCG/ACT inhaler 2 puff (has no administration in time range)  diltiazem (CARDIZEM CD) 24 hr capsule 120 mg (has no administration in time range)  ipratropium-albuterol (DUONEB) 0.5-2.5 (3) MG/3ML nebulizer solution 3 mL (3 mLs Nebulization Given 11/15/21 1204)  albuterol (PROVENTIL) (2.5 MG/3ML) 0.083% nebulizer solution 2.5 mg (2.5 mg Nebulization Given 11/15/21 1203)  cefTRIAXone (ROCEPHIN) 1 g in sodium chloride 0.9 % 100 mL IVPB (0 g Intravenous Stopped 11/15/21 1433)  methylPREDNISolone sodium succinate (SOLU-MEDROL) 125 mg/2 mL injection 125 mg (125 mg Intravenous Given 11/15/21 1338)  magnesium sulfate IVPB 2 g 50 mL (0 g Intravenous Stopped 11/15/21 1526)  doxycycline (VIBRA-TABS) tablet 100 mg (100 mg Oral Given 11/15/21 1338)  morphine (PF) 4 MG/ML injection 2 mg (2 mg Intravenous Given 11/15/21 1400)  LORazepam (ATIVAN) injection 0.5 mg (0.5 mg Intravenous Given 11/15/21 1400)    ED Course/ Medical Decision Making/ A&P Clinical Course as of 11/15/21 1626  Thu Nov 15, 2021  1403 On bipap, morphine and small dose ativan (low dose both) ordered for chest pain and anxiety - do not want to oversedate  [MT]  1433 Breathing stabilized and improved on BiPAP, work of breathing improved.  I believe he is reasonably stable for a stepdown admission at this time [MT]  1443 Admitted to hospitalist [MT]    Clinical Course User Index [MT] Tam Savoia, Kermit Balo, MD                           Medical Decision Making Amount  and/or Complexity of Data Reviewed Labs: ordered. Radiology: ordered.  Risk Prescription drug management. Decision regarding hospitalization.   This patient presents to the ED with concern for shortness of breath, left-sided chest pain. This involves an extensive number of treatment options, and is a complaint that carries with it a high risk of complications and morbidity.  The differential diagnosis includes pneumonia versus COPD exacerbation versus pneumothorax versus other  Co-morbidities that complicate the patient evaluation: History of smoking and emphysema raises risk for lung infections and COPD exacerbation  Additional history obtained from EMS and patient's wife  External records from outside source obtained and reviewed including hospitalization for COPD exacerbation last year as noted above  I ordered and personally interpreted labs.  The pertinent results include: Significant leukocytosis white blood cell count 26.5.  BMP shows some mild hyponatremia, no AKI.  Troponin is 3 and unremarkable, doubt ACS.  Patient is pending a COVID swab as well as a venous gas.  I ordered imaging studies including x-ray of the chest I independently visualized and interpreted imaging which showed left lower lobe infiltrate I agree with the radiologist interpretation  The patient was maintained on a cardiac monitor.  I personally viewed and interpreted the cardiac monitored which showed an underlying rhythm of: Sinus tachycardia  Per my interpretation the patient's ECG shows sinus tachycardia  I ordered medication including IV steroids, IV magnesium, nebulizers, IV antibiotics for  community pneumonia and COPD exacerbation.  BiPAP for respiratory distress I have reviewed the patients home medicines and have made adjustments as needed  Test Considered: Lower suspicion for acute pulmonary embolism, I do not feel a CT PE indicated at this time  After the interventions noted above, I reevaluated the patient and found that they have: improved  Social Determinants of Health:Patient counseled on smoking cessation  Dispostion:  After consideration of the diagnostic results and the patients response to treatment, I feel that the patent would benefit from medical admission         Final Clinical Impression(s) / ED Diagnoses Final diagnoses:  COPD exacerbation (HCC)  Pneumonia of left lower lobe due to infectious organism    Rx / DC Orders ED Discharge Orders     None         Vernis Cabacungan, Kermit BaloMatthew J, MD 11/15/21 1626

## 2021-11-15 NOTE — ED Notes (Signed)
Remains on Bipap, alert, NAD, calm, interactive. Med rec tech at [facility]

## 2021-11-15 NOTE — Assessment & Plan Note (Signed)
--   reports no recent alcohol use -- monitor for s/s of alcohol withdrawal

## 2021-11-15 NOTE — Assessment & Plan Note (Addendum)
--   left lower lobe disease, treated with broad spectrum antibiotic coverage -- supplement oxygen -- mucinex ordered BID -- delsym for cough ordered  -- DC on oral doxycycline to complete course

## 2021-11-15 NOTE — Assessment & Plan Note (Addendum)
--   Pt lifelong active smoker but now says he wants to quit.  -- He is high risk for decompensation and intubation -- bronchodilators as ordered  -- He is feeling better and wants to go home today. DC home

## 2021-11-15 NOTE — ED Notes (Signed)
Family at [facility]. EDP into room, at [facility].

## 2021-11-15 NOTE — Assessment & Plan Note (Addendum)
--   severe disease, on bipap, continue for now -- admitted to stepdown ICU but rapidly came off bipap -- He was treated with IV steroids, antibiotics and scheduled bronchodilators -- He is feeling better and asking to go home today.  DC  Home with close outpatient follow up

## 2021-11-15 NOTE — ED Notes (Signed)
RT called/ notified of orders

## 2021-11-16 DIAGNOSIS — J9620 Acute and chronic respiratory failure, unspecified whether with hypoxia or hypercapnia: Secondary | ICD-10-CM | POA: Diagnosis not present

## 2021-11-16 DIAGNOSIS — F1020 Alcohol dependence, uncomplicated: Secondary | ICD-10-CM

## 2021-11-16 DIAGNOSIS — J449 Chronic obstructive pulmonary disease, unspecified: Secondary | ICD-10-CM

## 2021-11-16 DIAGNOSIS — R634 Abnormal weight loss: Secondary | ICD-10-CM | POA: Diagnosis not present

## 2021-11-16 DIAGNOSIS — J189 Pneumonia, unspecified organism: Secondary | ICD-10-CM | POA: Diagnosis not present

## 2021-11-16 LAB — BASIC METABOLIC PANEL
Anion gap: 6 (ref 5–15)
BUN: 14 mg/dL (ref 6–20)
CO2: 27 mmol/L (ref 22–32)
Calcium: 8.7 mg/dL — ABNORMAL LOW (ref 8.9–10.3)
Chloride: 103 mmol/L (ref 98–111)
Creatinine, Ser: 0.64 mg/dL (ref 0.61–1.24)
GFR, Estimated: >60 mL/min (ref >60)
Glucose, Bld: 262 mg/dL — ABNORMAL HIGH (ref 70–99)
Potassium: 3.9 mmol/L (ref 3.5–5.1)
Sodium: 136 mmol/L (ref 135–145)

## 2021-11-16 LAB — LEGIONELLA PNEUMOPHILA SEROGP 1 UR AG: L. pneumophila Serogp 1 Ur Ag: NEGATIVE

## 2021-11-16 LAB — CBC WITH DIFFERENTIAL/PLATELET
Abs Immature Granulocytes: 0.21 10*3/uL — ABNORMAL HIGH (ref 0.00–0.07)
Basophils Absolute: 0 10*3/uL (ref 0.0–0.1)
Basophils Relative: 0 %
Eosinophils Absolute: 0 10*3/uL (ref 0.0–0.5)
Eosinophils Relative: 0 %
HCT: 35.9 % — ABNORMAL LOW (ref 39.0–52.0)
Hemoglobin: 11.9 g/dL — ABNORMAL LOW (ref 13.0–17.0)
Immature Granulocytes: 1 %
Lymphocytes Relative: 2 %
Lymphs Abs: 0.6 10*3/uL — ABNORMAL LOW (ref 0.7–4.0)
MCH: 29 pg (ref 26.0–34.0)
MCHC: 33.1 g/dL (ref 30.0–36.0)
MCV: 87.6 fL (ref 80.0–100.0)
Monocytes Absolute: 0.7 10*3/uL (ref 0.1–1.0)
Monocytes Relative: 3 %
Neutro Abs: 21.6 10*3/uL — ABNORMAL HIGH (ref 1.7–7.7)
Neutrophils Relative %: 94 %
Platelets: 282 10*3/uL (ref 150–400)
RBC: 4.1 MIL/uL — ABNORMAL LOW (ref 4.22–5.81)
RDW: 12.5 % (ref 11.5–15.5)
WBC: 23.2 10*3/uL — ABNORMAL HIGH (ref 4.0–10.5)
nRBC: 0 % (ref 0.0–0.2)

## 2021-11-16 LAB — HEMOGLOBIN A1C
Hgb A1c MFr Bld: 5.4 % (ref 4.8–5.6)
Mean Plasma Glucose: 108.28 mg/dL

## 2021-11-16 LAB — MAGNESIUM: Magnesium: 2.2 mg/dL (ref 1.7–2.4)

## 2021-11-16 LAB — GLUCOSE, CAPILLARY
Glucose-Capillary: 189 mg/dL — ABNORMAL HIGH (ref 70–99)
Glucose-Capillary: 209 mg/dL — ABNORMAL HIGH (ref 70–99)

## 2021-11-16 MED ORDER — DOXYCYCLINE HYCLATE 100 MG PO CAPS: 100.0000 mg | ORAL_CAPSULE | Freq: Two times a day (BID) | ORAL | 0 refills | Status: AC

## 2021-11-16 MED ORDER — INSULIN ASPART 100 UNIT/ML IJ SOLN: 0.0000 [IU] | Freq: Three times a day (TID) | INTRAMUSCULAR | Status: DC

## 2021-11-16 MED ORDER — GUAIFENESIN ER 600 MG PO TB12: 600.0000 mg | ORAL_TABLET | Freq: Two times a day (BID) | ORAL | 0 refills | Status: AC

## 2021-11-16 MED ORDER — INSULIN ASPART 100 UNIT/ML IJ SOLN
3.0000 [IU] | Freq: Three times a day (TID) | INTRAMUSCULAR | Status: DC
  Administered 2021-11-16 (×2): 3 [IU] via SUBCUTANEOUS

## 2021-11-16 MED ORDER — PREDNISONE 20 MG PO TABS: ORAL_TABLET | ORAL | 0 refills | Status: AC

## 2021-11-16 MED ORDER — OXYCODONE HCL 5 MG PO TABS: 5.0000 mg | ORAL_TABLET | Freq: Three times a day (TID) | ORAL | 0 refills | Status: AC | PRN

## 2021-11-16 MED ORDER — INSULIN ASPART 100 UNIT/ML IJ SOLN: 3.0000 [IU] | Freq: Three times a day (TID) | INTRAMUSCULAR | Status: DC

## 2021-11-16 MED ORDER — INSULIN ASPART 100 UNIT/ML IJ SOLN
0.0000 [IU] | Freq: Three times a day (TID) | INTRAMUSCULAR | Status: DC
  Administered 2021-11-16: 3 [IU] via SUBCUTANEOUS
  Administered 2021-11-16: 2 [IU] via SUBCUTANEOUS

## 2021-11-16 MED ORDER — INSULIN ASPART 100 UNIT/ML IJ SOLN: 0.0000 [IU] | Freq: Every day | INTRAMUSCULAR | Status: DC

## 2021-11-16 MED ORDER — ALBUTEROL SULFATE HFA 108 (90 BASE) MCG/ACT IN AERS: 2.0000 | INHALATION_SPRAY | RESPIRATORY_TRACT | 3 refills | Status: AC | PRN

## 2021-11-16 MED ORDER — ALBUTEROL SULFATE (2.5 MG/3ML) 0.083% IN NEBU: INHALATION_SOLUTION | RESPIRATORY_TRACT | 2 refills | Status: AC

## 2021-11-16 NOTE — Discharge Instructions (Signed)
IMPORTANT INFORMATION: PAY CLOSE ATTENTION   PHYSICIAN DISCHARGE INSTRUCTIONS  Follow with Primary care provider  Golden Pop, FNP  and other consultants as instructed by your Hospitalist Physician  SEEK MEDICAL CARE OR RETURN TO EMERGENCY ROOM IF SYMPTOMS COME BACK, WORSEN OR NEW PROBLEM DEVELOPS   Please note: You were cared for by a hospitalist during your hospital stay. Every effort will be made to forward records to your primary care provider.  You can request that your primary care provider send for your hospital records if they have not received them.  Once you are discharged, your primary care physician will handle any further medical issues. Please note that NO REFILLS for any discharge medications will be authorized once you are discharged, as it is imperative that you return to your primary care physician (or establish a relationship with a primary care physician if you do not have one) for your post hospital discharge needs so that they can reassess your need for medications and monitor your lab values.  Please get a complete blood count and chemistry panel checked by your Primary MD at your next visit, and again as instructed by your Primary MD.  Get Medicines reviewed and adjusted: Please take all your medications with you for your next visit with your Primary MD  Laboratory/radiological data: Please request your Primary MD to go over all hospital tests and procedure/radiological results at the follow up, please ask your primary care provider to get all Hospital records sent to his/her office.  In some cases, they will be blood work, cultures and biopsy results pending at the time of your discharge. Please request that your primary care provider follow up on these results.  If you are diabetic, please bring your blood sugar readings with you to your follow up appointment with primary care.    Please call and make your follow up appointments as soon as possible.    Also  Note the following: If you experience worsening of your admission symptoms, develop shortness of breath, life threatening emergency, suicidal or homicidal thoughts you must seek medical attention immediately by calling 911 or calling your MD immediately  if symptoms less severe.  You must read complete instructions/literature along with all the possible adverse reactions/side effects for all the Medicines you take and that have been prescribed to you. Take any new Medicines after you have completely understood and accpet all the possible adverse reactions/side effects.   Do not drive when taking Pain medications or sleeping medications (Benzodiazepines)  Do not take more than prescribed Pain, Sleep and Anxiety Medications. It is not advisable to combine anxiety,sleep and pain medications without talking with your primary care practitioner  Special Instructions: If you have smoked or chewed Tobacco  in the last 2 yrs please stop smoking, stop any regular Alcohol  and or any Recreational drug use.  Wear Seat belts while driving.  Do not drive if taking any narcotic, mind altering or controlled substances or recreational drugs or alcohol.

## 2021-11-16 NOTE — Discharge Summary (Signed)
Physician Discharge Summary  Tony [name] [id number] DOB: July 28, 1960 DOA: 11/15/2021  PCP: Ralph Leyden, FNP  Admit date: 11/15/2021 Discharge date: 11/16/2021  Admitted From:  Home  Disposition: Home   Recommendations for Outpatient Follow-up:  Follow up with PCP in 2 weeks Please obtain CBC in 2 weeks to follow up WBC Please consider outpatient work up for abnormal weight loss  Discharge Condition: STABLE   CODE STATUS: FULL DIET: regular    Brief Hospitalization Summary: Please see all hospital notes, images, labs for full details of the hospitalization. 61 year old gentleman lifelong smoker with stage IV COPD, followed by pulmonologist Dr. Melvyn Novas, presented to ED with left sided pleuritic chest pain and shortness of breath that woke him up from sleeping.  He has had increasing productive cough, shortness of breath and difficulty speaking since this morning.  He did try using nebulizer therapies at home but no significant improvement. He was seen in ED and given nebulizer therapies but had to be placed on bipap due to increasing respiratory distress.  He was sent for CXR with findings of severe COPD and left lower pneumonia.  His WBC was elevated at 26 and he had not been taking steroids prior to arrival.  He was started on antibiotics and steroids and admission to hospital was requested.    11/16/2021:  Pt reporting that he is feeling better and wants to go home today.  No SOB. Back to baseline oxygen requirement.  DC home with close outpatient follow up.    HOSPITAL COURSE BY PROBLEM LIST  Assessment and Plan: * Acute and chronic respiratory failure (acute-on-chronic) (HCC) -- he is now requiring bipap therapy  -- he is being admitted to stepdown ICU   Severe protein-calorie malnutrition ([place]) -- consultation to dietitian requested   Leukocytosis -- secondary to pneumonia and steroids  -- unfortunately blood cultures were not done prior to antibiotics given, I did  order for them to be done anyway and will follow them but no growth -- complete course of antibiotics and steroids and then have repeat CBC/diff done on outpatient basis.    Hyponatremia -- appears severely emaciated and malnourished -- Query SIADH and worry about possible occult malignancy -- he will need further outpatient follow up and surveillance  -- treating with IV fluid AND RESOLVED    Paroxysmal atrial fibrillation ([place]) -- He had a CHAD2Vasc score of 0 and had not been anticoagulated -- resume home diltiazem for heart rate control   Poor dentition -- poor overall health -- consult to dietitian -- soft food diet ordered   Alcohol dependence ([place]) -- reports no recent alcohol use -- monitor for s/s of alcohol withdrawal   CAP (community acquired pneumonia) -- left lower lobe disease, treated with broad spectrum antibiotic coverage -- supplement oxygen -- mucinex ordered BID -- delsym for cough ordered  -- DC on oral doxycycline to complete course   COPD exacerbation (HCC) -- severe disease, on bipap, continue for now -- admitted to stepdown ICU but rapidly came off bipap -- He was treated with IV steroids, antibiotics and scheduled bronchodilators -- He is feeling better and asking to go home today.  DC  Home with close outpatient follow up  Abnormal weight loss -- pt is severely emaciated and we are requesting a consultation with dietitian.  -- He will need further outpatient follow up and surveillance   Tobacco abuse -- nicotine patch ordered -- smoking cessation counseling ordered  COPD GOLD IV / still  smoking  -- Pt lifelong active smoker but now says he wants to quit.  -- He is high risk for decompensation and intubation -- bronchodilators as ordered  -- He is feeling better and wants to go home today. DC home   Discharge Diagnoses:  Principal Problem:   Acute and chronic respiratory failure (acute-on-chronic) (HCC) Active Problems:   COPD GOLD IV /  still smoking    Tobacco abuse   Abnormal weight loss   COPD exacerbation (HCC)   CAP (community acquired pneumonia)   Alcohol dependence ([place])   Poor dentition   Paroxysmal atrial fibrillation (HCC)   Hyponatremia   Leukocytosis   Severe protein-calorie malnutrition ([place])   Discharge Instructions:  Allergies as of 11/16/2021       Reactions   Bee Venom Anaphylaxis        Medication List     STOP taking these medications    azithromycin 250 MG tablet Commonly known as: ZITHROMAX   Dilt-XR 120 MG 24 hr capsule Generic drug: diltiazem   ipratropium-albuterol 0.5-2.5 (3) MG/3ML Soln Commonly known as: DUONEB       TAKE these medications    albuterol 108 (90 Base) MCG/ACT inhaler Commonly known as: VENTOLIN HFA Inhale 2 puffs into the lungs every 4 (four) hours as needed for wheezing or shortness of breath. Shortness Of Breath   albuterol (2.5 MG/3ML) 0.083% nebulizer solution Commonly known as: PROVENTIL USE 1 VIAL IN NEBULIZER EVERY 4 HOURS AS NEEDED FOR WHEEZING OR SHORTNESS OF BREATH   aspirin EC 81 MG tablet Take 1 tablet (81 mg total) by mouth daily with breakfast.   diltiazem 120 MG 24 hr capsule Commonly known as: Cardizem CD Take 1 capsule (120 mg total) by mouth daily.   doxycycline 100 MG capsule Commonly known as: VIBRAMYCIN Take 1 capsule (100 mg total) by mouth 2 (two) times daily for 4 days.   Flutter Devi 1 Device by Does not apply route as directed.   guaiFENesin 600 MG 12 hr tablet Commonly known as: MUCINEX Take 1 tablet (600 mg total) by mouth 2 (two) times daily for 3 days.   hydrOXYzine 25 MG tablet Commonly known as: ATARAX Take 1 tablet (25 mg total) by mouth 2 (two) times daily as needed for anxiety.   LORazepam 0.5 MG tablet Commonly known as: ATIVAN Take 0.5 mg by mouth every 6 (six) hours as needed for anxiety.   oxyCODONE 5 MG immediate release tablet Commonly known as: Oxy IR/ROXICODONE Take 1 tablet (5 mg total)  by mouth 3 (three) times daily as needed for up to 3 days for severe pain.   OXYGEN O2 2LPM "When I am at home"   predniSONE 20 MG tablet Commonly known as: DELTASONE Take 3 PO QAM x5days, 2 PO QAM x5days, 1 PO QAM x5days Start taking on: Nov 17, 2021   Spiriva Respimat 2.5 MCG/ACT Aers Generic drug: Tiotropium Bromide Monohydrate INHALE 2 SPRAY(S) BY MOUTH ONCE DAILY   Symbicort 160-4.5 MCG/ACT inhaler Generic drug: budesonide-formoterol Inhale 2 puffs into the lungs 2 (two) times daily.        Follow-up Information     Ralph Leyden, FNP. Schedule an appointment as soon as possible for a visit in 1 week(s).   Specialty: Family Medicine Why: Hospital Follow Up Contact information: [place] [address] [phone]                Allergies  Allergen Reactions   Bee Venom Anaphylaxis  Allergies as of 11/16/2021       Reactions   Bee Venom Anaphylaxis        Medication List     STOP taking these medications    azithromycin 250 MG tablet Commonly known as: ZITHROMAX   Dilt-XR 120 MG 24 hr capsule Generic drug: diltiazem   ipratropium-albuterol 0.5-2.5 (3) MG/3ML Soln Commonly known as: DUONEB       TAKE these medications    albuterol 108 (90 Base) MCG/ACT inhaler Commonly known as: VENTOLIN HFA Inhale 2 puffs into the lungs every 4 (four) hours as needed for wheezing or shortness of breath. Shortness Of Breath   albuterol (2.5 MG/3ML) 0.083% nebulizer solution Commonly known as: PROVENTIL USE 1 VIAL IN NEBULIZER EVERY 4 HOURS AS NEEDED FOR WHEEZING OR SHORTNESS OF BREATH   aspirin EC 81 MG tablet Take 1 tablet (81 mg total) by mouth daily with breakfast.   diltiazem 120 MG 24 hr capsule Commonly known as: Cardizem CD Take 1 capsule (120 mg total) by mouth daily.   doxycycline 100 MG capsule Commonly known as: VIBRAMYCIN Take 1 capsule (100 mg total) by mouth 2 (two) times daily for 4 days.   Flutter Devi 1 Device by  Does not apply route as directed.   guaiFENesin 600 MG 12 hr tablet Commonly known as: MUCINEX Take 1 tablet (600 mg total) by mouth 2 (two) times daily for 3 days.   hydrOXYzine 25 MG tablet Commonly known as: ATARAX Take 1 tablet (25 mg total) by mouth 2 (two) times daily as needed for anxiety.   LORazepam 0.5 MG tablet Commonly known as: ATIVAN Take 0.5 mg by mouth every 6 (six) hours as needed for anxiety.   oxyCODONE 5 MG immediate release tablet Commonly known as: Oxy IR/ROXICODONE Take 1 tablet (5 mg total) by mouth 3 (three) times daily as needed for up to 3 days for severe pain.   OXYGEN O2 2LPM "When I am at home"   predniSONE 20 MG tablet Commonly known as: DELTASONE Take 3 PO QAM x5days, 2 PO QAM x5days, 1 PO QAM x5days Start taking on: Nov 17, 2021   Spiriva Respimat 2.5 MCG/ACT Aers Generic drug: Tiotropium Bromide Monohydrate INHALE 2 SPRAY(S) BY MOUTH ONCE DAILY   Symbicort 160-4.5 MCG/ACT inhaler Generic drug: budesonide-formoterol Inhale 2 puffs into the lungs 2 (two) times daily.        Procedures/Studies: DG Chest Portable 1 View  Result Date: 11/15/2021 CLINICAL DATA:  COPD shortness of breath, left chest pain EXAM: PORTABLE CHEST 1 VIEW COMPARISON:  03/03/2020 FINDINGS: Cardiac size is within normal limits. There are no signs of alveolar pulmonary edema. Low position of diaphragms suggests COPD. Emphysematous changes are noted in both lungs. There is small focus of subtle increase in interstitial markings in the lateral aspect of left mid lung fields. There is no focal pulmonary consolidation. There is no significant pleural effusion or pneumothorax. IMPRESSION: Severe COPD. There is new small focus of increased interstitial markings in the lateral left mid lung fields which may suggest scarring or interstitial pneumonia. There is no focal pulmonary consolidation. There is no pleural effusion or pneumothorax. Electronically Signed   By: Elmer Picker M.D.   On: 11/15/2021 12:36     Subjective: Pt says he is going home today.  No SOB.  Feels better and much less coughing.  No chest pain.    Discharge Exam: Vitals:   11/16/21 0900 11/16/21 1124  BP: 127/76   Pulse: 83  Resp: 10   Temp:  97.6 F (36.4 C)  SpO2: 100%    Vitals:   11/16/21 0746 11/16/21 0800 11/16/21 0900 11/16/21 1124  BP:  (!) 146/90 127/76   Pulse:  85 83   Resp:  (!) 22 10   Temp:    97.6 F (36.4 C)  TempSrc:    Oral  SpO2: 100% 99% 100%   Weight:      Height:       General: Pt is alert, awake, not in acute distress Cardiovascular: normal S1/S2 +, no rubs, no gallops Respiratory: CTA bilaterally, no wheezing, no rhonchi Abdominal: Soft, NT, ND, bowel sounds + Extremities: no edema, no cyanosis   The results of significant diagnostics from this hospitalization (including imaging, microbiology, ancillary and laboratory) are listed below for reference.     Microbiology: Recent Results (from the past 240 hour(s))  SARS Coronavirus 2 by RT PCR (hospital order, performed in [facility] hospital lab) *cepheid single result test* Anterior Nasal Swab     Status: None   Collection Time: 11/15/21  1:29 PM   Specimen: Anterior Nasal Swab  Result Value Ref Range Status   SARS Coronavirus 2 by RT PCR NEGATIVE NEGATIVE Final    Comment: (NOTE) SARS-CoV-2 target nucleic acids are NOT DETECTED.  The SARS-CoV-2 RNA is generally detectable in upper and lower respiratory specimens during the acute phase of infection. The lowest concentration of SARS-CoV-2 viral copies this assay can detect is 250 copies / mL. A negative result does not preclude SARS-CoV-2 infection and should not be used as the sole basis for treatment or other patient management decisions.  A negative result may occur with improper specimen collection / handling, submission of specimen other than nasopharyngeal swab, presence of viral mutation(s) within the areas targeted by this  assay, and inadequate number of viral copies (<250 copies / mL). A negative result must be combined with clinical observations, patient history, and epidemiological information.  Fact Sheet for Patients:   https://www.patel.info/  Fact Sheet for Healthcare Providers: https://hall.com/  This test is not yet approved or  cleared by the [place] FDA and has been authorized for detection and/or diagnosis of SARS-CoV-2 by FDA under an Emergency Use Authorization (EUA).  This EUA will remain in effect (meaning this test can be used) for the duration of the COVID-19 declaration under Section 564(b)(1) of the Act, 21 U.S.C. section 360bbb-3(b)(1), unless the authorization is terminated or revoked sooner.  Performed at [facility], [address] [place], [place] [address]   Culture, blood (Routine X 2) w Reflex to ID Panel     Status: None (Preliminary result)   Collection Time: 11/15/21  3:19 PM   Specimen: BLOOD  Result Value Ref Range Status   Specimen Description BLOOD RIGHT ANTECUBITAL  Final   Special Requests   Final    BOTTLES DRAWN AEROBIC AND ANAEROBIC Blood Culture adequate volume   Culture   Final    NO GROWTH < 12 HOURS Performed at [facility], [address] [place], [place] [address]    Report Status PENDING  Incomplete  Culture, blood (Routine X 2) w Reflex to ID Panel     Status: None (Preliminary result)   Collection Time: 11/15/21  3:28 PM   Specimen: BLOOD  Result Value Ref Range Status   Specimen Description BLOOD BLOOD RIGHT ARM  Final   Special Requests   Final    BOTTLES DRAWN AEROBIC AND ANAEROBIC Blood Culture adequate volume  Culture   Final    NO GROWTH < 12 HOURS Performed at [facility], [address] [place], [place] [address]    Report Status PENDING  Incomplete  MRSA Next Gen by PCR, Nasal     Status: None   Collection Time: 11/15/21  6:00 PM   Specimen: Nasal Mucosa; Nasal Swab  Result Value  Ref Range Status   MRSA by PCR Next Gen NOT DETECTED NOT DETECTED Final    Comment: (NOTE) The GeneXpert MRSA Assay (FDA approved for NASAL specimens only), is one component of a comprehensive MRSA colonization surveillance program. It is not intended to diagnose MRSA infection nor to guide or monitor treatment for MRSA infections. Test performance is not FDA approved in patients less than 34 years old. Performed at [facility], [address], [place], [place] [address]      Labs: BNP (last 3 results) No results for input(s): BNP in the last 8760 hours. Basic Metabolic Panel: Recent Labs  Lab 11/15/21 1220 11/16/21 0421  NA 131* 136  K 3.9 3.9  CL 96* 103  CO2 29 27  GLUCOSE 95 262*  BUN 21* 14  CREATININE 1.00 0.64  CALCIUM 8.7* 8.7*  MG  --  2.2   Liver Function Tests: Recent Labs  Lab 11/15/21 1220  AST 18  ALT 15  ALKPHOS 71  BILITOT 1.0  PROT 7.5  ALBUMIN 4.0   No results for input(s): LIPASE, AMYLASE in the last 168 hours. No results for input(s): AMMONIA in the last 168 hours. CBC: Recent Labs  Lab 11/15/21 1220 11/16/21 0421  WBC 26.5* 23.2*  NEUTROABS 22.5* 21.6*  HGB 13.5 11.9*  HCT 40.7 35.9*  MCV 87.5 87.6  PLT 343 282   Cardiac Enzymes: No results for input(s): CKTOTAL, CKMB, CKMBINDEX, TROPONINI in the last 168 hours. BNP: Invalid input(s): POCBNP CBG: Recent Labs  Lab 11/16/21 0741 11/16/21 1122  GLUCAP 189* 209*   D-Dimer No results for input(s): DDIMER in the last 72 hours. Hgb A1c Recent Labs    11/16/21 0423  HGBA1C 5.4   Lipid Profile No results for input(s): CHOL, HDL, LDLCALC, TRIG, CHOLHDL, LDLDIRECT in the last 72 hours. Thyroid function studies No results for input(s): TSH, T4TOTAL, T3FREE, THYROIDAB in the last 72 hours.  Invalid input(s): FREET3 Anemia work up No results for input(s): VITAMINB12, FOLATE, FERRITIN, TIBC, IRON, RETICCTPCT in the last 72 hours. Urinalysis    Component Value Date/Time    COLORURINE YELLOW 03/02/2010 0852   APPEARANCEUR CLEAR 03/02/2010 0852   LABSPEC 1.005 03/02/2010 0852   PHURINE 6.5 03/02/2010 0852   GLUCOSEU NEGATIVE 03/02/2010 0852   HGBUR SMALL (A) 03/02/2010 0852   BILIRUBINUR NEGATIVE 03/02/2010 0852   KETONESUR NEGATIVE 03/02/2010 0852   PROTEINUR NEGATIVE 03/02/2010 0852   UROBILINOGEN 0.2 03/02/2010 0852   NITRITE NEGATIVE 03/02/2010 0852   LEUKOCYTESUR TRACE (A) 03/02/2010 0852   Sepsis Labs Invalid input(s): PROCALCITONIN,  WBC,  LACTICIDVEN Microbiology Recent Results (from the past 240 hour(s))  SARS Coronavirus 2 by RT PCR (hospital order, performed in [place] hospital lab) *cepheid single result test* Anterior Nasal Swab     Status: None   Collection Time: 11/15/21  1:29 PM   Specimen: Anterior Nasal Swab  Result Value Ref Range Status   SARS Coronavirus 2 by RT PCR NEGATIVE NEGATIVE Final    Comment: (NOTE) SARS-CoV-2 target nucleic acids are NOT DETECTED.  The SARS-CoV-2 RNA is generally detectable in upper and lower respiratory specimens during  the acute phase of infection. The lowest concentration of SARS-CoV-2 viral copies this assay can detect is 250 copies / mL. A negative result does not preclude SARS-CoV-2 infection and should not be used as the sole basis for treatment or other patient management decisions.  A negative result may occur with improper specimen collection / handling, submission of specimen other than nasopharyngeal swab, presence of viral mutation(s) within the areas targeted by this assay, and inadequate number of viral copies (<250 copies / mL). A negative result must be combined with clinical observations, patient history, and epidemiological information.  Fact Sheet for Patients:   https://www.patel.info/  Fact Sheet for Healthcare Providers: https://hall.com/  This test is not yet approved or  cleared by the [place] FDA and has been authorized  for detection and/or diagnosis of SARS-CoV-2 by FDA under an Emergency Use Authorization (EUA).  This EUA will remain in effect (meaning this test can be used) for the duration of the COVID-19 declaration under Section 564(b)(1) of the Act, 21 U.S.C. section 360bbb-3(b)(1), unless the authorization is terminated or revoked sooner.  Performed at [facility], [address], [place], [place] [address]   Culture, blood (Routine X 2) w Reflex to ID Panel     Status: None (Preliminary result)   Collection Time: 11/15/21  3:19 PM   Specimen: BLOOD  Result Value Ref Range Status   Specimen Description BLOOD RIGHT ANTECUBITAL  Final   Special Requests   Final    BOTTLES DRAWN AEROBIC AND ANAEROBIC Blood Culture adequate volume   Culture   Final    NO GROWTH < 12 HOURS Performed at [facility], [address], [place], [place] [address]    Report Status PENDING  Incomplete  Culture, blood (Routine X 2) w Reflex to ID Panel     Status: None (Preliminary result)   Collection Time: 11/15/21  3:28 PM   Specimen: BLOOD  Result Value Ref Range Status   Specimen Description BLOOD BLOOD RIGHT ARM  Final   Special Requests   Final    BOTTLES DRAWN AEROBIC AND ANAEROBIC Blood Culture adequate volume   Culture   Final    NO GROWTH < 12 HOURS Performed at [facility], [address] [place], [place] [address]    Report Status PENDING  Incomplete  MRSA Next Gen by PCR, Nasal     Status: None   Collection Time: 11/15/21  6:00 PM   Specimen: Nasal Mucosa; Nasal Swab  Result Value Ref Range Status   MRSA by PCR Next Gen NOT DETECTED NOT DETECTED Final    Comment: (NOTE) The GeneXpert MRSA Assay (FDA approved for NASAL specimens only), is one component of a comprehensive MRSA colonization surveillance program. It is not intended to diagnose MRSA infection nor to guide or monitor treatment for MRSA infections. Test performance is not FDA approved in patients less than 63 years old. Performed at  [facility], [address] [place], [place] [address]    Time coordinating discharge: 36 mins   SIGNED:  Irwin Brakeman, MD  [facility] 11/16/2021, 1:16 PM How to contact the [facility] Attending or Consulting provider [place] or covering provider during after hours [place], for this patient?  Check the care team in [facility] and look for a) attending/consulting TR[facility] provider listed and b) the [facility] team listed Log into [email or web address] and use [place]'s universal password to access. If you do not have the password, please contact the hospital operator. Locate the [facility]  provider you are looking for under [facility] and page to a number that you can be directly reached. If you still have difficulty reaching the provider, please page the [facility] (Director on Call) for the Hospitalists listed on amion for assistance.

## 2021-11-16 NOTE — Progress Notes (Signed)
  Transition of Care [facility]) Screening Note   Patient Details  Name: Tony [name] Date of Birth: 08/22/1960   Transition of Care [facility]) CM/SW Contact:    Villa Herb, LCSWA Phone Number: 11/16/2021, 11:35 AM    Transition of Care Department [facility]) has reviewed patient and no TOC needs have been identified at this time. We will continue to monitor patient advancement through interdisciplinary progression rounds. If new patient transition needs arise, please place a TOC consult.

## 2021-11-20 LAB — CULTURE, BLOOD (ROUTINE X 2)
Culture: NO GROWTH
Culture: NO GROWTH
Special Requests: ADEQUATE
Special Requests: ADEQUATE

## 2022-02-07 ENCOUNTER — Ambulatory Visit: Payer: Medicaid Other | Admitting: Internal Medicine

## 2022-07-25 ENCOUNTER — Encounter: Payer: Self-pay | Admitting: *Deleted

## 2023-12-24 ENCOUNTER — Encounter (INDEPENDENT_AMBULATORY_CARE_PROVIDER_SITE_OTHER): Payer: Self-pay | Admitting: *Deleted

## 2024-06-29 ENCOUNTER — Encounter (INDEPENDENT_AMBULATORY_CARE_PROVIDER_SITE_OTHER): Payer: Self-pay | Admitting: *Deleted
